# Patient Record
Sex: Female | Born: 1955 | Race: White | Hispanic: No | Marital: Married | Smoking: Never smoker
Health system: Southern US, Community
[De-identification: ages and names within clinical notes are randomized; demographics above are authoritative.]

## PROBLEM LIST (undated history)

## (undated) DIAGNOSIS — K5792 Diverticulitis of intestine, part unspecified, without perforation or abscess without bleeding: Secondary | ICD-10-CM

## (undated) DIAGNOSIS — E871 Hypo-osmolality and hyponatremia: Secondary | ICD-10-CM

## (undated) DIAGNOSIS — E2740 Unspecified adrenocortical insufficiency: Secondary | ICD-10-CM

## (undated) DIAGNOSIS — I10 Essential (primary) hypertension: Secondary | ICD-10-CM

## (undated) DIAGNOSIS — R1310 Dysphagia, unspecified: Secondary | ICD-10-CM

## (undated) DIAGNOSIS — Z8719 Personal history of other diseases of the digestive system: Secondary | ICD-10-CM

## (undated) DIAGNOSIS — K219 Gastro-esophageal reflux disease without esophagitis: Secondary | ICD-10-CM

## (undated) HISTORY — PX: CHOLECYSTECTOMY: SHX55

## (undated) HISTORY — PX: ABDOMINAL HYSTERECTOMY: SHX81

## (undated) HISTORY — DX: Personal history of other diseases of the digestive system: Z87.19

## (undated) HISTORY — PX: TUBAL LIGATION: SHX77

## (undated) HISTORY — DX: Gastro-esophageal reflux disease without esophagitis: K21.9

## (undated) HISTORY — PX: APPENDECTOMY: SHX54

---

## 1994-07-15 HISTORY — PX: SPINAL CORD DECOMPRESSION: SHX97

## 1998-01-09 ENCOUNTER — Other Ambulatory Visit: Admission: RE | Admit: 1998-01-09 | Discharge: 1998-01-09 | Payer: Self-pay | Admitting: Obstetrics and Gynecology

## 1998-08-08 ENCOUNTER — Encounter: Payer: Self-pay | Admitting: Emergency Medicine

## 1998-08-08 ENCOUNTER — Inpatient Hospital Stay (HOSPITAL_COMMUNITY): Admission: EM | Admit: 1998-08-08 | Discharge: 1998-08-14 | Payer: Self-pay | Admitting: Emergency Medicine

## 1998-08-09 ENCOUNTER — Encounter: Payer: Self-pay | Admitting: Family Medicine

## 1998-08-09 ENCOUNTER — Encounter: Payer: Self-pay | Admitting: Emergency Medicine

## 1998-08-12 ENCOUNTER — Encounter: Payer: Self-pay | Admitting: Family Medicine

## 1998-08-16 ENCOUNTER — Encounter: Admission: RE | Admit: 1998-08-16 | Discharge: 1998-08-16 | Payer: Self-pay | Admitting: Family Medicine

## 1999-06-07 ENCOUNTER — Emergency Department (HOSPITAL_COMMUNITY): Admission: EM | Admit: 1999-06-07 | Discharge: 1999-06-07 | Payer: Self-pay | Admitting: Emergency Medicine

## 1999-06-07 ENCOUNTER — Encounter: Payer: Self-pay | Admitting: Emergency Medicine

## 1999-10-23 ENCOUNTER — Encounter: Payer: Self-pay | Admitting: Obstetrics and Gynecology

## 1999-10-23 ENCOUNTER — Ambulatory Visit (HOSPITAL_COMMUNITY): Admission: RE | Admit: 1999-10-23 | Discharge: 1999-10-23 | Payer: Self-pay | Admitting: Obstetrics and Gynecology

## 1999-11-09 ENCOUNTER — Encounter (INDEPENDENT_AMBULATORY_CARE_PROVIDER_SITE_OTHER): Payer: Self-pay | Admitting: Specialist

## 1999-11-09 ENCOUNTER — Other Ambulatory Visit: Admission: RE | Admit: 1999-11-09 | Discharge: 1999-11-09 | Payer: Self-pay | Admitting: Obstetrics and Gynecology

## 2000-02-05 ENCOUNTER — Ambulatory Visit (HOSPITAL_COMMUNITY): Admission: RE | Admit: 2000-02-05 | Discharge: 2000-02-05 | Payer: Self-pay | Admitting: Obstetrics and Gynecology

## 2000-02-05 ENCOUNTER — Encounter (INDEPENDENT_AMBULATORY_CARE_PROVIDER_SITE_OTHER): Payer: Self-pay | Admitting: Specialist

## 2000-04-01 ENCOUNTER — Inpatient Hospital Stay (HOSPITAL_COMMUNITY): Admission: RE | Admit: 2000-04-01 | Discharge: 2000-04-03 | Payer: Self-pay | Admitting: Gynecology

## 2000-04-01 ENCOUNTER — Encounter (INDEPENDENT_AMBULATORY_CARE_PROVIDER_SITE_OTHER): Payer: Self-pay

## 2001-11-15 ENCOUNTER — Encounter: Payer: Self-pay | Admitting: Emergency Medicine

## 2001-11-15 ENCOUNTER — Encounter: Payer: Self-pay | Admitting: General Surgery

## 2001-11-15 ENCOUNTER — Emergency Department (HOSPITAL_COMMUNITY): Admission: EM | Admit: 2001-11-15 | Discharge: 2001-11-15 | Payer: Self-pay | Admitting: Emergency Medicine

## 2001-11-25 ENCOUNTER — Other Ambulatory Visit: Admission: RE | Admit: 2001-11-25 | Discharge: 2001-11-25 | Payer: Self-pay | Admitting: Gynecology

## 2001-11-30 ENCOUNTER — Encounter: Payer: Self-pay | Admitting: Gynecology

## 2001-11-30 ENCOUNTER — Encounter: Admission: RE | Admit: 2001-11-30 | Discharge: 2001-11-30 | Payer: Self-pay | Admitting: Gynecology

## 2001-12-01 ENCOUNTER — Encounter: Payer: Self-pay | Admitting: Gynecology

## 2001-12-01 ENCOUNTER — Encounter: Admission: RE | Admit: 2001-12-01 | Discharge: 2001-12-01 | Payer: Self-pay | Admitting: Gynecology

## 2002-05-17 ENCOUNTER — Encounter: Payer: Self-pay | Admitting: Emergency Medicine

## 2002-05-17 ENCOUNTER — Emergency Department (HOSPITAL_COMMUNITY): Admission: EM | Admit: 2002-05-17 | Discharge: 2002-05-17 | Payer: Self-pay | Admitting: Emergency Medicine

## 2003-05-05 ENCOUNTER — Emergency Department (HOSPITAL_COMMUNITY): Admission: EM | Admit: 2003-05-05 | Discharge: 2003-05-05 | Payer: Self-pay | Admitting: Emergency Medicine

## 2004-10-01 ENCOUNTER — Other Ambulatory Visit: Admission: RE | Admit: 2004-10-01 | Discharge: 2004-10-01 | Payer: Self-pay | Admitting: Obstetrics and Gynecology

## 2004-10-01 ENCOUNTER — Encounter: Admission: RE | Admit: 2004-10-01 | Discharge: 2004-10-01 | Payer: Self-pay | Admitting: Family Medicine

## 2005-05-09 ENCOUNTER — Ambulatory Visit: Payer: Self-pay | Admitting: Family Medicine

## 2005-05-18 ENCOUNTER — Emergency Department (HOSPITAL_COMMUNITY): Admission: EM | Admit: 2005-05-18 | Discharge: 2005-05-18 | Payer: Self-pay | Admitting: Emergency Medicine

## 2005-05-20 ENCOUNTER — Ambulatory Visit: Payer: Self-pay | Admitting: Family Medicine

## 2005-10-14 ENCOUNTER — Ambulatory Visit: Payer: Self-pay | Admitting: Family Medicine

## 2005-12-19 ENCOUNTER — Ambulatory Visit: Payer: Self-pay | Admitting: Family Medicine

## 2005-12-24 ENCOUNTER — Ambulatory Visit: Payer: Self-pay | Admitting: Family Medicine

## 2005-12-25 ENCOUNTER — Ambulatory Visit: Payer: Self-pay | Admitting: Family Medicine

## 2010-09-20 ENCOUNTER — Emergency Department (HOSPITAL_COMMUNITY): Payer: BC Managed Care – PPO

## 2010-09-20 ENCOUNTER — Emergency Department (HOSPITAL_COMMUNITY)
Admission: EM | Admit: 2010-09-20 | Discharge: 2010-09-20 | Disposition: A | Discharge status: Home / Self Care | Payer: BC Managed Care – PPO | Attending: Emergency Medicine | Admitting: Emergency Medicine

## 2010-09-20 DIAGNOSIS — R0989 Other specified symptoms and signs involving the circulatory and respiratory systems: Secondary | ICD-10-CM | POA: Insufficient documentation

## 2010-09-20 DIAGNOSIS — I200 Unstable angina: Secondary | ICD-10-CM | POA: Insufficient documentation

## 2010-09-20 DIAGNOSIS — R0609 Other forms of dyspnea: Secondary | ICD-10-CM | POA: Insufficient documentation

## 2010-09-20 DIAGNOSIS — R079 Chest pain, unspecified: Secondary | ICD-10-CM | POA: Insufficient documentation

## 2010-09-20 LAB — BASIC METABOLIC PANEL
BUN: 6 mg/dL (ref 6–23)
CO2: 30 mEq/L (ref 19–32)
Calcium: 9.2 mg/dL (ref 8.4–10.5)
Chloride: 97 mEq/L (ref 96–112)
Creatinine, Ser: 0.63 mg/dL (ref 0.4–1.2)
GFR calc Af Amer: >60 mL/min (ref >60)
GFR calc non Af Amer: >60 mL/min (ref >60)
Glucose, Bld: 323 mg/dL — ABNORMAL HIGH (ref 70–99)
Potassium: 3.6 mEq/L (ref 3.5–5.1)
Sodium: 134 mEq/L — ABNORMAL LOW (ref 135–145)

## 2010-09-20 LAB — POCT CARDIAC MARKERS
CKMB, poc: 1 ng/mL (ref 1.0–8.0)
CKMB, poc: <1 ng/mL — ABNORMAL LOW (ref 1.0–8.0)
Myoglobin, poc: 55.7 ng/mL (ref 12–200)
Myoglobin, poc: 62.2 ng/mL (ref 12–200)
Troponin i, poc: <0.05 ng/mL (ref 0.00–0.09)
Troponin i, poc: <0.05 ng/mL (ref 0.00–0.09)

## 2010-09-20 LAB — CBC
HCT: 38.1 % (ref 36.0–46.0)
Hemoglobin: 13.6 g/dL (ref 12.0–15.0)
MCH: 29.3 pg (ref 26.0–34.0)
MCHC: 35.7 g/dL (ref 30.0–36.0)
MCV: 82.1 fL (ref 78.0–100.0)
Platelets: 261 10*3/uL (ref 150–400)
RBC: 4.64 MIL/uL (ref 3.87–5.11)
RDW: 13.3 % (ref 11.5–15.5)
WBC: 7.7 10*3/uL (ref 4.0–10.5)

## 2010-09-20 LAB — DIFFERENTIAL
Basophils Absolute: 0 10*3/uL (ref 0.0–0.1)
Basophils Relative: 1 % (ref 0–1)
Eosinophils Absolute: 0.2 10*3/uL (ref 0.0–0.7)
Eosinophils Relative: 2 % (ref 0–5)
Lymphocytes Relative: 23 % (ref 12–46)
Lymphs Abs: 1.8 10*3/uL (ref 0.7–4.0)
Monocytes Absolute: 0.4 10*3/uL (ref 0.1–1.0)
Monocytes Relative: 5 % (ref 3–12)
Neutro Abs: 5.4 10*3/uL (ref 1.7–7.7)
Neutrophils Relative %: 69 % (ref 43–77)

## 2010-09-20 LAB — APTT: aPTT: 25 seconds (ref 24–37)

## 2010-09-20 LAB — PROTIME-INR
INR: 0.88 (ref 0.00–1.49)
Prothrombin Time: 12.1 seconds (ref 11.6–15.2)

## 2010-09-28 NOTE — Consult Note (Signed)
NAME:  Cassie Hunter, Cassie Hunter               ACCOUNT NO.:  [id number]  MEDICAL RECORD NO.:  [id number]           PATIENT TYPE:  LOCATION:                                 FACILITY:  PHYSICIAN:  Macarena Langseth C. Gladstone Rosas, MD, FACCDATE OF BIRTH:  03-May-1956  DATE OF CONSULTATION: DATE OF DISCHARGE:                                CONSULTATION   DATE OF CONSULTATION:  September 20, 2010.  PRIMARY CARDIOLOGIST:  New patient to [facility], currently being evaluated by Dr. Daleen Squibb.  PRIMARY CARE PROVIDER:  Delaney Meigs, M.D.  REASON FOR CONSULTATION:  Chest pain.  HISTORY OF PRESENT ILLNESS:  This is a 55 year old Caucasian female without known history of hypertension, coronary artery disease, or diabetes mellitus who was in her usual state of health until the morning of admission.  She states she was awakened from her sleep by her daughter screaming.  She ran down a short hallway to see if she was okay.  By the time she reached her daughter's room, she had a sharp, left, stabbing sensation in her chest.  The patient walked back to her room and lied down for approximately 45 minutes where she was able to go back to sleep.  Her alarm woke her again to go to work.  The patient remains with left-sided stabbing chest pain.  She had no associated symptoms of shortness of breath, nausea, vomiting, or diaphoresis.  She continued to work, and her pain had been ongoing for approximately 4 hours when she went to talk to the nurse.  At this time, it was noted that her pressure was 158/88.  With the chest pain still constant, patient called her daughter to bring her to the emergency department. In the emergency department, the patient's chest pain remained. She was placed on heparin and nitroglycerin drip which eased her chest pain some, but she still remains with sharp left-sided pain.  The patient states that she has not experienced this pain before.  EKG is without acute changes.  Point-of-care markers are  negative.  Cardiology was asked to evaluate the patient for further recommendations.  PAST MEDICAL HISTORY: 1. Diverticulitis. 2. Status post appendectomy. 3. Status post spinal cord surgery. 4. Status post cholecystectomy. 5. Status post total hysterectomy.  SOCIAL HISTORY:  The patient lives in [place] with her husband and two children.  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  She denies any tobacco or alcohol use.  She used to take daily walks, with this, this subsided some.  FAMILY HISTORY:  Pertinent for coronary artery disease and CVA in her mother at the age of 80 who has deceased.  Her father passed away at the age of 53 from pancreatic cancer.  She has no known early coronary artery disease.  ALLERGIES:  CODEINE causing nausea.  HOME MEDICATIONS: 1. Ibuprofen as needed for back pain. 2. Multivitamin.  REVIEW OF SYSTEMS:  All pertinent positives and negatives as stated in HPI.  All other systems have been reviewed and are negative.  PHYSICAL EXAMINATION:  VITAL SIGNS:  Pulse 79, respirations 18, blood pressure 129/85, and O2 saturation  98% on room air. GENERAL:  This is a well-developed, well-nourished middle-aged female. She is in no acute distress. HEENT:  Normal. NECK:  Supple without bruit or JVD. HEART:  Regular rate and rhythm with S1 and S2.  No murmur, rub, or gallop noted.  PMI is normal.  Pulses are 2+ and equal bilaterally. LUNGS:  Clear to auscultation bilaterally without wheezes, rales, or rhonchi. ABDOMEN:  Soft, nontender, positive bowel sounds x4. EXTREMITIES:  No clubbing, cyanosis, or edema. MUSCULOSKELETAL:  No joint deformities or effusions. NEUROLOGIC:  Alert and oriented x3.  Cranial nerves II-XII grossly intact.  IMAGING:  Chest x-ray showing no acute cardiopulmonary findings.  EKG showing sinus rhythm with a rate of 83 beats per minute.  There are no acute ischemic changes.  There was noted to be low voltage  throughout precordial leads secondary to body habitus.  LABORATORY DATA:  WBC is 7.7, hemoglobin 13.6, hematocrit 38.1, and platelets 261.  Sodium 134, potassium 3.6, chloride 97, bicarb 30, BUN 6, creatinine 0.63, and glucose 323.  Point-of-care markers negative x1.  ASSESSMENT/PLAN:  This is a 55 year old female who presents with constant chest pain that is atypical for cardiac etiology.  At this time, patient remains stable, and it is felt that she does not need to be admitted for her noncardiac chest pain.  Of note, the patient has been found to have newly diagnosed hypertension with initial pressures running systolics in the 150s.  She also has newly diagnosed diabetes mellitus type 2 with fasting blood sugar of 323.  Dr. Daleen Squibb has discussed thoroughly a low-carbohydrate diet with the patient.  He has also discussed no salt as well as a weight loss regimen.  Followup plans have also been discussed with Dr. Lysbeth Galas.  He has asked that the patient be placed on Glucotrol XL 5 mg daily and have a followup appointment with him scheduled on Monday, March 12 at 8:30 a.m. Followup plans and instructions have been discussed with the patient and her daughter, they voiced understanding.  Thank you for this consultation.  Currently, there is no need to follow up with Dr. Daleen Squibb, this can be done on an as-needed basis.     Leonette Monarch, PA-C   ______________________________ Jesse Sans Daleen Squibb, MD, [facility]    NB/MEDQ  D:  09/20/2010  T:  09/21/2010  Job:  [id number]  cc:   Delaney Meigs, M.D.  Electronically Signed by Alen Blew P.A. on 09/24/2010 12:54:39 PM Electronically Signed by Valera Castle MD [facility] on 09/28/2010 09:18:23 AM

## 2010-11-30 NOTE — Op Note (Signed)
[facility]  Patient:    Cassie Hunter, Cassie Hunter               MRN: [id number] Proc. Date: 04/01/00 Adm. Date:  [id number] Attending:  Katrina Stack CC:         Delaney Meigs, M.D.   Operative Report  PREOPERATIVE DIAGNOSES: 1. Abnormal genital cytology, persistence after CKC. 2. 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  POSTOPERATIVE DIAGNOSES: 1. Abnormal genital cytology, persistence of after CKC. 2. 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  PROCEDURE:  Abdominal hysterectomy and bilateral salpingo-oophorectomy.  SURGEON:  Gretta Cool, M.D.  ASSISTANT:  Raynald Kemp, M.D.  ANESTHESIA:  General endotracheal.  DESCRIPTION OF PROCEDURE:  Under excellent general anesthesia as above, with the patients abdomen prepped and draped as a sterile field, a Pfannenstiel incision was made by excision of her previous two cesarean section scars.  The incision was then extended through the fascia, the peritoneum opened, and the abdomen explored.  There were no abnormalities identified in the upper abdomen.  Gallbladder and appendix had been previously removed.  Both kidneys were normal.  Examination in the pelvis revealed a large uterine fundus with marked advancement of the bladder onto the anterior wall of the uterus secondary to the previous cesarean section deliveries.  Moderate adhesions of both fallopian tubes to ovaries at the site of previous tubal ligation.  There was no evidence of endometriosis or other significant pelvic pathology.  Both ovaries had very short ovarian ligaments that were sufficiently short so as to compromise the ovary on both sides.  Because of the close proximity, a decision was made to proceed with bilateral salpingo-oophorectomy, particularly in view of the patients complaint of incapacitating pain.  At this point, the round ligaments were transected  by cautery, the anterior leaf of the broad ligament opened, and the bladder dissected off the lower uterine segment.  The infundibulopelvic vessels were then skeletonized, clamped, cut, sutured, and tied with 0 Vicryl.  They were then doubly ligated with a second tie of 0 Vicryl.  The uterine vessels were then skeletonized, clamped, cut, sutured, and tied with 0 Vicryl.  Cardinal and uterosacral ligaments were then also clamped, cut, sutured, and tied with 0 Vicryl.  The vagina was then entered and the cervix excised.  The cuff was then closed with a running suture of 0 Vicryl.  Interrupted sutures of 0 Ethibond were then used to secure the cardinal and uterosacral ligaments to the vaginal cuff laterally. At this point, the examination of the pedicles revealed no significant bleeding.  The pelvic peritoneum was then approximated with a running suture of 2-0 Monocryl.  The packs were then removed and the omentum pulled down over the bowel.  At this point, the abdominal peritoneum was closed with a running suture of 0 Monocryl.  The rectus muscles were then plicated in the midline, also with 0 Monocryl.  The fascia was then approximated with a running suture of 0 Vicryl from each angle to the midline and the subcutaneous tissues approximated with running sutures of 3-0 Vicryl.  Skin was closed with skin staples and Steri-Strips.  At the end of the procedure, sponge and lap counts were correct.  There were no complications.  The patient returned to the recovery room in excellent condition. DD:  04/01/00 TD:  04/02/00 Job: [id number]

## 2010-11-30 NOTE — Discharge Summary (Signed)
[facility]  Patient:    Cassie Hunter, Cassie Hunter                        MRN: [id number] Adm. Date:  04/01/00 Disc. Date: 04/03/00 Attending:  Gretta Cool, M.D. Dictator:   Jeani Sow, R.N., F.N.P. CC:         Cassie Hunter, M.D.  Cassie Hunter, M.D.   Discharge Summary  HISTORY OF PRESENT ILLNESS:  Ms. Cassie Hunter is a 55 year old white married female gravida 2, para 2 referred by Dr. Christie Beckers for consideration of hysterectomy. 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 She has difficult anatomy making it difficult to reach her cervix and assess this abnormality. She also reports history of severe and intractable dysmenorrhea and dyspareunia. She describes her cycles as severely uncomfortable with onset lasting at least halfway through the cycle and is particularly severe in her right lower quadrant. She also reports some discomfort mid cycle. The symptoms have become increasingly severe and now presents for definitive therapy by hysterectomy and possible salpingo-oophorectomy.  ADMISSION EXAMINATION:  CHEST:  Clear to A&P.  HEART:  Rate and rhythm were regular without murmur, gallop or cardiac enlargement.  ABDOMEN:  Soft and scaphoid without masses or organomegaly.  PELVIC:  External genitalia within normal limits for female. Vagina clean and rugose. Cervix is nulliparous and clean post conization. The uterus is moderately tender to direct examination, normal size. Adnexa bilaterally clear. Rectovaginal exam confirms.  IMPRESSION: 1. Abnormal genital cytology persistent on post cone excision with positive    margins--cervical intraepithelial neoplasia grade 3. 2. Severe dysmenorrhea characteristic of adenomyosis or endometriosis. 3. Dyspareunia.  PLAN:  Definitive therapy by hysterectomy and possible salpingo-oophorectomy. Risks and benefits have been discussed  with the procedures and she accepts them.  LABORATORY DATA:  Admission hemoglobin 12.1, hematocrit 35.7. On the first postoperative day, hemoglobin was 10.1, hematocrit 30.1.  HOSPITAL COURSE:  The patient underwent abdominal hysterectomy, bilateral salpingo-oophorectomy under general anesthesia. The procedure was completed without any complications and the patient was returned to the recovery room in excellent condition. Pathology reported revealed cervical fibrosis and foreign body reaction, no residual dysplasia identified, cervical endometriosis, benign secretory endometrium, leiomyomata uteri submucosa, intramural and subserosa, fallopian tube with adenomatoid tumor, bilateral ovaries with no pathological abnormalities identified. Her postoperative course was without complications and she was discharged on the second postoperative day in excellent condition.  FINAL DISCHARGE INSTRUCTIONS:  No heavy lifting or straining, no vaginal entrance, increased ambulation as tolerated. She is to call for any fever of over 100.5 of failure or daily improvement.  DISCHARGE DIET:  Regular.  DISCHARGE MEDICATIONS: 1. Tylox 1 p.o. q. 4h p.r.n. discomfort. 2. Vioxx 25 mg q.d.  FOLLOW-UP:  She is to return to the office in 1 week for follow-up.  CONDITION ON DISCHARGE:  Excellent.  FINAL DISCHARGE DIAGNOSES: 1. Abnormal genital cytology persistent after CKC. 2. 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 3. Cervical endometriosis. 4. Leiomyomata uteri submucosal intramural and subserosal.  PROCEDURES PERFORMED:  Total abdominal hysterectomy, bilateral salpingo-oophorectomy under general anesthesia. DD:  05/05/00 TD:  05/05/00 Job: [id number]

## 2010-11-30 NOTE — Op Note (Signed)
[facility]  Patient:    Cassie Hunter, Cassie Hunter                      MRN: [id number] Proc. Date: 02/05/00 Adm. Date:  [id number] Disc. Date: [id number] Attending:  Amanda Cockayne CC:         Esmeralda Arthur, M.D.                           Operative Report  SURGEON:                      Esmeralda Arthur, M.D.  ANESTHESIA:                   General.  PACKS:                        One piece of Surgicel in the cervix measuring                               2.0 x 14.0.  OPERATION PERFORMED:          1. Conization of the cervix, dilatation                                  and curettage, polypectomy of the uterus.  2. Colposcopy and laser of the cervix.  MEDIUM:                       Sorbitol.  ESTIMATED BLOOD LOSS:         150 cc.  FINDINGS:                     Negative-appearing cervix that we coned.  She had in the office an area above, and white epithelium which we destroyed with laser, after staining with acetic acid and looking it with the colposcope.  PHYSICAL EXAMINATION:         She had polyps in the uterus which were removed. We did a cold knife conization.  DESCRIPTION OF PROCEDURE:     The patient came to the operating room, and after  satisfactory general anesthesia, with the patient in the lithotomy position, was prepped and draped in a sterile field.  Examination revealed the uterus to feel  midplane.  I could feel no masses.  A weighted speculum was placed in the posterior vagina and the cervix grasped at 9 and 3 oclock with a single toothed tenaculum. We then looked at the top of the cervix and stained it with vinegar and she still turned white, which was above the area of conization.  We injected the cervix with Pitressin and saline.  I then made an incision from 6 oclock to 12 oclock, going counterclockwise from 6 oclock to 12 oclock going clockwise.  I then grasped this with two Allis clamps and finished the  cone with scissors.  We then  dilated the cervix to a #25 and looked in with the hysteroscope.  The opening to the tubal ________ were clear.  She had what appeared to be multiple polyps, or several polyps.  We then examined with the Randall stone grasping forceps, removing one  polyp and pieces of others.  We then did a curettage and  got a fair amount of tissue.  We then did some cautery of the endocervix to decrease the bleeding.  We then hooked up to the colposcope and focused in.  She had a rather extensive area of  white epithelium.  This was then lasered.  Her previous biopsy of the white epithelium had shown no dysplasia.  After this we then went back and cauterized the endocervix.  We spent quite a bit of time and stopped the bleeding.  We had the  bleeding under control.  We put a suture in from 1 to 5 oclock on the left side, and from 11 to 5 oclock on her right side.  I then had adequate hemostasis and ut the Surgicel in.  The procedure was terminated.  She was carried to the recovery room in good condition. DD:  02/05/00 TD:  02/07/00 Job: [id number]

## 2010-11-30 NOTE — H&P (Signed)
[facility]  Patient:    Cassie Hunter, Cassie Hunter                        MRN: [id number] Attending:  Gretta Cool, M.D. CC:         Delaney Meigs, M.D., [place], [place]  Esmeralda Arthur, M.D.   History and Physical  [facility] MEDICAL RECORD NUMBER:  [id number].  CHIEF COMPLAINT 1. CIN-3 with extension to the margin of resection at previous cold-knife    conization, February 05, 2000. 2. Severe intractable dysmenorrhea and dyspareunia.  HISTORY OF PRESENT ILLNESS:  [age]-year-old white married gravida 2, para 2, referred through the courtesy of Dr. Lacretia Nicks. Christie Beckers for consideration for hysterectomy.  She has a history of abnormal cytology, CIN-3, with extension to the margin of resection, February 05, 2000.  She has very difficult anatomy, in that her cervix is very hard to reach and to access.  She also has a history of severe and intractable dysmenorrhea and dyspareunia.  She characterized her cycles as severely uncomfortable with onset of menses, lasting halfway through her cycle.  Her pain is particularly severe in her right lower quadrant; she also describes some discomfort, midcycle.  She denies any significant discomfort in the first half of her menstrual cycle beyond menses.  Her symptoms have become increasing severe and intractable. She has a previous history of laparoscopy by another physician in [employer], with no evidence of significant endometriosis noted.  She is now admitted for definitive therapy by hysterectomy and possible salpingo-oophorectomy.  She understands the risks and benefits of the procedure, explained to her in detail; she also understands the process for the procedure and management of pain and recovery, now admitted for definitive therapy.  PAST MEDICAL HISTORY:  Usual childhood diseases without sequelae.  MEDICAL ILLNESSES:  History of tonsillectomy, 1971; appendectomy, 1972; hospitalized for mononucleosis,  1971; laparoscopy by Dr. Cleotis Nipper, 1983; knee surgery by Dr. Maisie Fus L. Presson; cesarean section by Dr. Katherine Roan x 2; laparoscopy, 1992, by Dr. Elana Alm; spinal cord surgery, Dr. Dorina Hoyer. Fuchs, 1996; cholecystectomy, 1999; D&C and conization, 2001, by Dr. Katrinka Blazing.  PRESENT MEDICATIONS:  None.  ALLERGIES:  CODEINE causes nausea.  FAMILY HISTORY:  Father died of lung cancer, age 1.  Mother has significant cardiovascular disease at age 43.  One sister died of spinal bifida.  No other known familial tendencies.  SOCIAL HISTORY:  Patient is a Runner, broadcasting/film/video at [employer], [place], [place].  Husband is a Corporate investment banker.  Two children living in the home.  REVIEW OF SYSTEMS:  Denies symptoms.   CARDIORESPIRATORY:  Denies asthma, cough, bronchitis, shortness of breath.  GI/GU:  Denies frequency, urgency, dysuria, change in bowel habits, food intolerance.  PHYSICAL EXAMINATION  GENERAL:  Well-developed, well-nourished white female considerably over ideal weight with high abdomen-to-hip ratio.  Height 5 feet 2-1/2 inches.  Weight 175 pounds.  VITAL SIGNS:  Blood pressure 120/80.  HEENT:  Pupils equal, react to light and accommodation.  Fundi not examined. Oropharynx clear.  NECK:  Supple without mass or thyroid enlargement.  CHEST:  Clear P-to-A.  HEART:  Regular rhythm without murmur or cardiac enlargement.  BREASTS:  Soft without mass, nodes or nipple discharge.  ABDOMEN:  Soft, scaphoid, without mass or organomegaly.  PELVIC:  External genitalia:  Normal female.  Vagina clean, rugose.  Cervix is nulliparous in appearance, post conization.  It is exceedingly difficult to access with the speculum and does  not distend well secondary to previous conization and to cesarean section deliveries.  Uterus is moderately tender to direct examination, upper limits of normal size.  Adnexa clear.  Rectovaginal exam confirms.  IMPRESSIONS 1. Abnormal genital cytology  persistent post cone excision with positive    margins, cervical intraepithelial neoplasia, grade 3. 2. Severe dysmenorrhea characteristic of adenomyosis or endometriosis. 3. Dyspareunia.  PLAN:  I have discussed with the patient alternative management possibilities, medical and surgical.  She has elected definitive therapy as above by hysterectomy and possible salpingo-oophorectomy.  She understands the possibility of persistence of pain; she also understands the risks and benefits of the procedure, alternatives in detail, now admitted for definitive therapy. DD:  03/31/00 TD:  04/01/00 Job: [id number]

## 2010-11-30 NOTE — H&P (Signed)
[facility]  Patient:    Cassie Hunter, Cassie Hunter                        MRN: [id number] Attending:  Esmeralda Arthur, M.D.                         History and Physical  OFFICE CHART #: [id number].  HISTORY OF PRESENT ILLNESS:  This is a 55 year old female, PII/II, who is admitted to the hospital for cold knife cone, hysteroscopy and laser of the cervix.  The  patient has had an abnormal Pap smear in our office on 11/02/99.  She had a colposcopy and had some white epithelium; we biopsied the area, which did not show any hypoplasia and did not show any dysplasia.  She had a normal Pap smear the ear before, and we have only a few Pap smears on her.  She has not had an abnormal ap smear to her knowledge.  Because of unable to correlate a Class 3 Pap smear with a colposcopy, she is for cold knife cone and to laser area after biopsy of dysplasia out of the reach of the cone.  The white epithelium that we biopsied went up into the cervix and so she had really an unsatisfactory colposcopy.  We will wait until after she had her period to have this.  Her last period was on 01/26/00.  Her last Pap smear was an abnormal Pap smear in April, 2001.  HABITS:  She does not smoke.  She exercises regular.  She has good nutrition.  ALLERGIES:  CODEINE.  REVIEW OF SYSTEMS:  She has no problem with head, eyes, ears, nose or throat.  CARDIOVASCULAR:  Negative.  RESPIRATORY:  Negative.  GENITOURINARY:  Negative.  SKIN/BREASTS:  Negative.  PERSONAL PAST HISTORY:  She has had no problems with her heart or lungs.  She has never had an abnormal Pap smear.  FAMILY HISTORY:  Mother and an aunt had diabetes.  Maternal aunt had ovarian cancer.  Her grandparents had heart disease.  She has a history of high blood pressure.  SURGERIES:  She had a tonsillectomy, appendectomy, osteotomy and laparoscopy twice. C-section twice.  She had spinal cord surgery and gallbladder,  and she had a tubal ligation.  PHYSICAL EXAMINATION:  GENERAL:  Well-developed, well-nourished female, oriented and alert.  VITAL SIGNS:  Blood pressure 120/80.  Height 5 ft 2-1/2 in.  Weight 175.  HEART:  Reveals a normal sinus rhythm.  LUNGS:  Clear to percussion and auscultation.  BREASTS:  Type 4, without masses.  ABDOMEN:  Liver not enlarged; spleen not enlarged.  PELVIC:  She has good support.  Cervix is epithelialized.  Uterus feels slightly enlarged.  Ovaries not palpable.  No mass felt in the adnexa.  IMPRESSION:  CIN-III on Pap smear; negative [but unsatisfactory] colposcopy. Perimenopausal; on hormone replacement therapy.  DISPOSITION:  Admitted for cold knife cone, hysteroscopy and laser of the cervix. DD:  02/04/00 TD:  02/04/00 Job: [id number]

## 2012-01-21 ENCOUNTER — Other Ambulatory Visit: Payer: Self-pay | Admitting: Family Medicine

## 2012-01-21 DIAGNOSIS — Z1231 Encounter for screening mammogram for malignant neoplasm of breast: Secondary | ICD-10-CM

## 2012-02-04 ENCOUNTER — Emergency Department (HOSPITAL_COMMUNITY): Payer: BC Managed Care – PPO

## 2012-02-04 ENCOUNTER — Emergency Department (HOSPITAL_COMMUNITY)
Admission: EM | Admit: 2012-02-04 | Discharge: 2012-02-05 | Disposition: A | Discharge status: Home / Self Care | Payer: BC Managed Care – PPO | Attending: Emergency Medicine | Admitting: Emergency Medicine

## 2012-02-04 ENCOUNTER — Encounter (HOSPITAL_COMMUNITY): Payer: Self-pay | Admitting: Emergency Medicine

## 2012-02-04 DIAGNOSIS — K2289 Other specified disease of esophagus: Secondary | ICD-10-CM

## 2012-02-04 DIAGNOSIS — R079 Chest pain, unspecified: Secondary | ICD-10-CM | POA: Insufficient documentation

## 2012-02-04 DIAGNOSIS — R1319 Other dysphagia: Secondary | ICD-10-CM | POA: Insufficient documentation

## 2012-02-04 DIAGNOSIS — R1310 Dysphagia, unspecified: Secondary | ICD-10-CM | POA: Insufficient documentation

## 2012-02-04 DIAGNOSIS — Z9089 Acquired absence of other organs: Secondary | ICD-10-CM | POA: Insufficient documentation

## 2012-02-04 DIAGNOSIS — Z8719 Personal history of other diseases of the digestive system: Secondary | ICD-10-CM

## 2012-02-04 DIAGNOSIS — K5792 Diverticulitis of intestine, part unspecified, without perforation or abscess without bleeding: Secondary | ICD-10-CM | POA: Insufficient documentation

## 2012-02-04 DIAGNOSIS — Z9889 Other specified postprocedural states: Secondary | ICD-10-CM | POA: Insufficient documentation

## 2012-02-04 DIAGNOSIS — K228 Other specified diseases of esophagus: Secondary | ICD-10-CM

## 2012-02-04 DIAGNOSIS — E119 Type 2 diabetes mellitus without complications: Secondary | ICD-10-CM | POA: Insufficient documentation

## 2012-02-04 HISTORY — DX: Diverticulitis of intestine, part unspecified, without perforation or abscess without bleeding: K57.92

## 2012-02-04 HISTORY — DX: Dysphagia, unspecified: R13.10

## 2012-02-04 LAB — CBC WITH DIFFERENTIAL/PLATELET
Basophils Absolute: 0 10*3/uL (ref 0.0–0.1)
Basophils Relative: 0 % (ref 0–1)
Eosinophils Absolute: 0.1 10*3/uL (ref 0.0–0.7)
Eosinophils Relative: 1 % (ref 0–5)
HCT: 39.6 % (ref 36.0–46.0)
Hemoglobin: 13.4 g/dL (ref 12.0–15.0)
Lymphocytes Relative: 13 % (ref 12–46)
Lymphs Abs: 1.4 10*3/uL (ref 0.7–4.0)
MCH: 29.1 pg (ref 26.0–34.0)
MCHC: 33.8 g/dL (ref 30.0–36.0)
MCV: 86.1 fL (ref 78.0–100.0)
Monocytes Absolute: 0.7 10*3/uL (ref 0.1–1.0)
Monocytes Relative: 6 % (ref 3–12)
Neutro Abs: 8.5 10*3/uL — ABNORMAL HIGH (ref 1.7–7.7)
Neutrophils Relative %: 79 % — ABNORMAL HIGH (ref 43–77)
Platelets: 292 10*3/uL (ref 150–400)
RBC: 4.6 MIL/uL (ref 3.87–5.11)
RDW: 13.5 % (ref 11.5–15.5)
WBC: 10.7 10*3/uL — ABNORMAL HIGH (ref 4.0–10.5)

## 2012-02-04 LAB — COMPREHENSIVE METABOLIC PANEL
ALT: 12 U/L (ref 0–35)
AST: 18 U/L (ref 0–37)
Albumin: 3.8 g/dL (ref 3.5–5.2)
Alkaline Phosphatase: 103 U/L (ref 39–117)
BUN: 7 mg/dL (ref 6–23)
CO2: 28 mEq/L (ref 19–32)
Calcium: 9.4 mg/dL (ref 8.4–10.5)
Chloride: 101 mEq/L (ref 96–112)
Creatinine, Ser: 0.69 mg/dL (ref 0.50–1.10)
GFR calc Af Amer: >90 mL/min (ref >90)
GFR calc non Af Amer: >90 mL/min (ref >90)
Glucose, Bld: 156 mg/dL — ABNORMAL HIGH (ref 70–99)
Potassium: 3.7 mEq/L (ref 3.5–5.1)
Sodium: 140 mEq/L (ref 135–145)
Total Bilirubin: 0.6 mg/dL (ref 0.3–1.2)
Total Protein: 7.5 g/dL (ref 6.0–8.3)

## 2012-02-04 MED ORDER — HYDROMORPHONE HCL PF 1 MG/ML IJ SOLN
1.0000 mg | Freq: Once | INTRAMUSCULAR | Status: AC
  Administered 2012-02-04: 1 mg via INTRAVENOUS
  Filled 2012-02-04: qty 1

## 2012-02-04 MED ORDER — ONDANSETRON HCL 4 MG/2ML IJ SOLN
4.0000 mg | Freq: Once | INTRAMUSCULAR | Status: AC
  Administered 2012-02-04: 4 mg via INTRAVENOUS
  Filled 2012-02-04: qty 2

## 2012-02-04 NOTE — ED Notes (Signed)
Patient transported to X-ray 

## 2012-02-04 NOTE — ED Provider Notes (Addendum)
History     CSN: [id number]  Arrival date & time 02/04/12  2148   First MD Initiated Contact with Patient 02/04/12 2250      Chief Complaint  Patient presents with  . Chest Pain  . Sore Throat    (Consider location/radiation/quality/duration/timing/severity/associated sxs/prior treatment) Patient is a 56 y.o. female presenting with chest pain and pharyngitis. The history is provided by the patient.  Chest Pain The chest pain began 3 - 5 hours ago. Duration of episode(s) is 5 hours. Chest pain occurs constantly. The chest pain is unchanged. At its most intense, the pain is at 8/10. The pain is currently at 8/10. The severity of the pain is severe. The quality of the pain is described as aching and sharp. The pain radiates to the right jaw and right neck. Primary symptoms include nausea and vomiting.    Sore Throat This is a new problem. The current episode started 3 to 5 hours ago. The problem occurs constantly. The problem has not changed since onset.Associated symptoms include chest pain. The symptoms are aggravated by swallowing. Nothing relieves the symptoms. She has tried nothing for the symptoms.    Past Medical History  Diagnosis Date  . Diabetes mellitus   . Diverticulitis   . Difficulty swallowing     Past Surgical History  Procedure Date  . Appendectomy   . Cholecystectomy   . Abdominal hysterectomy     History reviewed. No pertinent family history.  History  Substance Use Topics  . Smoking status: Never Smoker   . Smokeless tobacco: Not on file  . Alcohol Use: No    OB History    Grav Para Term Preterm Abortions TAB SAB Ect Mult Living                  Review of Systems  Cardiovascular: Positive for chest pain.  Gastrointestinal: Positive for nausea and vomiting.  All other systems reviewed and are negative.    Allergies  Codeine  Home Medications   Current Outpatient Rx  Name Route Sig Dispense Refill  . GLIPIZIDE ER 2.5 MG PO TB24 Oral  Take 2.5 mg by mouth daily.    . IBUPROFEN 200 MG PO TABS Oral Take 800 mg by mouth every 6 (six) hours as needed. For pain    . SIMVASTATIN 20 MG PO TABS Oral Take 20 mg by mouth daily.      BP 160/96  Pulse 96  Temp 99.4 F (37.4 C) (Oral)  Resp 19  SpO2 98%  Physical Exam  Constitutional: She is oriented to person, place, and time. She appears well-developed and well-nourished.  HENT:  Head: Normocephalic and atraumatic.  Eyes: Conjunctivae and EOM are normal. Pupils are equal, round, and reactive to light.  Neck: Normal range of motion.  Cardiovascular: Normal rate, regular rhythm and normal heart sounds.   Pulmonary/Chest: Effort normal and breath sounds normal.  Abdominal: Soft. Bowel sounds are normal.  Musculoskeletal: Normal range of motion.  Neurological: She is alert and oriented to person, place, and time.  Skin: Skin is warm and dry.  Psychiatric: She has a normal mood and affect. Her behavior is normal.    ED Course  Procedures (including critical care time)   Labs Reviewed  CBC WITH DIFFERENTIAL  COMPREHENSIVE METABOLIC PANEL   Dg Chest 2 View  02/04/2012  *RADIOLOGY REPORT*  Clinical Data: Endoscopy today with esophageal dilatation.  Chest pain.  Right jaw pain.  CHEST - 2 VIEW  Comparison: 09/20/2010  Findings: No pneumomediastinum or pneumothorax observed.  The lungs appear clear. Cardiac and mediastinal contours appear unremarkable.  Lower thoracic and lumbar spondylosis noted.  No pleural effusion observed.  IMPRESSION:  1.  No acute radiographic findings. If the patient's chest pain and is felt to be atypical in the postoperative setting, then water soluble contrast swallowing exam would be recommended to rule out tear/leak.  Original Report Authenticated By: Dellia Cloud, M.D.     No diagnosis found.   Date: 02/04/2012  Rate: 91  Rhythm: normal sinus rhythm  QRS Axis: right  Intervals: normal  ST/T Wave abnormalities: nonspecific ST  changes  Conduction Disutrbances:incomplete rbbb  Narrative Interpretation: sinus,  Incomplete rbbb block    MDM  Endoscopy in this am,  No with chest/jaw pain,  Vomiting,  Concern for perforation.  Cxr neg.  Will esophagram per radiologist, reassess.  Will cdu per hayes request.  No perforation noted.        Deyona Soza Lytle Michaels, MD 02/04/12 2318  Arpita Fentress Lytle Michaels, MD 02/05/12 1610  Rosanne Ashing, MD 02/05/12 [phone]

## 2012-02-04 NOTE — ED Notes (Signed)
Pt ambulated to the restroom, tolerated well.

## 2012-02-04 NOTE — ED Notes (Addendum)
Patient states that she had an endoscopy today; had esophagus stretched.  Patient is now reporting mid-sternal chest pain, radiation up esophageal area, to her jaw, and right ear.  Patient describes pain as "sharp"; denies shortness of breath.  Patient feels that she is having a hard time swallowing; has been spitting up phlegm all day.  Reports one episode of emesis; blood-tinged.  Here to rule out esophageal stricture.

## 2012-02-05 ENCOUNTER — Ambulatory Visit: Payer: BC Managed Care – PPO

## 2012-02-05 LAB — GLUCOSE, CAPILLARY: Glucose-Capillary: 141 mg/dL — ABNORMAL HIGH (ref 70–99)

## 2012-02-05 MED ORDER — IOHEXOL 300 MG/ML  SOLN: 300.0000 mL | Freq: Once | INTRAMUSCULAR | Status: AC | PRN

## 2012-02-05 NOTE — ED Notes (Signed)
Pt reports throat pain that radiates to her chest, began approx s/p endoscopy procedure done today. Pt also reports difficulty swallowing, handling secretions well however is continuously spitting and c/o nausea. Pt states she was told she had to have dilatation of her esophagus during endoscopy today. Pt underwent endoscopy for difficulty swallowing. Pt A&Ox4 in no acute distress on assessment.

## 2012-02-05 NOTE — ED Notes (Signed)
cbg 141 

## 2012-02-05 NOTE — ED Provider Notes (Signed)
7:13 AM Assumed care of patient in the CDU.  Patient had endoscopy with esophageal dilation performed yesterday and came in last evening with a chief complaint of CP and sore throat.  Negative CXR.  Normal esophogram with no injury or perforation of the esophagus noted.  Patient is currently waiting for Dr. Madilyn Fireman who did her procedure yesterday to come see her in the CDU.  Reassessed patient.  No acute distress.  She states that she is still having pain of her throat at this time.  However, she states that she does not want any more pain medication.  No CP at this time.  Nausea controlled.  Heart RRR, Lungs CTAB, Oropharynx clear and moist, Abdomen soft and nontender.   8:11 AM Dr Madilyn Fireman has been in to see patient.  He feels that the patient is stable for discharge and can follow up with him in the office.  He has discussed return precautions with patient.  Will discharge patient home.  Patient declined prescription for pain medication.  Pascal Lux Eleele, PA-C 02/05/12 [phone]

## 2012-02-05 NOTE — ED Notes (Addendum)
Patient check

## 2012-02-05 NOTE — ED Provider Notes (Signed)
Medical screening examination/treatment/procedure(s) were conducted as a shared visit with non-physician practitioner(s) and myself.  I personally evaluated the patient during the encounter  Disposition appropriate, patient seen by specialist and esophogram without significant findings.      Shelda Jakes, MD 02/05/12 2008

## 2012-02-05 NOTE — ED Notes (Signed)
Han endoscopy with esophagus stretching. Pain radiating from jaw to ear and chest. Stabbing pain with swallowing. Rates pain as 6/10 with swallowing.

## 2012-02-07 ENCOUNTER — Ambulatory Visit: Payer: BC Managed Care – PPO

## 2012-02-11 ENCOUNTER — Other Ambulatory Visit: Payer: Self-pay | Admitting: Obstetrics and Gynecology

## 2012-02-19 ENCOUNTER — Other Ambulatory Visit: Payer: Self-pay | Admitting: Obstetrics and Gynecology

## 2012-02-19 DIAGNOSIS — R928 Other abnormal and inconclusive findings on diagnostic imaging of breast: Secondary | ICD-10-CM

## 2012-02-25 ENCOUNTER — Ambulatory Visit
Admission: RE | Admit: 2012-02-25 | Discharge: 2012-02-25 | Disposition: A | Discharge status: Home / Self Care | Payer: BC Managed Care – PPO | Source: Ambulatory Visit | Attending: Obstetrics and Gynecology | Admitting: Obstetrics and Gynecology

## 2012-02-25 DIAGNOSIS — R928 Other abnormal and inconclusive findings on diagnostic imaging of breast: Secondary | ICD-10-CM

## 2012-08-05 ENCOUNTER — Other Ambulatory Visit: Payer: Self-pay | Admitting: Obstetrics and Gynecology

## 2012-08-05 DIAGNOSIS — R921 Mammographic calcification found on diagnostic imaging of breast: Secondary | ICD-10-CM

## 2012-08-31 ENCOUNTER — Ambulatory Visit
Admission: RE | Admit: 2012-08-31 | Discharge: 2012-08-31 | Disposition: A | Discharge status: Home / Self Care | Payer: BC Managed Care – PPO | Source: Ambulatory Visit | Attending: Obstetrics and Gynecology | Admitting: Obstetrics and Gynecology

## 2012-08-31 DIAGNOSIS — R921 Mammographic calcification found on diagnostic imaging of breast: Secondary | ICD-10-CM

## 2013-02-05 ENCOUNTER — Ambulatory Visit: Payer: BC Managed Care – PPO

## 2013-02-22 ENCOUNTER — Ambulatory Visit
Admission: RE | Admit: 2013-02-22 | Discharge: 2013-02-22 | Disposition: A | Discharge status: Home / Self Care | Payer: BC Managed Care – PPO | Source: Ambulatory Visit | Attending: Family Medicine | Admitting: Family Medicine

## 2013-02-22 DIAGNOSIS — Z1231 Encounter for screening mammogram for malignant neoplasm of breast: Secondary | ICD-10-CM

## 2014-01-03 ENCOUNTER — Other Ambulatory Visit: Payer: Self-pay

## 2014-01-03 DIAGNOSIS — Z1231 Encounter for screening mammogram for malignant neoplasm of breast: Secondary | ICD-10-CM

## 2014-02-23 ENCOUNTER — Ambulatory Visit
Admission: RE | Admit: 2014-02-23 | Discharge: 2014-02-23 | Disposition: A | Discharge status: Home / Self Care | Payer: BC Managed Care – PPO | Source: Ambulatory Visit

## 2014-02-23 DIAGNOSIS — Z1231 Encounter for screening mammogram for malignant neoplasm of breast: Secondary | ICD-10-CM

## 2015-11-05 ENCOUNTER — Inpatient Hospital Stay (HOSPITAL_COMMUNITY)
Admission: EM | Admit: 2015-11-05 | Discharge: 2015-11-07 | DRG: 392 | Disposition: A | Discharge status: Home / Self Care | Payer: BC Managed Care – PPO | Attending: Family Medicine | Admitting: Family Medicine

## 2015-11-05 ENCOUNTER — Emergency Department (HOSPITAL_COMMUNITY): Payer: BC Managed Care – PPO

## 2015-11-05 ENCOUNTER — Encounter (HOSPITAL_COMMUNITY): Payer: Self-pay | Admitting: Emergency Medicine

## 2015-11-05 DIAGNOSIS — A084 Viral intestinal infection, unspecified: Principal | ICD-10-CM | POA: Diagnosis present

## 2015-11-05 DIAGNOSIS — R112 Nausea with vomiting, unspecified: Secondary | ICD-10-CM | POA: Diagnosis not present

## 2015-11-05 DIAGNOSIS — E860 Dehydration: Secondary | ICD-10-CM | POA: Diagnosis present

## 2015-11-05 DIAGNOSIS — R109 Unspecified abdominal pain: Secondary | ICD-10-CM | POA: Diagnosis present

## 2015-11-05 DIAGNOSIS — Z79899 Other long term (current) drug therapy: Secondary | ICD-10-CM | POA: Diagnosis not present

## 2015-11-05 DIAGNOSIS — Z7984 Long term (current) use of oral hypoglycemic drugs: Secondary | ICD-10-CM | POA: Diagnosis not present

## 2015-11-05 DIAGNOSIS — E785 Hyperlipidemia, unspecified: Secondary | ICD-10-CM | POA: Diagnosis present

## 2015-11-05 DIAGNOSIS — E1165 Type 2 diabetes mellitus with hyperglycemia: Secondary | ICD-10-CM | POA: Insufficient documentation

## 2015-11-05 DIAGNOSIS — I10 Essential (primary) hypertension: Secondary | ICD-10-CM | POA: Insufficient documentation

## 2015-11-05 DIAGNOSIS — R824 Acetonuria: Secondary | ICD-10-CM

## 2015-11-05 DIAGNOSIS — E119 Type 2 diabetes mellitus without complications: Secondary | ICD-10-CM | POA: Diagnosis present

## 2015-11-05 DIAGNOSIS — E1169 Type 2 diabetes mellitus with other specified complication: Secondary | ICD-10-CM | POA: Insufficient documentation

## 2015-11-05 DIAGNOSIS — N390 Urinary tract infection, site not specified: Secondary | ICD-10-CM

## 2015-11-05 DIAGNOSIS — R197 Diarrhea, unspecified: Secondary | ICD-10-CM

## 2015-11-05 DIAGNOSIS — R1110 Vomiting, unspecified: Secondary | ICD-10-CM | POA: Diagnosis present

## 2015-11-05 HISTORY — DX: Essential (primary) hypertension: I10

## 2015-11-05 LAB — COMPREHENSIVE METABOLIC PANEL
ALT: 20 U/L (ref 14–54)
AST: 25 U/L (ref 15–41)
Albumin: 3.6 g/dL (ref 3.5–5.0)
Alkaline Phosphatase: 93 U/L (ref 38–126)
Anion gap: 11 (ref 5–15)
BUN: 14 mg/dL (ref 6–20)
CO2: 23 mmol/L (ref 22–32)
Calcium: 8.8 mg/dL — ABNORMAL LOW (ref 8.9–10.3)
Chloride: 102 mmol/L (ref 101–111)
Creatinine, Ser: 0.83 mg/dL (ref 0.44–1.00)
GFR calc Af Amer: >60 mL/min (ref >60)
GFR calc non Af Amer: >60 mL/min (ref >60)
Glucose, Bld: 198 mg/dL — ABNORMAL HIGH (ref 65–99)
Potassium: 4.3 mmol/L (ref 3.5–5.1)
Sodium: 136 mmol/L (ref 135–145)
Total Bilirubin: 1 mg/dL (ref 0.3–1.2)
Total Protein: 7.2 g/dL (ref 6.5–8.1)

## 2015-11-05 LAB — CBC
HCT: 42 % (ref 36.0–46.0)
Hemoglobin: 13.9 g/dL (ref 12.0–15.0)
MCH: 28.1 pg (ref 26.0–34.0)
MCHC: 33.1 g/dL (ref 30.0–36.0)
MCV: 85 fL (ref 78.0–100.0)
Platelets: 235 10*3/uL (ref 150–400)
RBC: 4.94 MIL/uL (ref 3.87–5.11)
RDW: 13.3 % (ref 11.5–15.5)
WBC: 11.7 10*3/uL — ABNORMAL HIGH (ref 4.0–10.5)

## 2015-11-05 LAB — URINE MICROSCOPIC-ADD ON

## 2015-11-05 LAB — URINALYSIS, ROUTINE W REFLEX MICROSCOPIC
Bilirubin Urine: NEGATIVE
Glucose, UA: NEGATIVE mg/dL
Ketones, ur: 40 mg/dL — AB
Nitrite: NEGATIVE
Protein, ur: NEGATIVE mg/dL
Specific Gravity, Urine: 1.024 (ref 1.005–1.030)
pH: 5.5 (ref 5.0–8.0)

## 2015-11-05 LAB — LIPASE, BLOOD: Lipase: 20 U/L (ref 11–51)

## 2015-11-05 MED ORDER — DIATRIZOATE MEGLUMINE & SODIUM 66-10 % PO SOLN
ORAL | Status: AC
  Filled 2015-11-05: qty 30

## 2015-11-05 MED ORDER — IOPAMIDOL (ISOVUE-300) INJECTION 61%
INTRAVENOUS | Status: AC
  Administered 2015-11-05: 100 mL
  Filled 2015-11-05: qty 100

## 2015-11-05 MED ORDER — SODIUM CHLORIDE 0.9 % IV BOLUS (SEPSIS): 1000.0000 mL | Freq: Once | INTRAVENOUS | Status: DC

## 2015-11-05 MED ORDER — FENTANYL CITRATE (PF) 100 MCG/2ML IJ SOLN
50.0000 ug | Freq: Once | INTRAMUSCULAR | Status: AC
  Administered 2015-11-05: 50 ug via INTRAVENOUS
  Filled 2015-11-05: qty 2

## 2015-11-05 MED ORDER — LOPERAMIDE HCL 2 MG PO CAPS
2.0000 mg | ORAL_CAPSULE | Freq: Once | ORAL | Status: DC
  Filled 2015-11-05: qty 1

## 2015-11-05 MED ORDER — CIPROFLOXACIN IN D5W 400 MG/200ML IV SOLN
400.0000 mg | Freq: Once | INTRAVENOUS | Status: DC
  Filled 2015-11-05: qty 200

## 2015-11-05 MED ORDER — ACETAMINOPHEN 325 MG PO TABS
650.0000 mg | ORAL_TABLET | Freq: Once | ORAL | Status: AC | PRN
  Administered 2015-11-05: 650 mg via ORAL
  Filled 2015-11-05: qty 2

## 2015-11-05 MED ORDER — SODIUM CHLORIDE 0.9 % IV BOLUS (SEPSIS)
1000.0000 mL | Freq: Once | INTRAVENOUS | Status: AC
  Administered 2015-11-05: 1000 mL via INTRAVENOUS

## 2015-11-05 MED ORDER — ONDANSETRON HCL 4 MG/2ML IJ SOLN
4.0000 mg | Freq: Once | INTRAMUSCULAR | Status: AC
  Administered 2015-11-05: 4 mg via INTRAVENOUS
  Filled 2015-11-05: qty 2

## 2015-11-05 MED ORDER — ONDANSETRON HCL 4 MG/2ML IJ SOLN
4.0000 mg | Freq: Once | INTRAMUSCULAR | Status: AC | PRN
  Administered 2015-11-05: 4 mg via INTRAVENOUS
  Filled 2015-11-05: qty 2

## 2015-11-05 NOTE — ED Notes (Signed)
Spoke to CT.  Pt next for pick-up.  Informed patient and family.

## 2015-11-05 NOTE — ED Notes (Signed)
Per EMS:  Pt presents to ED due to sudden onset epigastric pain, vomiting, and diarrhea.  Vitals are stable.  Pt is febrile (101.6).  Pt states she has also had diarrhea.  Pt given 4mg  Zofran at 1752 via EMS.

## 2015-11-05 NOTE — ED Notes (Signed)
Pt having active wretching and another bout of diarrhea

## 2015-11-05 NOTE — H&P (Signed)
Family Medicine Teaching [facility] Admission History and Physical Service Pager: [phone]  Patient name: Cassie AvenaRobin Isenberg Medical record number: [id number] Date of birth: 04/17/1956 Age: 60 y.o. Gender: female  Primary Care Provider: Josue HectorNYLAND,LEONARD ROBERT, MD Consultants: None  Code Status: FULL (discussed upon admission)  Chief Complaint: Nausea/Vomiting/Diarrhea   Assessment and Plan: Cassie Hunter is a 60 y.o. female presenting with N/V/D and abdominal pain . PMH is significant for T2DM, HTN, diverticulosis, and HLD.   N/V/D with Abdominal Pain: Likely viral gastroenteritis. CT abdomen negative except for colonic diverticulosis without evidence for acute diverticulitis. Potentially infectious colitis, however CT abdomen did not show any evidence of colitis. Patient does meet SIRS criteria with fevers, mild leukocytosis to 11.7 and borderline tachycardia; however, clinically low suspicion for sepsis. Lactic acid 1.5. Lipase WNL at 20. Ketonuria on UA suspicious dehydration. Patient s/p 2 L NS bolus. Electrolytes stable. Suspect abdominal pain related to retching/cramping from diarrhea as pain improved with relaxation of abdominal wall muscles.  -admit to MedSurg, vital signs per unit  -Compazine 10 mg q6h prn ordered as Zofran has not been controlling N/V -NS at 125 cc/hr  -lactic acid ordered, if elevated will give another bolus  -NPO except for sips with meds, advance diet as tolerated  -could consider stool studies and abx for colitis if not improving as expected   ?UTI: patient without any symptoms of UTI. UA with moderate leukocytes, negative nitrites, few bacteria, 6-20 WBC, and 6-30 squamous epithelial cells. S/p ciprofloxacin in the ED.  -obtain urine culture -will not continue abiotics at present as patient is asymptomatic   T2DM: Patient takes Glipizide 5 mg at home. Glucose 198 at admission.  -hold home Glipizide  -sensitive SSI ordered   HTN: Normotensive at admission.   -continue home Losartan 100 mg daily  HLD:  -continue home Simvastatin 20 mg daily   FEN/GI: NPO except sips, NS at 125 cc/hr  Prophylaxis: Lovenox   Disposition: Admit to FPTS; Attending [place]   History of Present Illness:  Cassie AvenaRobin Lowdermilk is a 60 y.o. female presenting with nausea, vomiting and diarrhea. Symptoms started this afternoon around 12:00 noon. She has associated upper abdominal pain and fever up to 101.4. She decided to call EMS because of how sick she felt and was worried she couldn't wait until her son got home. She was given Zofran, which helped initially with nausea. No hematochezia or hematemesis. Reports no sick contacts, but states that her son was at the ED with her earlier today, and he had diarrhea and vomiting as well. Has been unable to tolerate anything PO. Ate a banana sandwich this morning before getting sick. Has tried drinking water but has thrown that up. Abdominal pain is constant but worsens significantly with diarrhea and vomiting.   Review Of Systems: Per HPI with the following additions:  Positive: Negative: dysuria, frequency, chest pain.   Otherwise the remainder of the systems were negative.  Patient Active Problem List   Diagnosis Date Noted  . Nausea vomiting and diarrhea 11/05/2015  . Diverticulitis   . Difficulty swallowing     Past Medical History: Past Medical History  Diagnosis Date  . Diabetes mellitus   . Diverticulitis   . Difficulty swallowing   . Essential hypertension     Past Surgical History: Past Surgical History  Procedure Laterality Date  . Appendectomy    . Cholecystectomy    . Abdominal hysterectomy      Social History: Social History  Substance Use Topics  .  Smoking status: Never Smoker   . Smokeless tobacco: None  . Alcohol Use: No   Additional social history: Does not use any illicit drugs.   Please also refer to relevant sections of EMR.  Family History: Family History  Problem Relation Age of Onset   . Pancreatic cancer Father   . CVA Mother      Allergies and Medications: Allergies  Allergen Reactions  . Codeine Nausea Only   No current facility-administered medications on file prior to encounter.   Current Outpatient Prescriptions on File Prior to Encounter  Medication Sig Dispense Refill  . ibuprofen (ADVIL,MOTRIN) 200 MG tablet Take 800 mg by mouth daily as needed. For pain    . simvastatin (ZOCOR) 20 MG tablet Take 20 mg by mouth daily at 6 PM.       Objective: BP 122/85 mmHg  Pulse 98  Temp(Src) 99.6 F (37.6 C) (Oral)  Resp 12  SpO2 96% Exam: General: female lying in bed, non-toxic appearing, appears ill and vomiting into green emesis bag  Eyes: EOMI. PERRL.  ENTM: Oropharynx clear. No exudates. Poor dentition. Mildly dry MM.  Neck: Full ROM. Supple. No appreciable LAD.  Cardiovascular: Borderline Tachycardic regular rhythm. No murmurs appreciated. No LE edema.  Respiratory: CTAB. Normal WOB.  Abdomen: +BS, soft, mildly tender diffusely, tenderness improves with flexion of hips, non-distended, no rebound  MSK: Moves extremities spontaneously.  Skin: Warm and dry.  Neuro: No focal deficits. Alert and oriented.  Psych: Appropriate mood and affect.   Labs and Imaging: CBC BMET   Recent Labs Lab 11/05/15 2010  WBC 11.7*  HGB 13.9  HCT 42.0  PLT 235    Recent Labs Lab 11/05/15 2010  NA 136  K 4.3  CL 102  CO2 23  BUN 14  CREATININE 0.83  GLUCOSE 198*  CALCIUM 8.8*     Ct Abdomen Pelvis W Contrast  11/05/2015  CLINICAL DATA:  Initial evaluation for acute onset epigastric pain. History of prior diverticulitis. History of prior cholecystectomy, appendectomy, and hysterectomy. EXAM: CT ABDOMEN AND PELVIS WITH CONTRAST TECHNIQUE: Multidetector CT imaging of the abdomen and pelvis was performed using the standard protocol following bolus administration of intravenous contrast. CONTRAST:  100 cc of Isovue-300. COMPARISON:  Prior CT from 05/13/2011.  FINDINGS: Partially visualized lung bases are clear. 5 mm nodule present within the right lower lobe (series 3, image 12), slightly more prominent and solid in appearance as compared to previous. Liver demonstrates a normal contrast enhanced appearance. Gallbladder surgically absent. Mild prominence of the common bile duct like related to post cholecystectomy changes. Spleen and right adrenal gland are normal. Fatty atrophy of the pancreas noted. Pancreas otherwise unremarkable without acute inflammation. Demonstrate a normal contrast enhanced appearance. Tiny 7 mm hypodense nodule within the left adrenal gland is grossly similar to previous, and of doubtful clinical significance. 12 mm cyst at the upper pole left kidney. Kidneys are otherwise unremarkable without evidence of nephrolithiasis, hydronephrosis, or focal enhancing renal mass. Small hiatal hernia noted. Stomach otherwise unremarkable. No evidence for bowel obstruction. Appendix not visualize, consistent with history of prior appendectomy. Scattered diverticula present within the transverse, descending, and sigmoid colon. No significant inflammatory changes to suggest acute diverticulitis. No abnormal wall thickening mucosal enhancement, or inflammatory fat stranding seen about the bowels. Bladder partially distended without acute abnormality. Uterus is absent. Ovaries not discretely identified. No free air or fluid. No pathologically enlarged intra-abdominal or pelvic lymph nodes. Mild aorto bi-iliac atherosclerotic calcifications. No aneurysm. No acute  osseous abnormality. No worrisome lytic or blastic osseous lesions. Levoscoliosis with scattered multilevel degenerative changes. IMPRESSION: 1. No acute intra-abdominal or pelvic process. 2. Colonic diverticulosis without CT evidence for acute diverticulitis. 3. Status post cholecystectomy, appendectomy, and hysterectomy. 4. 5 mm right lower lobe pulmonary nodule, slightly increased in prominence  relative to 2012. No follow-up needed if patient is low-risk. Non-contrast chest CT can be considered in 12 months if patient is high-risk. This recommendation follows the consensus statement: Guidelines for Management of Incidental Pulmonary Nodules Detected on CT Images:From the Fleischner Society 2017; published online before print (10.1148/radiol.[id number]). Electronically Signed   By: Rise Mu M.D.   On: 11/05/2015 22:12    Arvilla Market, DO 11/06/2015, 12:02 AM PGY-1, [place] [facility] FPTS Intern pager: [phone], text pages welcome  I have seen and examined the patient. I have read and agree with the above note. My changes are noted in blue.  Jacquelin Hawking, MD PGY-3, [facility] 11/06/2015, 5:31 AM

## 2015-11-05 NOTE — ED Notes (Signed)
Bedpan request

## 2015-11-05 NOTE — ED Notes (Signed)
Admitting at bedside 

## 2015-11-05 NOTE — ED Provider Notes (Signed)
CSN: [id number]     Arrival date & time 11/05/15  1856 History   First MD Initiated Contact with Patient 11/05/15 1916     Chief Complaint  Patient presents with  . Abdominal Pain  . Nausea    HPI  Patient presents with concern of abdominal pain, nausea, vomiting, diarrhea. Symptoms began today, about 4 hours prior to my evaluation. Patient states that she was well prior to about that time. Only possible identified precipitant is ingestion of a banana sandwich. Since onset symptoms and persistent with diffuse abdominal pain, worse in the upper abdomen. Patient also has fever, chills. No relief with anything, and the patient is intolerant of oral medication. She acknowledges history of prior diverticulitis, appendectomy, cholecystectomy, hysterectomy.   Past Medical History  Diagnosis Date  . Diabetes mellitus   . Diverticulitis   . Difficulty swallowing    Past Surgical History  Procedure Laterality Date  . Appendectomy    . Cholecystectomy    . Abdominal hysterectomy     History reviewed. No pertinent family history. Social History  Substance Use Topics  . Smoking status: Never Smoker   . Smokeless tobacco: None  . Alcohol Use: No   OB History    No data available     Review of Systems  Constitutional:       Per HPI, otherwise negative  HENT:       Per HPI, otherwise negative  Respiratory:       Per HPI, otherwise negative  Cardiovascular:       Per HPI, otherwise negative  Gastrointestinal: Positive for nausea, vomiting and diarrhea.  Endocrine:       Negative aside from HPI  Genitourinary:       Neg aside from HPI   Musculoskeletal:       Per HPI, otherwise negative  Skin: Negative.   Neurological: Positive for weakness. Negative for syncope.      Allergies  Codeine  Home Medications   Prior to Admission medications   Medication Sig Start Date End Date Taking? Authorizing Provider  glipiZIDE (GLUCOTROL XL) 5 MG 24 hr tablet Take 5 mg by mouth  daily. 11/02/15  Yes Historical Provider, MD  ibuprofen (ADVIL,MOTRIN) 200 MG tablet Take 800 mg by mouth daily as needed. For pain   Yes Historical Provider, MD  losartan (COZAAR) 100 MG tablet Take 100 mg by mouth daily. 07/29/15  Yes Historical Provider, MD  simvastatin (ZOCOR) 20 MG tablet Take 20 mg by mouth daily at 6 PM.    Yes Historical Provider, MD   BP 125/67 mmHg  Pulse 104  Temp(Src) 101.6 F (38.7 C) (Oral)  Resp 20  SpO2 95% Physical Exam  Constitutional: She is oriented to person, place, and time. She appears well-developed and well-nourished. No distress.  HENT:  Head: Normocephalic and atraumatic.  Eyes: Conjunctivae and EOM are normal.  Cardiovascular: Regular rhythm.  Tachycardia present.   Pulmonary/Chest: Effort normal and breath sounds normal. No stridor. No respiratory distress.  Abdominal: She exhibits no distension. There is tenderness.  Diffuse mild tenderness, minimal guarding  Musculoskeletal: She exhibits no edema.  Neurological: She is alert and oriented to person, place, and time. No cranial nerve deficit.  Skin: Skin is warm and dry.  Psychiatric: She has a normal mood and affect.  Nursing note and vitals reviewed.   ED Course  Procedures (including critical care time) Labs Review Labs Reviewed  COMPREHENSIVE METABOLIC PANEL - Abnormal; Notable for the following:  Glucose, Bld 198 (*)    Calcium 8.8 (*)    All other components within normal limits  CBC - Abnormal; Notable for the following:    WBC 11.7 (*)    All other components within normal limits  URINALYSIS, ROUTINE W REFLEX MICROSCOPIC (NOT AT [facility]) - Abnormal; Notable for the following:    APPearance CLOUDY (*)    Hgb urine dipstick SMALL (*)    Ketones, ur 40 (*)    Leukocytes, UA MODERATE (*)    All other components within normal limits  URINE MICROSCOPIC-ADD ON - Abnormal; Notable for the following:    Squamous Epithelial / LPF 6-30 (*)    Bacteria, UA FEW (*)    All other  components within normal limits  LIPASE, BLOOD    Imaging Review Ct Abdomen Pelvis W Contrast  11/05/2015  CLINICAL DATA:  Initial evaluation for acute onset epigastric pain. History of prior diverticulitis. History of prior cholecystectomy, appendectomy, and hysterectomy. EXAM: CT ABDOMEN AND PELVIS WITH CONTRAST TECHNIQUE: Multidetector CT imaging of the abdomen and pelvis was performed using the standard protocol following bolus administration of intravenous contrast. CONTRAST:  100 cc of Isovue-300. COMPARISON:  Prior CT from 05/13/2011. FINDINGS: Partially visualized lung bases are clear. 5 mm nodule present within the right lower lobe (series 3, image 12), slightly more prominent and solid in appearance as compared to previous. Liver demonstrates a normal contrast enhanced appearance. Gallbladder surgically absent. Mild prominence of the common bile duct like related to post cholecystectomy changes. Spleen and right adrenal gland are normal. Fatty atrophy of the pancreas noted. Pancreas otherwise unremarkable without acute inflammation. Demonstrate a normal contrast enhanced appearance. Tiny 7 mm hypodense nodule within the left adrenal gland is grossly similar to previous, and of doubtful clinical significance. 12 mm cyst at the upper pole left kidney. Kidneys are otherwise unremarkable without evidence of nephrolithiasis, hydronephrosis, or focal enhancing renal mass. Small hiatal hernia noted. Stomach otherwise unremarkable. No evidence for bowel obstruction. Appendix not visualize, consistent with history of prior appendectomy. Scattered diverticula present within the transverse, descending, and sigmoid colon. No significant inflammatory changes to suggest acute diverticulitis. No abnormal wall thickening mucosal enhancement, or inflammatory fat stranding seen about the bowels. Bladder partially distended without acute abnormality. Uterus is absent. Ovaries not discretely identified. No free air or  fluid. No pathologically enlarged intra-abdominal or pelvic lymph nodes. Mild aorto bi-iliac atherosclerotic calcifications. No aneurysm. No acute osseous abnormality. No worrisome lytic or blastic osseous lesions. Levoscoliosis with scattered multilevel degenerative changes. IMPRESSION: 1. No acute intra-abdominal or pelvic process. 2. Colonic diverticulosis without CT evidence for acute diverticulitis. 3. Status post cholecystectomy, appendectomy, and hysterectomy. 4. 5 mm right lower lobe pulmonary nodule, slightly increased in prominence relative to 2012. No follow-up needed if patient is low-risk. Non-contrast chest CT can be considered in 12 months if patient is high-risk. This recommendation follows the consensus statement: Guidelines for Management of Incidental Pulmonary Nodules Detected on CT Images:From the Fleischner Society 2017; published online before print (10.1148/radiol.[id number]). Electronically Signed   By: Rise Mu M.D.   On: 11/05/2015 22:12   I have personally reviewed and evaluated these images and lab results as part of my medical decision-making.   EKG Interpretation   Date/Time:  Sunday November 05 2015 19:12:46 EDT Ventricular Rate:  107 PR Interval:  154 QRS Duration: 92 QT Interval:  338 QTC Calculation: 451 R Axis:   -70 Text Interpretation:  Sinus tachycardia Left axis deviation T wave  abnormality  Right bundle branch block Abnormal ekg Confirmed by Gerhard MunchLOCKWOOD,  Pablo Mathurin  MD [phone]([id number]) on 11/05/2015 7:17:38 PM     10:45 PM Patient continues to have persistent nausea, vomiting, diarrhea, remains tachycardic. Labs notable for ketonuria, leukocytosis. Some concern for colitis, patient will receive antibiotics in addition to additional fluids. Given her persistent symptoms, discomfort, tachycardia, she'll be admitted for further relation. MDM  Early female with history of diverticulitis presents with new nausea, vomiting, diffuse abdominal pain. Here, the  patient is awake and alert, but does have substantial tenderness throughout her abdomen. Patient's evaluation suggests dehydration, with ketonuria, possible urinary tract infection, and physical exam findings concerning for gastric enteritis versus colitis. Patient remains tachycardic in spite of fluid resuscitation, and remains symptomatic, with nausea, vomiting, diarrhea, by mouth intolerance. Patient admitted after initiation of IV fluids, antibiotics, antiemetics.  Gerhard Munchobert Nyra Anspaugh, MD 11/05/15 [phone]

## 2015-11-05 NOTE — ED Notes (Signed)
Pt sts she had another episode of diarrhea.  States it caused increased epigastric pain momentarily.  Son also had an episode of emesis in room.  Pt c/o nausea at this time.

## 2015-11-06 DIAGNOSIS — E1165 Type 2 diabetes mellitus with hyperglycemia: Secondary | ICD-10-CM | POA: Insufficient documentation

## 2015-11-06 DIAGNOSIS — E785 Hyperlipidemia, unspecified: Secondary | ICD-10-CM | POA: Insufficient documentation

## 2015-11-06 DIAGNOSIS — R197 Diarrhea, unspecified: Secondary | ICD-10-CM

## 2015-11-06 DIAGNOSIS — E119 Type 2 diabetes mellitus without complications: Secondary | ICD-10-CM

## 2015-11-06 DIAGNOSIS — I10 Essential (primary) hypertension: Secondary | ICD-10-CM | POA: Insufficient documentation

## 2015-11-06 DIAGNOSIS — E1169 Type 2 diabetes mellitus with other specified complication: Secondary | ICD-10-CM | POA: Insufficient documentation

## 2015-11-06 DIAGNOSIS — R112 Nausea with vomiting, unspecified: Secondary | ICD-10-CM

## 2015-11-06 DIAGNOSIS — R824 Acetonuria: Secondary | ICD-10-CM | POA: Insufficient documentation

## 2015-11-06 DIAGNOSIS — R1110 Vomiting, unspecified: Secondary | ICD-10-CM | POA: Diagnosis present

## 2015-11-06 LAB — BASIC METABOLIC PANEL
Anion gap: 7 (ref 5–15)
BUN: 14 mg/dL (ref 6–20)
CO2: 24 mmol/L (ref 22–32)
Calcium: 7.8 mg/dL — ABNORMAL LOW (ref 8.9–10.3)
Chloride: 108 mmol/L (ref 101–111)
Creatinine, Ser: 0.89 mg/dL (ref 0.44–1.00)
GFR calc Af Amer: >60 mL/min (ref >60)
GFR calc non Af Amer: >60 mL/min (ref >60)
Glucose, Bld: 229 mg/dL — ABNORMAL HIGH (ref 65–99)
Potassium: 4.2 mmol/L (ref 3.5–5.1)
Sodium: 139 mmol/L (ref 135–145)

## 2015-11-06 LAB — CBC
HCT: 37.6 % (ref 36.0–46.0)
Hemoglobin: 12.3 g/dL (ref 12.0–15.0)
MCH: 28 pg (ref 26.0–34.0)
MCHC: 32.7 g/dL (ref 30.0–36.0)
MCV: 85.5 fL (ref 78.0–100.0)
Platelets: 194 10*3/uL (ref 150–400)
RBC: 4.4 MIL/uL (ref 3.87–5.11)
RDW: 13.4 % (ref 11.5–15.5)
WBC: 8.5 10*3/uL (ref 4.0–10.5)

## 2015-11-06 LAB — C DIFFICILE QUICK SCREEN W PCR REFLEX
C Diff antigen: NEGATIVE
C Diff interpretation: NEGATIVE
C Diff toxin: NEGATIVE

## 2015-11-06 LAB — TROPONIN I: Troponin I: <0.03 ng/mL (ref <0.031)

## 2015-11-06 LAB — GLUCOSE, CAPILLARY
Glucose-Capillary: 117 mg/dL — ABNORMAL HIGH (ref 65–99)
Glucose-Capillary: 122 mg/dL — ABNORMAL HIGH (ref 65–99)
Glucose-Capillary: 137 mg/dL — ABNORMAL HIGH (ref 65–99)
Glucose-Capillary: 155 mg/dL — ABNORMAL HIGH (ref 65–99)
Glucose-Capillary: 211 mg/dL — ABNORMAL HIGH (ref 65–99)

## 2015-11-06 LAB — LACTIC ACID, PLASMA: Lactic Acid, Venous: 1.5 mmol/L (ref 0.5–2.0)

## 2015-11-06 MED ORDER — SIMVASTATIN 20 MG PO TABS
20.0000 mg | ORAL_TABLET | Freq: Every day | ORAL | Status: DC
  Administered 2015-11-06: 20 mg via ORAL
  Filled 2015-11-06: qty 1

## 2015-11-06 MED ORDER — SODIUM CHLORIDE 0.9 % IV SOLN
INTRAVENOUS | Status: AC
  Administered 2015-11-06: 1 mL via INTRAVENOUS

## 2015-11-06 MED ORDER — ACETAMINOPHEN 650 MG RE SUPP: 650.0000 mg | Freq: Four times a day (QID) | RECTAL | Status: DC | PRN

## 2015-11-06 MED ORDER — INSULIN ASPART 100 UNIT/ML ~~LOC~~ SOLN
0.0000 [IU] | Freq: Three times a day (TID) | SUBCUTANEOUS | Status: DC
  Administered 2015-11-06: 1 [IU] via SUBCUTANEOUS
  Administered 2015-11-06: 2 [IU] via SUBCUTANEOUS
  Administered 2015-11-07: 1 [IU] via SUBCUTANEOUS

## 2015-11-06 MED ORDER — INSULIN ASPART 100 UNIT/ML ~~LOC~~ SOLN
0.0000 [IU] | Freq: Every day | SUBCUTANEOUS | Status: DC
  Administered 2015-11-06: 1 [IU] via SUBCUTANEOUS
  Administered 2015-11-06: 2 [IU] via SUBCUTANEOUS

## 2015-11-06 MED ORDER — SODIUM CHLORIDE 0.9 % IV SOLN
INTRAVENOUS | Status: DC
  Administered 2015-11-06: 19:00:00 via INTRAVENOUS
  Administered 2015-11-06: 1 mL via INTRAVENOUS
  Administered 2015-11-06 – 2015-11-07 (×2): via INTRAVENOUS

## 2015-11-06 MED ORDER — PROCHLORPERAZINE MALEATE 10 MG PO TABS: 10.0000 mg | ORAL_TABLET | Freq: Four times a day (QID) | ORAL | Status: DC | PRN

## 2015-11-06 MED ORDER — ONDANSETRON HCL 4 MG/2ML IJ SOLN: 4.0000 mg | Freq: Three times a day (TID) | INTRAMUSCULAR | Status: AC | PRN

## 2015-11-06 MED ORDER — PROCHLORPERAZINE EDISYLATE 5 MG/ML IJ SOLN
10.0000 mg | Freq: Four times a day (QID) | INTRAMUSCULAR | Status: DC | PRN
  Administered 2015-11-06: 10 mg via INTRAVENOUS
  Filled 2015-11-06 (×2): qty 2

## 2015-11-06 MED ORDER — ENOXAPARIN SODIUM 40 MG/0.4ML ~~LOC~~ SOLN
40.0000 mg | Freq: Every day | SUBCUTANEOUS | Status: DC
  Administered 2015-11-06 (×2): 40 mg via SUBCUTANEOUS
  Filled 2015-11-06 (×2): qty 0.4

## 2015-11-06 MED ORDER — ACETAMINOPHEN 325 MG PO TABS
650.0000 mg | ORAL_TABLET | Freq: Four times a day (QID) | ORAL | Status: DC | PRN
  Administered 2015-11-07: 650 mg via ORAL
  Filled 2015-11-06: qty 2

## 2015-11-06 MED ORDER — LOSARTAN POTASSIUM 50 MG PO TABS
100.0000 mg | ORAL_TABLET | Freq: Every day | ORAL | Status: DC
  Administered 2015-11-06 – 2015-11-07 (×2): 100 mg via ORAL
  Filled 2015-11-06 (×2): qty 2

## 2015-11-06 NOTE — Progress Notes (Signed)
Pt. Has had 5 episodes of diarrhea since 1400. MD paged.

## 2015-11-06 NOTE — Discharge Summary (Signed)
[facility] Discharge Summary  Patient name: Cassie AvenaRobin Hunter Medical record number: [id number] Date of birth: Sep 14, 1955 Age: 60 y.o. Gender: female Date of Admission: 11/05/2015  Date of Discharge: 11/07/2015 Admitting Physician: Uvaldo RisingKyle J Fletke, MD  Primary Care Provider: Josue HectorNYLAND,LEONARD ROBERT, MD Consultants: None   Indication for Hospitalization: Nausea, Vomiting, Diarrhea, Abdominal Pain, Dehydration all likely 2/2 to Viral Gastroenteritis   Discharge Diagnoses/Problem List:  Patient Active Problem List   Diagnosis Date Noted  . Vomiting 11/06/2015  . Ketonuria   . Essential hypertension, benign   . Hyperlipemia   . Type 2 diabetes mellitus without complication, without long-term current use of insulin (HCC)   . Nausea vomiting and diarrhea 11/05/2015  . Diverticulitis   . Difficulty swallowing     Disposition: Home   Discharge Condition: Improved   Discharge Exam:  General: female lying in bed, non-toxic appearing, NAD  Cardiovascular: RRR. No murmur appreciated.  Respiratory: CTAB. Normal WOB.  Abdomen: +BS, soft, mildly tender diffusely to palpation, non-distended  Extremities: No LE edema.   Brief Hospital Course:  Cassie Hunter is 60 y.o. female with PMH of T2DM, HTN, diverticulosis, and HLD who presented with acute onset of nausea/vomiting/diarrhea and diffuse abdominal pain. Patient is s/p appendectomy, cholecystectomy, and hysterectomy. Patient reported son and husband had similar symptoms. CT abdomen was negative except for diverticulosis without evidence of diverticulitis. Suspected symptoms related to viral gastroenteritis.   Patient was admitted for conservative management with IVF and antiemetics. At time of admission, patient was febrile, tachycardic, and with leukocytosis to 11.7. Vital signs normalized and patient was without leukocytosis on repeat CBC. She began to tolerate PO better. However, was still having multiple watery  stools. C diff was obtained and was negative. Diarrhea improved and patient had no emesis for over 24 hours prior to discharge. IVF were discontinued and patient tolerated PO. She was stable for discharge home with return precautions.   Issues for Follow Up:  1. Patient with elevated glucose (120s-220s) during hospitalization. Would recommend repeat A1C and consideration of additional medical therapy based on results.  2. Make sure N/V/D and abdominal pain have resolved.  3. Patient had dirty UA with moderate leukocytes, negative nitrites, few bacteria, 6-20 WBC, and 6-30 squamous epithelial cells. She was asymptomatic. Elected to not continue antibiotics started by ED given low suspicion for UTI. Urine culture pending at time of discharge.    Significant Procedures: None   Significant Labs and Imaging:   Recent Labs Lab 11/05/15 2010 11/06/15 0107  WBC 11.7* 8.5  HGB 13.9 12.3  HCT 42.0 37.6  PLT 235 194    Recent Labs Lab 11/05/15 2010 11/06/15 0107  NA 136 139  K 4.3 4.2  CL 102 108  CO2 23 24  GLUCOSE 198* 229*  BUN 14 14  CREATININE 0.83 0.89  CALCIUM 8.8* 7.8*  ALKPHOS 93  --   AST 25  --   ALT 20  --   ALBUMIN 3.6  --     Results/Tests Pending at Time of Discharge: Urine Culture   Discharge Medications:    Medication List    TAKE these medications        glipiZIDE 5 MG 24 hr tablet  Commonly known as:  GLUCOTROL XL  Take 5 mg by mouth daily.     ibuprofen 200 MG tablet  Commonly known as:  ADVIL,MOTRIN  Take 800 mg by mouth daily as needed. For pain     losartan 100 MG tablet  Commonly known as:  COZAAR  Take 100 mg by mouth daily.     prochlorperazine 10 MG tablet  Commonly known as:  COMPAZINE  Take 1 tablet (10 mg total) by mouth every 6 (six) hours as needed for nausea or vomiting.     simvastatin 20 MG tablet  Commonly known as:  ZOCOR  Take 20 mg by mouth daily at 6 PM.        Discharge Instructions: Please refer to Patient  Instructions section of EMR for full details.  Patient was counseled important signs and symptoms that should prompt return to medical care, changes in medications, dietary instructions, activity restrictions, and follow up appointments.   Follow-Up Appointments: Follow-up Information    Schedule an appointment as soon as possible for a visit with Josue Hector, MD.   Specialty:  Family Medicine   Why:  For Hospital Followup   Contact information:   [address] [place] [address] [phone]       Arvilla Market, DO 11/07/2015, 9:46 AM PGY-1, [facility]

## 2015-11-06 NOTE — Progress Notes (Signed)
Family Medicine Teaching Service Daily Progress Note Intern Pager: [phone]  Patient name: Cassie Hunter Medical record number: [id number] Date of birth: 1955-07-31 Age: 60 y.o. Gender: female  Primary Care Provider: Josue Hector, MD Consultants: None  Code Status: FULL   Pt Overview and Major Events to Date:  4/23: Admitted for N/V/D/abdominal pain   Assessment and Plan: Cassie Hunter is a 60 y.o. female presenting with N/V/D and abdominal pain . PMH is significant for T2DM, HTN, diverticulosis, and HLD.   N/V/D with Abdominal Pain, Improving: Likely viral gastroenteritis especially with sick contacts. CT abdomen negative except for colonic diverticulosis without evidence for acute diverticulitis. No red flag symptoms such as hematochezia present. Fever, tachycardia and leukocytosis present at admission have since resolved. Lactic acid 1.5. Suspect abdominal pain related to retching/cramping from diarrhea as pain improved with relaxation of abdominal wall muscles.  -Compazine 10 mg q6h prn ordered as Zofran has not been controlling N/V -NS at 125 cc/hr  -NPO except for sips with meds, advance diet as tolerated, encouraging PO intake  -could consider stool studies and abx for colitis if not improving as expected   ?UTI: patient without any symptoms of UTI. UA with moderate leukocytes, negative nitrites, few bacteria, 6-20 WBC, and 6-30 squamous epithelial cells. S/p ciprofloxacin in the ED.  -obtain urine culture -will not continue abiotics at present as patient is asymptomatic   T2DM: Patient takes Glipizide 5 mg at home. Glucose 198 at admission.  -hold home Glipizide  -sensitive SSI ordered   HTN: Normotensive at admission.  -continue home Losartan 100 mg daily  HLD:  -continue home Simvastatin 20 mg daily   FEN/GI: NPO except sips, NS at 125 cc/hr  Prophylaxis: Lovenox    Disposition: Pending Clinical Improvement   Subjective:  No further episodes of  emesis. Still with nausea and has felt scared to take sips of water for fear of throwing up. Wants to try ice chips this AM. Reports her husband and son are now are home with similar symptoms of nausea, vomiting and diarrhea. Abdominal pain has improved slightly.   Objective: Temp:  [98.7 F (37.1 C)-101.6 F (38.7 C)] 99.4 F (37.4 C) (04/24 0425) Pulse Rate:  [91-106] 93 (04/24 0425) Resp:  [12-20] 17 (04/24 0425) BP: (122-141)/(59-85) 123/59 mmHg (04/24 0425) SpO2:  [94 %-99 %] 97 % (04/24 0425) Weight:  [181 lb 3.5 oz (82.2 kg)] 181 lb 3.5 oz (82.2 kg) (04/24 0010) Physical Exam: General: female lying in bed, non-toxic appearing, NAD  Cardiovascular: RRR. No murmur appreciated.  Respiratory: CTAB. Normal WOB.  Abdomen: +BS, soft, mildly tender diffusely to palpation, non-distended  Extremities: No LE edema.   Laboratory:  Recent Labs Lab 11/05/15 2010 11/06/15 0107  WBC 11.7* 8.5  HGB 13.9 12.3  HCT 42.0 37.6  PLT 235 194    Recent Labs Lab 11/05/15 2010 11/06/15 0107  NA 136 139  K 4.3 4.2  CL 102 108  CO2 23 24  BUN 14 14  CREATININE 0.83 0.89  CALCIUM 8.8* 7.8*  PROT 7.2  --   BILITOT 1.0  --   ALKPHOS 93  --   ALT 20  --   AST 25  --   GLUCOSE 198* 229*    Lipase: 20  UA with ketonuria   Imaging/Diagnostic Tests: Ct Abdomen Pelvis W Contrast  11/05/2015  CLINICAL DATA:  Initial evaluation for acute onset epigastric pain. History of prior diverticulitis. History of prior cholecystectomy, appendectomy, and hysterectomy. EXAM: CT ABDOMEN AND  PELVIS WITH CONTRAST TECHNIQUE: Multidetector CT imaging of the abdomen and pelvis was performed using the standard protocol following bolus administration of intravenous contrast. CONTRAST:  100 cc of Isovue-300. COMPARISON:  Prior CT from 05/13/2011. FINDINGS: Partially visualized lung bases are clear. 5 mm nodule present within the right lower lobe (series 3, image 12), slightly more prominent and solid in appearance  as compared to previous. Liver demonstrates a normal contrast enhanced appearance. Gallbladder surgically absent. Mild prominence of the common bile duct like related to post cholecystectomy changes. Spleen and right adrenal gland are normal. Fatty atrophy of the pancreas noted. Pancreas otherwise unremarkable without acute inflammation. Demonstrate a normal contrast enhanced appearance. Tiny 7 mm hypodense nodule within the left adrenal gland is grossly similar to previous, and of doubtful clinical significance. 12 mm cyst at the upper pole left kidney. Kidneys are otherwise unremarkable without evidence of nephrolithiasis, hydronephrosis, or focal enhancing renal mass. Small hiatal hernia noted. Stomach otherwise unremarkable. No evidence for bowel obstruction. Appendix not visualize, consistent with history of prior appendectomy. Scattered diverticula present within the transverse, descending, and sigmoid colon. No significant inflammatory changes to suggest acute diverticulitis. No abnormal wall thickening mucosal enhancement, or inflammatory fat stranding seen about the bowels. Bladder partially distended without acute abnormality. Uterus is absent. Ovaries not discretely identified. No free air or fluid. No pathologically enlarged intra-abdominal or pelvic lymph nodes. Mild aorto bi-iliac atherosclerotic calcifications. No aneurysm. No acute osseous abnormality. No worrisome lytic or blastic osseous lesions. Levoscoliosis with scattered multilevel degenerative changes. IMPRESSION: 1. No acute intra-abdominal or pelvic process. 2. Colonic diverticulosis without CT evidence for acute diverticulitis. 3. Status post cholecystectomy, appendectomy, and hysterectomy. 4. 5 mm right lower lobe pulmonary nodule, slightly increased in prominence relative to 2012. No follow-up needed if patient is low-risk. Non-contrast chest CT can be considered in 12 months if patient is high-risk. This recommendation follows the  consensus statement: Guidelines for Management of Incidental Pulmonary Nodules Detected on CT Images:From the Fleischner Society 2017; published online before print (10.1148/radiol.[id number]). Electronically Signed   By: Rise MuBenjamin  McClintock M.D.   On: 11/05/2015 22:12    Arvilla Marketatherine Lauren Brunella Wileman, DO 11/06/2015, 7:05 AM PGY-1, [place] Intern pager: [phone], text pages welcome

## 2015-11-06 NOTE — Care Management Note (Signed)
Case Management Note  Patient Details  Name: Ernie AvenaRobin Pasquini MRN: [id number] Date of Birth: 28-Nov-1955  Subjective/Objective:                    Action/Plan:   Expected Discharge Date:  11/07/15               Expected Discharge Plan:  Home/Self Care  In-House Referral:     Discharge planning Services     Post Acute Care Choice:    Choice offered to:     DME Arranged:    DME Agency:     HH Arranged:    HH Agency:     Status of Service:  In process, will continue to follow  Medicare Important Message Given:    Date Medicare IM Given:    Medicare IM give by:    Date Additional Medicare IM Given:    Additional Medicare Important Message give by:     If discussed at Long Length of Stay Meetings, dates discussed:    Additional Comments:  Kingsley PlanWile, Reginold Beale Marie, RN 11/06/2015, 10:24 AM

## 2015-11-07 LAB — URINE CULTURE: Culture: >=100000 — AB

## 2015-11-07 LAB — GLUCOSE, CAPILLARY
Glucose-Capillary: 123 mg/dL — ABNORMAL HIGH (ref 65–99)
Glucose-Capillary: 115 mg/dL — ABNORMAL HIGH (ref 65–99)

## 2015-11-07 NOTE — Progress Notes (Signed)
Family Medicine Teaching Service Daily Progress Note Intern Pager: [phone]  Patient name: Cassie Hunter Medical record number: [id number] Date of birth: 1956-06-26 Age: 60 y.o. Gender: female  Primary Care Provider: Josue HectorNYLAND,LEONARD ROBERT, MD Consultants: None  Code Status: FULL   Pt Overview and Major Events to Date:  4/23: Admitted for N/V/D/abdominal pain  4/24: C diff negative   Assessment and Plan: Cassie Hunter is a 60 y.o. female presenting with N/V/D and abdominal pain . PMH is significant for T2DM, HTN, diverticulosis, and HLD.   N/V/D with Abdominal Pain, Improving: Likely viral gastroenteritis especially with sick contacts. CT abdomen negative except for colonic diverticulosis without evidence for acute diverticulitis. With multiple episodes of watery diarrhea on 4/24. C diff negative. Continues to remain afebrile with stable vital signs.  -Compazine 10 mg q6h prn  -d/c IVF as patient has been tolerating PO better   ?UTI: patient without any symptoms of UTI. UA with moderate leukocytes, negative nitrites, few bacteria, 6-20 WBC, and 6-30 squamous epithelial cells. S/p ciprofloxacin in the ED.  -urine culture pending  -will not continue abiotics at present as patient is asymptomatic   T2DM: Patient takes Glipizide 5 mg at home.  -hold home Glipizide  -sensitive SSI ordered   HTN: Normotensive at admission.  -continue home Losartan 100 mg daily  HLD:  -continue home Simvastatin 20 mg daily   FEN/GI: Heart Healthy Diet, SLIV  Prophylaxis: Lovenox    Disposition: Home today   Subjective:  States she is doing much better this morning. No episodes of emesis since admission. Has been tolerating PO. Had episode of diarrhea this morning but it was more formed and not as watery. Abdominal pain has resolved and is now just "sore".   Objective: Temp:  [98.1 F (36.7 C)-99 F (37.2 C)] 98.1 F (36.7 C) (04/25 0611) Pulse Rate:  [73-89] 73 (04/25 0611) Resp:  [18] 18  (04/25 0611) BP: (130-131)/(71-75) 131/75 mmHg (04/25 0611) SpO2:  [95 %-98 %] 95 % (04/25 14780611) Physical Exam: General: female lying in bed, non-toxic appearing, NAD  Cardiovascular: RRR. No murmur appreciated.  Respiratory: CTAB. Normal WOB.  Abdomen: +BS, soft, mildly tender diffusely to palpation, non-distended  Extremities: No LE edema.   Laboratory:  Recent Labs Lab 11/05/15 2010 11/06/15 0107  WBC 11.7* 8.5  HGB 13.9 12.3  HCT 42.0 37.6  PLT 235 194    Recent Labs Lab 11/05/15 2010 11/06/15 0107  NA 136 139  K 4.3 4.2  CL 102 108  CO2 23 24  BUN 14 14  CREATININE 0.83 0.89  CALCIUM 8.8* 7.8*  PROT 7.2  --   BILITOT 1.0  --   ALKPHOS 93  --   ALT 20  --   AST 25  --   GLUCOSE 198* 229*    Lipase: 20  UA with ketonuria   Imaging/Diagnostic Tests: Ct Abdomen Pelvis W Contrast  11/05/2015  CLINICAL DATA:  Initial evaluation for acute onset epigastric pain. History of prior diverticulitis. History of prior cholecystectomy, appendectomy, and hysterectomy. EXAM: CT ABDOMEN AND PELVIS WITH CONTRAST TECHNIQUE: Multidetector CT imaging of the abdomen and pelvis was performed using the standard protocol following bolus administration of intravenous contrast. CONTRAST:  100 cc of Isovue-300. COMPARISON:  Prior CT from 05/13/2011. FINDINGS: Partially visualized lung bases are clear. 5 mm nodule present within the right lower lobe (series 3, image 12), slightly more prominent and solid in appearance as compared to previous. Liver demonstrates a normal contrast enhanced appearance. Gallbladder surgically  absent. Mild prominence of the common bile duct like related to post cholecystectomy changes. Spleen and right adrenal gland are normal. Fatty atrophy of the pancreas noted. Pancreas otherwise unremarkable without acute inflammation. Demonstrate a normal contrast enhanced appearance. Tiny 7 mm hypodense nodule within the left adrenal gland is grossly similar to previous, and of  doubtful clinical significance. 12 mm cyst at the upper pole left kidney. Kidneys are otherwise unremarkable without evidence of nephrolithiasis, hydronephrosis, or focal enhancing renal mass. Small hiatal hernia noted. Stomach otherwise unremarkable. No evidence for bowel obstruction. Appendix not visualize, consistent with history of prior appendectomy. Scattered diverticula present within the transverse, descending, and sigmoid colon. No significant inflammatory changes to suggest acute diverticulitis. No abnormal wall thickening mucosal enhancement, or inflammatory fat stranding seen about the bowels. Bladder partially distended without acute abnormality. Uterus is absent. Ovaries not discretely identified. No free air or fluid. No pathologically enlarged intra-abdominal or pelvic lymph nodes. Mild aorto bi-iliac atherosclerotic calcifications. No aneurysm. No acute osseous abnormality. No worrisome lytic or blastic osseous lesions. Levoscoliosis with scattered multilevel degenerative changes. IMPRESSION: 1. No acute intra-abdominal or pelvic process. 2. Colonic diverticulosis without CT evidence for acute diverticulitis. 3. Status post cholecystectomy, appendectomy, and hysterectomy. 4. 5 mm right lower lobe pulmonary nodule, slightly increased in prominence relative to 2012. No follow-up needed if patient is low-risk. Non-contrast chest CT can be considered in 12 months if patient is high-risk. This recommendation follows the consensus statement: Guidelines for Management of Incidental Pulmonary Nodules Detected on CT Images:From the Fleischner Society 2017; published online before print (10.1148/[id number]). Electronically Signed   By: Rise Mu M.D.   On: 11/05/2015 22:12    Arvilla Market, DO 11/07/2015, 7:22 AM PGY-1, [facility] FPTS Intern pager: [phone], text pages welcome

## 2015-11-07 NOTE — Discharge Instructions (Signed)
You can take Compazine for nausea at home. Advance your diet slowly and try to eat more bland foods for the next couple of days. Make sure you are drinking plenty of fluids. You can take Ibuprofen for abdominal soreness. Please return if you develop high fevers or start having vomiting again without ability to keep liquids down.

## 2015-11-07 NOTE — Progress Notes (Signed)
Pt scheduled for discharge home this pm. Discharge instructions provided with no concerns voiced. Waiting for her ride home

## 2017-01-02 LAB — HM HEPATITIS C SCREENING LAB: HM Hepatitis Screen: NEGATIVE

## 2017-05-05 IMAGING — CT CT ABD-PELV W/ CM
2 of 5 series · 15 of 46 positions shown, 17 images · IV contrast (Omni 300)
Comparison: Prior CT from 05/13/2011.

CLINICAL DATA: Initial evaluation for acute onset epigastric pain.
History of prior diverticulitis. History of prior cholecystectomy,
appendectomy, and hysterectomy.

EXAM:
CT ABDOMEN AND PELVIS WITH CONTRAST
TECHNIQUE: Multidetector CT imaging of the abdomen and pelvis was performed
using the standard protocol following bolus administration of
intravenous contrast.
CONTRAST:  100 cc of Asovue-KFF.

[Series 2: a/p w/ 5mm · axial · 0.80mm/px · z∈[-431,-36]mm · 12 of 91 slices shown, 14 images]
[im 6/91  soft-tissue]
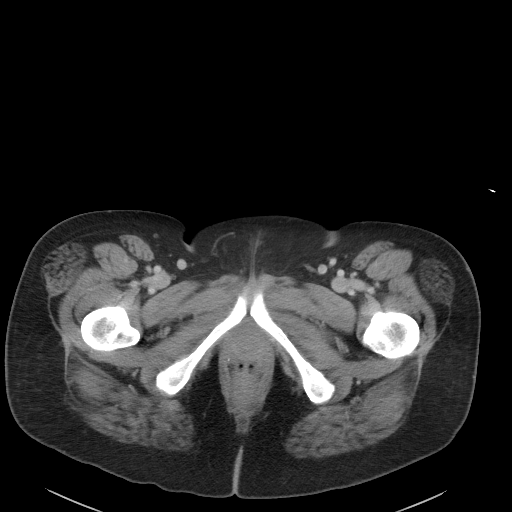
[im 6/91  bone]
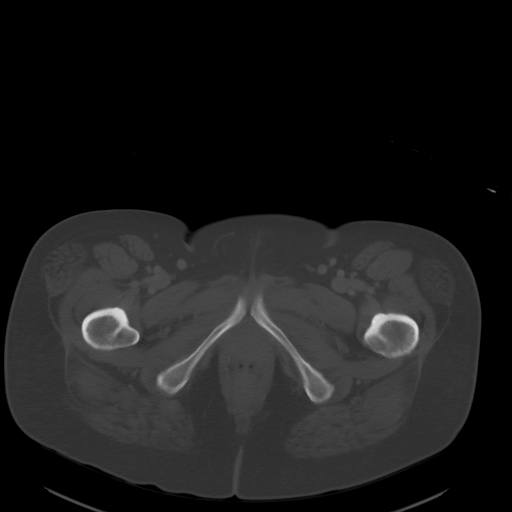
[im 12/91  soft-tissue]
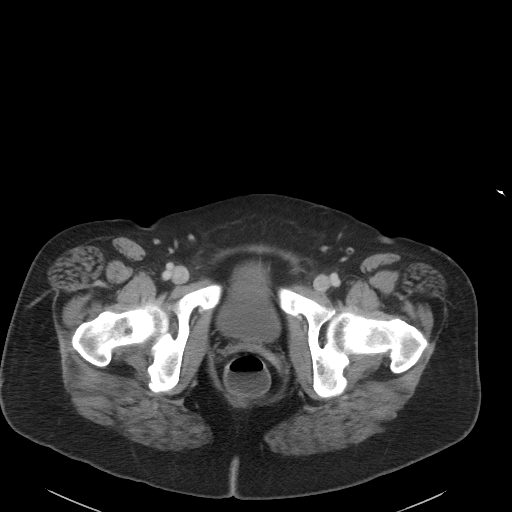
[im 23/91  soft-tissue]
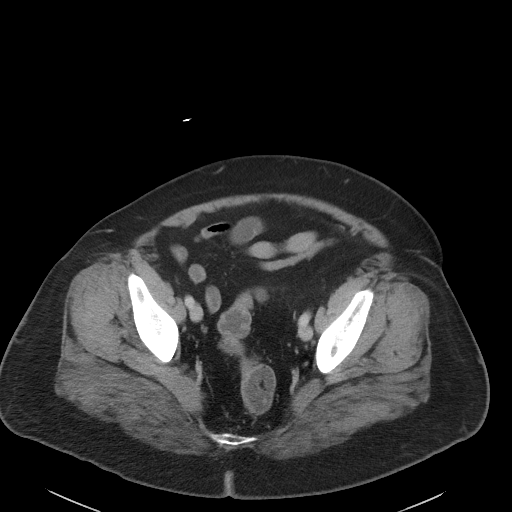
[im 29/91  soft-tissue]
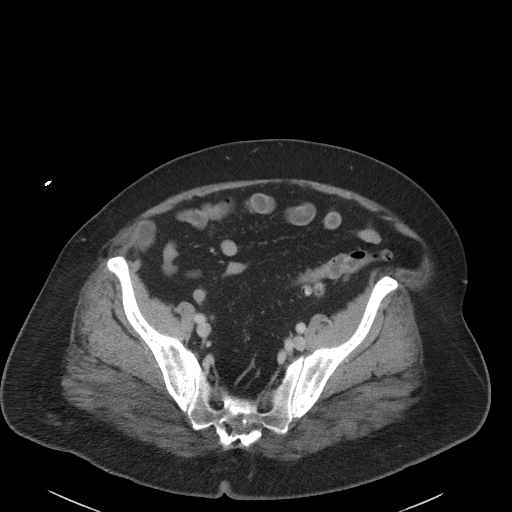
[im 34/91  soft-tissue]
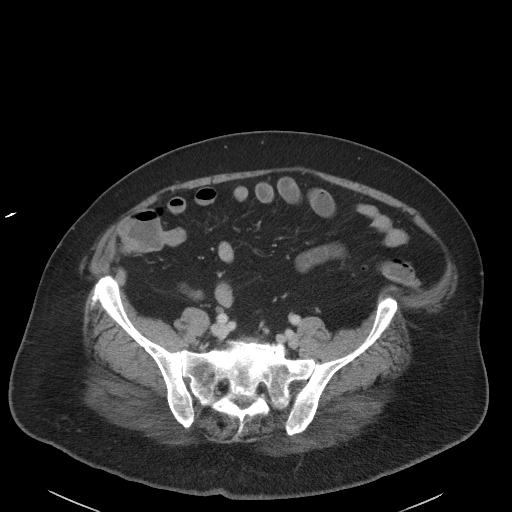
[im 40/91  soft-tissue]
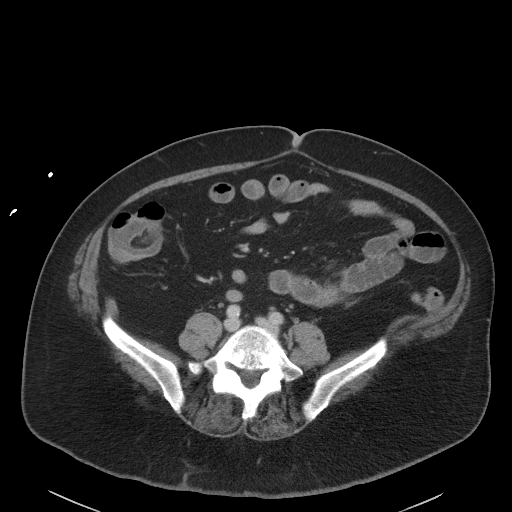
[im 51/91  soft-tissue]
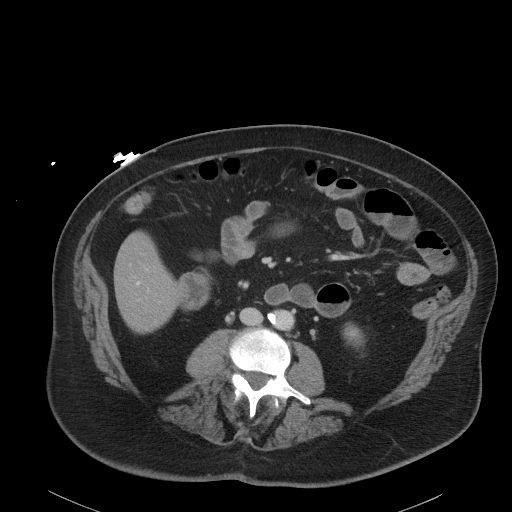
[im 57/91  soft-tissue]
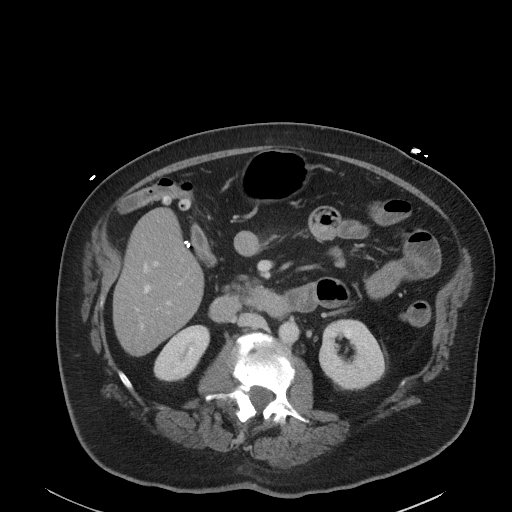
[im 62/91  soft-tissue]
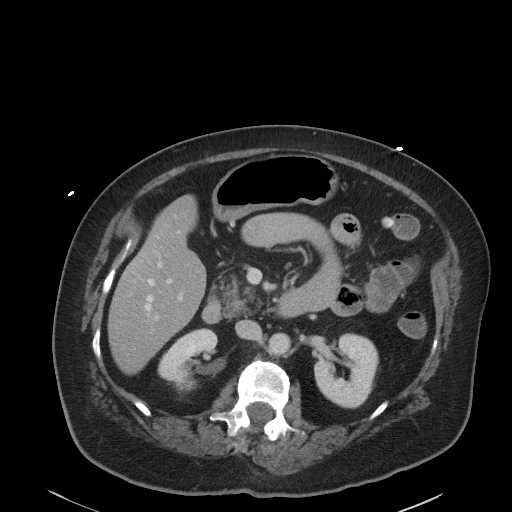
[im 62/91  bone]
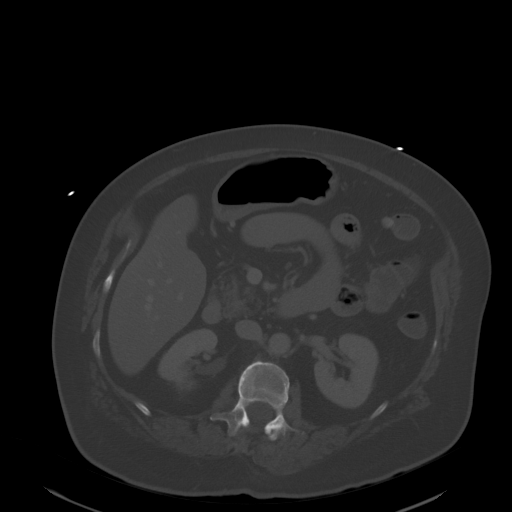
[im 68/91  soft-tissue]
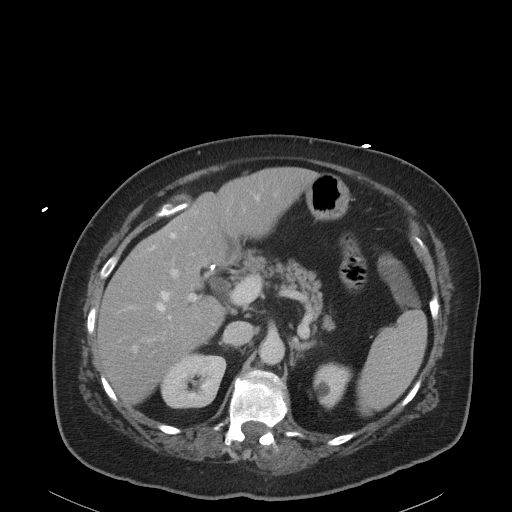
[im 79/91  soft-tissue]
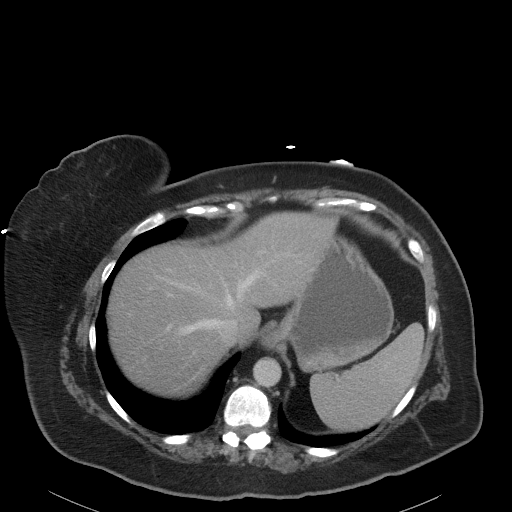
[im 85/91  soft-tissue]
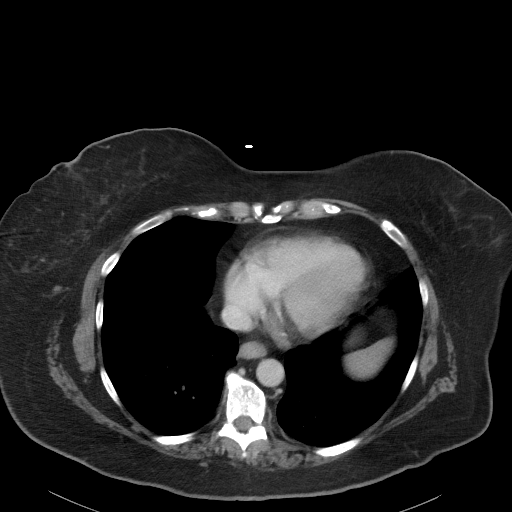

[Series 5: a/p w/ cor · coronal · 0.74mm/px · 3 of 144 slices shown]
[im 48/144  soft-tissue]
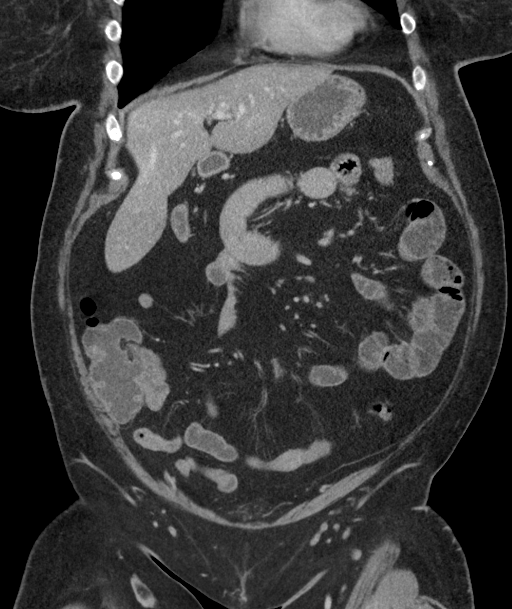
[im 64/144  soft-tissue]
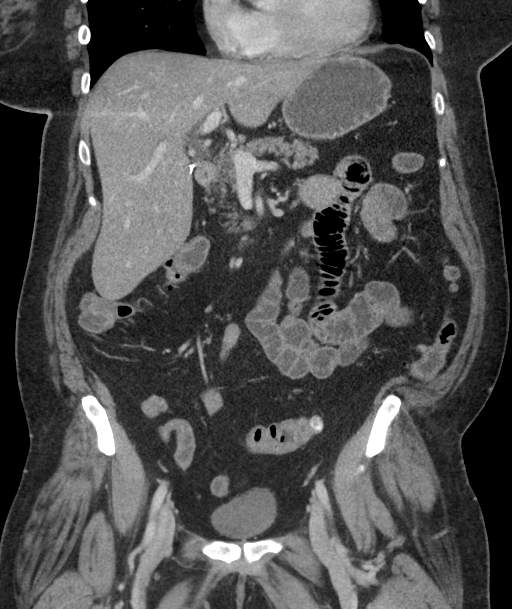
[im 80/144  soft-tissue]
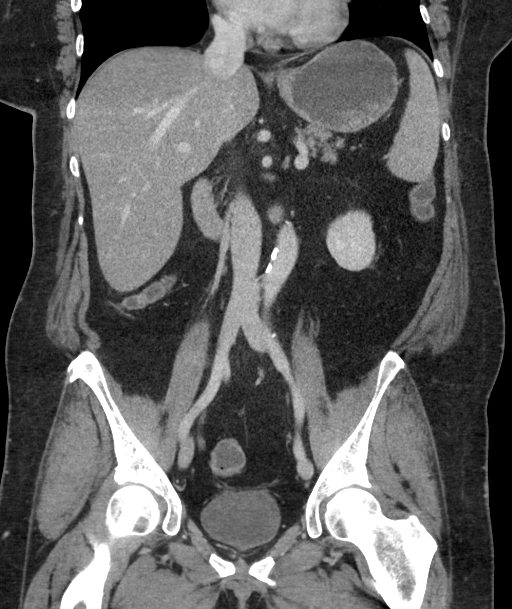

[15 of 46 positions shown; findings below may reference images not displayed]

FINDINGS: Partially visualized lung bases are clear. 5 mm nodule present
within the right lower lobe (series 3, image 12), slightly more
prominent and solid in appearance as compared to previous.

Liver demonstrates a normal contrast enhanced appearance.
Gallbladder surgically absent. Mild prominence of the common bile
duct like related to post cholecystectomy changes. Spleen and right
adrenal gland are normal. Fatty atrophy of the pancreas noted.
Pancreas otherwise unremarkable without acute inflammation.
Demonstrate a normal contrast enhanced appearance. Tiny 7 mm
hypodense nodule within the left adrenal gland is grossly similar to
previous, and of doubtful clinical significance.

12 mm cyst at the upper pole left kidney. Kidneys are otherwise
unremarkable without evidence of nephrolithiasis, hydronephrosis, or
focal enhancing renal mass.

Small hiatal hernia noted. Stomach otherwise unremarkable. No
evidence for bowel obstruction. Appendix not visualize, consistent
with history of prior appendectomy. Scattered diverticula present
within the transverse, descending, and sigmoid colon. No significant
inflammatory changes to suggest acute diverticulitis. No abnormal
wall thickening mucosal enhancement, or inflammatory fat stranding
seen about the bowels.

Bladder partially distended without acute abnormality. Uterus is
absent. Ovaries not discretely identified.

No free air or fluid. No pathologically enlarged intra-abdominal or
pelvic lymph nodes. Mild aorto bi-iliac atherosclerotic
calcifications. No aneurysm.

No acute osseous abnormality. No worrisome lytic or blastic osseous
lesions. Levoscoliosis with scattered multilevel degenerative
changes.
IMPRESSION: 1. No acute intra-abdominal or pelvic process.
2. Colonic diverticulosis without CT evidence for acute
diverticulitis.
3. Status post cholecystectomy, appendectomy, and hysterectomy.
4. 5 mm right lower lobe pulmonary nodule, slightly increased in
prominence relative to 0510. No follow-up needed if patient is
low-risk. Non-contrast chest CT can be considered in 12 months if
patient is high-risk. This recommendation follows the consensus
statement: Guidelines for Management of Incidental Pulmonary Nodules
Detected on CT Images:From the [HOSPITAL] 0343; published
online before print (10.1148/radiol.[id number]).

## 2019-06-01 ENCOUNTER — Other Ambulatory Visit: Payer: Self-pay | Admitting: Family Medicine

## 2019-06-01 DIAGNOSIS — N6320 Unspecified lump in the left breast, unspecified quadrant: Secondary | ICD-10-CM

## 2019-06-20 NOTE — Progress Notes (Signed)
New Patient Office Visit  Assessment & Plan:  1. Type 2 diabetes mellitus without complication, without long-term current use of insulin (HCC) Lab Results  Component Value Date   HGBA1C 8.3 (H) 06/22/2019  - Diabetes is not at goal of A1c < 7. - Medications: patient not currently on medication due to trouble swallowing. I have sent metformin 500 mg to take BID; advised to start with once daily x1 week and then increase to twice daily - Patient is currently taking a statin - refilled today. Patient is taking an ACE-inhibitor/ARB - refilled today.  - Last foot exam: 06/22/2019 - Last diabetic eye exam: unknown - encouraged patient to schedule - Urine Microalbumin/Creat Ratio: 06/22/2019 - Instruction/counseling given: discussed foot care - CMP14+EGFR - HgbA1C - Microalbumin / creatinine urine ratio - simvastatin (ZOCOR) 20 MG tablet; Take 1 tablet (20 mg total) by mouth daily at 6 PM.  Dispense: 90 tablet; Refill: 1  2. Essential hypertension, benign - Uncontrolled without medication. Education provided on the DASH diet. Continue regular exercise and low salt diet.  - losartan (COZAAR) 100 MG tablet; Take 1 tablet (100 mg total) by mouth daily.  Dispense: 90 tablet; Refill: 1  3-4. Dysphagia, unspecified type/History of esophageal dilatation - Referring back to GI for repeat esophageal dilation. Patient is going to crush her medications and take with either apple sauce, yogurt, or pudding. She is tolerating liquids. Patient to cut meats up finely.  - Ambulatory referral to Gastroenterology  5. Colon cancer screening - Ambulatory referral to Gastroenterology  6. Immunization due - SHINGRIX injection; Inject 0.5 mLs into the muscle once for 1 dose.  Dispense: 0.5 mL; Refill: 0   Follow-up: Return in about 4 weeks (around 07/20/2019) for HTN (telephone).   Hendricks Limes, MSN, APRN, FNP-C [facility]  Subjective:  Patient ID: Cassie Hunter, female     DOB: 03-26-1956  Age: 63 y.o. MRN: [id number]  Patient Care Team: Loman Brooklyn, FNP as PCP - General (Family Medicine)  CC:  Chief Complaint  Patient presents with  . New Patient (Initial Visit)    [name] Patient states she has a hard time swolling her meds so she stopped them.  States it has been ongoing  . Establish Care    HPI Cassie Hunter presents to establish care. She is transferring care from Dr. Murrell Redden office as he has retired and the office has closed.    Patient has been having a hard time swallowing. History of esophageal dilation years ago. This was completed with Dr. Amedeo Plenty at [facility]. Has not been taking any medications for the past nine months because of this. She gets choked really bad with meats but does also get choked with other foods such as cereal this morning. She does tolerate liquids. Swallowing has gotten considerably worse in the past month.  Diabetes: Patient presents for follow up of diabetes. Current symptoms include: none. Known diabetic complications: none. Cardiovascular risk factors: diabetes mellitus and hypertension. Current diabetic medications include none. Eye exam current (within one year): no. Weight trend: stable. Prior visit with dietician: no. Current diet: in general, a "healthy" diet  . Current exercise: walking 2-4 miles a day. Current monitoring regimen: none. Is She on ACE inhibitor or angiotensin II receptor blocker? No.   Review of Systems  Constitutional: Negative for chills, fever, malaise/fatigue and weight loss.  HENT: Negative for congestion, ear discharge, ear pain, nosebleeds, sinus pain, sore throat and tinnitus.   Eyes: Negative  for blurred vision, double vision, pain, discharge and redness.  Respiratory: Positive for shortness of breath (with exertion). Negative for cough and wheezing.   Cardiovascular: Negative for chest pain, palpitations and leg swelling.  Gastrointestinal: Negative for abdominal  pain, constipation, diarrhea, heartburn, nausea and vomiting.  Genitourinary: Negative for dysuria, frequency and urgency.  Musculoskeletal: Negative for myalgias.  Skin: Negative for rash.  Neurological: Negative for dizziness, seizures, weakness and headaches.  Psychiatric/Behavioral: Negative for depression, substance abuse and suicidal ideas. The patient is not nervous/anxious.     Current Outpatient Medications:  .  losartan (COZAAR) 100 MG tablet, Take 1 tablet (100 mg total) by mouth daily., Disp: 90 tablet, Rfl: 1 .  SHINGRIX injection, Inject 0.5 mLs into the muscle once for 1 dose., Disp: 0.5 mL, Rfl: 0 .  simvastatin (ZOCOR) 20 MG tablet, Take 1 tablet (20 mg total) by mouth daily at 6 PM., Disp: 90 tablet, Rfl: 1  Allergies  Allergen Reactions  . Codeine Nausea Only    Past Medical History:  Diagnosis Date  . Diabetes mellitus   . Difficulty swallowing   . Diverticulitis   . Essential hypertension     Past Surgical History:  Procedure Laterality Date  . ABDOMINAL HYSTERECTOMY    . APPENDECTOMY    . CHOLECYSTECTOMY    . SPINAL CORD DECOMPRESSION  1996    Family History  Problem Relation Age of Onset  . Pancreatic cancer Father   . CVA Mother   . Spina bifida Sister   . Heart attack Maternal Grandmother   . Heart attack Maternal Grandfather   . Stroke Paternal Grandmother   . Aneurysm Paternal Grandfather     Social History   Socioeconomic History  . Marital status: Married    Spouse name: Not on file  . Number of children: Not on file  . Years of education: Not on file  . Highest education level: Not on file  Occupational History  . Not on file  Social Needs  . Financial resource strain: Not on file  . Food insecurity    Worry: Not on file    Inability: Not on file  . Transportation needs    Medical: Not on file    Non-medical: Not on file  Tobacco Use  . Smoking status: Never Smoker  . Smokeless tobacco: Never Used  Substance and Sexual  Activity  . Alcohol use: No  . Drug use: No  . Sexual activity: Not on file  Lifestyle  . Physical activity    Days per week: Not on file    Minutes per session: Not on file  . Stress: Not on file  Relationships  . Social Herbalist on phone: Not on file    Gets together: Not on file    Attends religious service: Not on file    Active member of club or organization: Not on file    Attends meetings of clubs or organizations: Not on file    Relationship status: Not on file  . Intimate partner violence    Fear of current or ex partner: Not on file    Emotionally abused: Not on file    Physically abused: Not on file    Forced sexual activity: Not on file  Other Topics Concern  . Not on file  Social History Narrative  . Not on file    Objective:   Today's Vitals: BP (!) 169/86   Pulse 92   Temp (!) 97.5 F (  36.4 C) (Temporal)   Ht _0  (1.6 m)   Wt 167 lb 3.2 oz (75.8 kg)   SpO2 93%   BMI 29.62 kg/m   Physical Exam Vitals signs reviewed.  Constitutional:      General: She is not in acute distress.    Appearance: Normal appearance. She is overweight. She is not ill-appearing, toxic-appearing or diaphoretic.  HENT:     Head: Normocephalic and atraumatic.  Eyes:     General: No scleral icterus.       Right eye: No discharge.        Left eye: No discharge.     Conjunctiva/sclera: Conjunctivae normal.  Neck:     Musculoskeletal: Normal range of motion.  Cardiovascular:     Rate and Rhythm: Normal rate and regular rhythm.     Heart sounds: Normal heart sounds. No murmur. No friction rub. No gallop.   Pulmonary:     Effort: Pulmonary effort is normal. No respiratory distress.     Breath sounds: Normal breath sounds. No stridor. No wheezing, rhonchi or rales.  Musculoskeletal: Normal range of motion.  Skin:    General: Skin is warm and dry.     Capillary Refill: Capillary refill takes less than 2 seconds.  Neurological:     General: No focal deficit  present.     Mental Status: She is alert and oriented to person, place, and time. Mental status is at baseline.  Psychiatric:        Mood and Affect: Mood normal.        Behavior: Behavior normal.        Thought Content: Thought content normal.        Judgment: Judgment normal.    Diabetic Foot Exam - Simple   Simple Foot Form Diabetic Foot exam was performed with the following findings: Yes 06/22/2019 10:18 AM  Visual Inspection No deformities, no ulcerations, no other skin breakdown bilaterally: Yes Sensation Testing See comments: Yes Pulse Check Posterior Tibialis and Dorsalis pulse intact bilaterally: Yes Comments Unable to feel any monofilament testing to the entire left foot due to back issues; able to feel all of right foot.

## 2019-06-21 ENCOUNTER — Ambulatory Visit
Admission: RE | Admit: 2019-06-21 | Discharge: 2019-06-21 | Disposition: A | Discharge status: Home / Self Care | Payer: BC Managed Care – PPO | Source: Ambulatory Visit | Attending: Family Medicine | Admitting: Family Medicine

## 2019-06-21 ENCOUNTER — Other Ambulatory Visit: Payer: Self-pay

## 2019-06-21 DIAGNOSIS — N6320 Unspecified lump in the left breast, unspecified quadrant: Secondary | ICD-10-CM

## 2019-06-22 ENCOUNTER — Other Ambulatory Visit: Payer: Self-pay

## 2019-06-22 ENCOUNTER — Ambulatory Visit: Payer: BC Managed Care – PPO | Admitting: Family Medicine

## 2019-06-22 ENCOUNTER — Encounter: Payer: Self-pay | Admitting: Family Medicine

## 2019-06-22 VITALS — BP 169/86 | HR 92 | Temp 97.5°F | Ht 63.0 in | Wt 167.2 lb

## 2019-06-22 DIAGNOSIS — E119 Type 2 diabetes mellitus without complications: Secondary | ICD-10-CM

## 2019-06-22 DIAGNOSIS — R1310 Dysphagia, unspecified: Secondary | ICD-10-CM | POA: Diagnosis not present

## 2019-06-22 DIAGNOSIS — I10 Essential (primary) hypertension: Secondary | ICD-10-CM | POA: Diagnosis not present

## 2019-06-22 DIAGNOSIS — Z23 Encounter for immunization: Secondary | ICD-10-CM | POA: Diagnosis not present

## 2019-06-22 DIAGNOSIS — Z9889 Other specified postprocedural states: Secondary | ICD-10-CM | POA: Diagnosis not present

## 2019-06-22 DIAGNOSIS — Z1211 Encounter for screening for malignant neoplasm of colon: Secondary | ICD-10-CM

## 2019-06-22 LAB — BAYER DCA HB A1C WAIVED: HB A1C (BAYER DCA - WAIVED): 8.3 % — ABNORMAL HIGH (ref <7.0)

## 2019-06-22 MED ORDER — LOSARTAN POTASSIUM 100 MG PO TABS: 100.0000 mg | ORAL_TABLET | Freq: Every day | ORAL | 1 refills | Status: DC

## 2019-06-22 MED ORDER — METFORMIN HCL 500 MG PO TABS: 500.0000 mg | ORAL_TABLET | Freq: Two times a day (BID) | ORAL | 2 refills | Status: DC

## 2019-06-22 MED ORDER — SHINGRIX 50 MCG/0.5ML IM SUSR: 0.5000 mL | Freq: Once | INTRAMUSCULAR | 0 refills | Status: AC

## 2019-06-22 MED ORDER — SIMVASTATIN 20 MG PO TABS: 20.0000 mg | ORAL_TABLET | Freq: Every day | ORAL | 1 refills | Status: DC

## 2019-06-22 NOTE — Patient Instructions (Signed)

## 2019-06-23 LAB — CMP14+EGFR
ALT: 11 IU/L (ref 0–32)
AST: 13 IU/L (ref 0–40)
Albumin/Globulin Ratio: 1.6 (ref 1.2–2.2)
Albumin: 4 g/dL (ref 3.8–4.8)
Alkaline Phosphatase: 130 IU/L — ABNORMAL HIGH (ref 39–117)
BUN/Creatinine Ratio: 12 (ref 12–28)
BUN: 9 mg/dL (ref 8–27)
Bilirubin Total: 0.5 mg/dL (ref 0.0–1.2)
CO2: 24 mmol/L (ref 20–29)
Calcium: 9.7 mg/dL (ref 8.7–10.3)
Chloride: 100 mmol/L (ref 96–106)
Creatinine, Ser: 0.78 mg/dL (ref 0.57–1.00)
GFR calc Af Amer: 93 mL/min/{1.73_m2} (ref >59)
GFR calc non Af Amer: 81 mL/min/{1.73_m2} (ref >59)
Globulin, Total: 2.5 g/dL (ref 1.5–4.5)
Glucose: 238 mg/dL — ABNORMAL HIGH (ref 65–99)
Potassium: 4.9 mmol/L (ref 3.5–5.2)
Sodium: 139 mmol/L (ref 134–144)
Total Protein: 6.5 g/dL (ref 6.0–8.5)

## 2019-06-23 LAB — MICROALBUMIN / CREATININE URINE RATIO
Creatinine, Urine: 164.3 mg/dL
Microalb/Creat Ratio: 12 mg/g creat (ref 0–29)
Microalbumin, Urine: 19.7 ug/mL

## 2019-07-20 NOTE — Progress Notes (Signed)
   Virtual Visit via Telephone Note  I connected with Cassie Hunter on 07/25/19 at 10:05 AM by telephone and verified that I am speaking with the correct person using two identifiers. Cassie Hunter is currently located at home and nobody is currently with her during this visit. The provider, Gwenlyn Fudge, FNP is located in their office at time of visit.  I discussed the limitations, risks, security and privacy concerns of performing an evaluation and management service by telephone and the availability of in person appointments. I also discussed with the patient that there may be a patient responsible charge related to this service. The patient expressed understanding and agreed to proceed.  Subjective: PCP: Gwenlyn Fudge, FNP  Chief Complaint  Patient presents with  . Hypertension   Hypertension: Patient here for follow-up of elevated blood pressure. Patient was restarted on Losartan 100 mg once daily about a month ago. Blood pressure is well controlled at home with an average systolic of 130-140s and average diastolic of 70s. Cardiac symptoms none. Cardiovascular risk factors: diabetes mellitus and hypertension. Use of agents associated with hypertension: none. History of target organ damage: none.  Patient was referred to gastroenterology at our last appointment on 06/22/2019 and she has not yet heard from them.  ROS: Per HPI  Current Outpatient Medications:  .  losartan (COZAAR) 100 MG tablet, Take 1 tablet (100 mg total) by mouth daily., Disp: 90 tablet, Rfl: 1 .  metFORMIN (GLUCOPHAGE) 500 MG tablet, Take 1 tablet (500 mg total) by mouth 2 (two) times daily with a meal., Disp: 60 tablet, Rfl: 2 .  simvastatin (ZOCOR) 20 MG tablet, Take 1 tablet (20 mg total) by mouth daily at 6 PM., Disp: 90 tablet, Rfl: 1  Allergies  Allergen Reactions  . Codeine Nausea Only   Past Medical History:  Diagnosis Date  . Diabetes mellitus   . Difficulty swallowing   .  Diverticulitis   . Essential hypertension     Observations/Objective: A&O  No respiratory distress or wheezing audible over the phone Mood, judgement, and thought processes all WNL  Assessment and Plan: 1. Essential hypertension, benign - Well controlled on current regimen. Patient is almost 64 years of age. We discussed her goal of <150/90 once she turns 64 and < 140/90 right now. She is going to continue to work on diet and exercise to further improve her numbers.   Follow Up Instructions:  I discussed the assessment and treatment plan with the patient. The patient was provided an opportunity to ask questions and all were answered. The patient agreed with the plan and demonstrated an understanding of the instructions.   The patient was advised to call back or seek an in-person evaluation if the symptoms worsen or if the condition fails to improve as anticipated.  The above assessment and management plan was discussed with the patient. The patient verbalized understanding of and has agreed to the management plan. Patient is aware to call the clinic if symptoms persist or worsen. Patient is aware when to return to the clinic for a follow-up visit. Patient educated on when it is appropriate to go to the emergency department.   Time call ended: 10:15 AM  I provided 13 minutes of non-face-to-face time during this encounter.  Deliah Boston, MSN, APRN, FNP-C [facility] 07/25/19

## 2019-07-21 ENCOUNTER — Ambulatory Visit (INDEPENDENT_AMBULATORY_CARE_PROVIDER_SITE_OTHER): Payer: BC Managed Care – PPO | Admitting: Family Medicine

## 2019-07-21 DIAGNOSIS — I10 Essential (primary) hypertension: Secondary | ICD-10-CM | POA: Diagnosis not present

## 2019-07-25 ENCOUNTER — Encounter: Payer: Self-pay | Admitting: Family Medicine

## 2019-08-10 NOTE — Progress Notes (Signed)
I spoke with her this morning and she is aware she will need to call the business office before their office can schedule her an appt. :)

## 2019-09-16 NOTE — Progress Notes (Signed)
Assessment & Plan:  1. Type 2 diabetes mellitus without complication, without long-term current use of insulin (HCC) Lab Results  Component Value Date   HGBA1C 7.1 (H) 09/17/2019   HGBA1C 8.3 (H) 06/22/2019  - Diabetes is not at goal of A1c < 7 but is improving. - Medications: increase metformin from 500 BID to 1,000 mg in the AM and 500 mg in the PM - Home glucose monitoring: continue as needed - Patient is currently taking a statin. Patient is taking an ACE-inhibitor/ARB.  - Last foot exam: 06/22/2019 - Last diabetic eye exam: unknown - Urine Microalbumin/Creat Ratio: 06/22/2019 - Instruction/counseling given: reminded to get eye exam, discussed diet and provided printed educational material - CMP14+EGFR - Lipid panel - Bayer DCA Hb A1c Waived - metFORMIN (GLUCOPHAGE) 500 MG tablet; Take 2 tablets (1,000 mg total) by mouth daily with breakfast AND 1 tablet (500 mg total) every evening.  Dispense: 270 tablet; Refill: 1 - simvastatin (ZOCOR) 20 MG tablet; Take 1 tablet (20 mg total) by mouth daily at 6 PM.  Dispense: 90 tablet; Refill: 1  2. Essential hypertension, benign - Well controlled on current regimen.  - CMP14+EGFR - losartan (COZAAR) 100 MG tablet; Take 1 tablet (100 mg total) by mouth daily.  Dispense: 90 tablet; Refill: 1  3. Lung nodule < 6cm on CT - CT Chest Wo Contrast; Future  4. Family history of lung cancer - CT Chest Wo Contrast; Future  5. Screening for deficiency anemia - CBC with Differential/Platelet   Return in about 3 months (around 12/18/2019) for DM.  Hendricks Limes, MSN, APRN, FNP-C [facility]  Subjective:    Patient ID: Cassie Hunter, female    DOB: 09-May-1956, 64 y.o.   MRN: [id number]  Patient Care Team: Loman Brooklyn, FNP as PCP - General (Family Medicine)   Chief Complaint:  Chief Complaint  Patient presents with  . Diabetes    3 month follow up  . Hypertension    HPI: Cassie Hunter is a 64  y.o. female presenting on 09/17/2019 for Diabetes (3 month follow up) and Hypertension  Diabetes: Patient presents for follow up of diabetes. Current symptoms include: none. Known diabetic complications: none. Medication compliance: yes. Current diet: in general, a "healthy" diet  . Current exercise: walking. Home blood sugar records: BGs are running  consistent with Hgb A1C. Is she  on ACE inhibitor or angiotensin II receptor blocker? Yes. Is she on a statin? Yes.   Lab Results  Component Value Date   HGBA1C 7.1 (H) 09/17/2019   HGBA1C 8.3 (H) 06/22/2019   Lab Results  Component Value Date   CREATININE 0.78 06/22/2019     New complaints: Patient was going through some things at home which triggered her memory that her father had lung cancer before he had pancreatic cancer. She also was looking through her MyChart and noticed a 5 mm right lower lobe pulmonary nodule and became concerned.    Social history:  Relevant past medical, surgical, family and social history reviewed and updated as indicated. Interim medical history since our last visit reviewed.  Allergies and medications reviewed and updated.  DATA REVIEWED: CHART IN EPIC  ROS: Negative unless specifically indicated above in HPI.    Current Outpatient Medications:  .  losartan (COZAAR) 100 MG tablet, Take 1 tablet (100 mg total) by mouth daily., Disp: 90 tablet, Rfl: 1 .  metFORMIN (GLUCOPHAGE) 500 MG tablet, Take 2 tablets (1,000 mg total) by mouth  daily with breakfast AND 1 tablet (500 mg total) every evening., Disp: 270 tablet, Rfl: 1 .  simvastatin (ZOCOR) 20 MG tablet, Take 1 tablet (20 mg total) by mouth daily at 6 PM., Disp: 90 tablet, Rfl: 1   Allergies  Allergen Reactions  . Codeine Nausea Only   Past Medical History:  Diagnosis Date  . Diabetes mellitus   . Difficulty swallowing   . Diverticulitis   . Essential hypertension     Past Surgical History:  Procedure Laterality Date  . ABDOMINAL HYSTERECTOMY     . APPENDECTOMY    . CHOLECYSTECTOMY    . SPINAL CORD DECOMPRESSION  1996    Social History   Socioeconomic History  . Marital status: Married    Spouse name: Not on file  . Number of children: Not on file  . Years of education: Not on file  . Highest education level: Not on file  Occupational History  . Not on file  Tobacco Use  . Smoking status: Never Smoker  . Smokeless tobacco: Never Used  Substance and Sexual Activity  . Alcohol use: No  . Drug use: No  . Sexual activity: Not on file  Other Topics Concern  . Not on file  Social History Narrative  . Not on file   Social Determinants of Health   Financial Resource Strain:   . Difficulty of Paying Living Expenses: Not on file  Food Insecurity:   . Worried About Charity fundraiser in the Last Year: Not on file  . Ran Out of Food in the Last Year: Not on file  Transportation Needs:   . Lack of Transportation (Medical): Not on file  . Lack of Transportation (Non-Medical): Not on file  Physical Activity:   . Days of Exercise per Week: Not on file  . Minutes of Exercise per Session: Not on file  Stress:   . Feeling of Stress : Not on file  Social Connections:   . Frequency of Communication with Friends and Family: Not on file  . Frequency of Social Gatherings with Friends and Family: Not on file  . Attends Religious Services: Not on file  . Active Member of Clubs or Organizations: Not on file  . Attends Archivist Meetings: Not on file  . Marital Status: Not on file  Intimate Partner Violence:   . Fear of Current or Ex-Partner: Not on file  . Emotionally Abused: Not on file  . Physically Abused: Not on file  . Sexually Abused: Not on file        Objective:    BP 117/72   Pulse 81   Temp 98.9 F (37.2 C) (Temporal)   Ht 5' 3"  (1.6 m)   Wt 166 lb 6.4 oz (75.5 kg)   SpO2 96%   BMI 29.48 kg/m   Physical Exam Vitals reviewed.  Constitutional:      General: She is not in acute distress.     Appearance: Normal appearance. She is overweight. She is not ill-appearing, toxic-appearing or diaphoretic.  HENT:     Head: Normocephalic and atraumatic.  Eyes:     General: No scleral icterus.       Right eye: No discharge.        Left eye: No discharge.     Conjunctiva/sclera: Conjunctivae normal.  Cardiovascular:     Rate and Rhythm: Normal rate and regular rhythm.     Heart sounds: Normal heart sounds. No murmur. No friction rub. No gallop.  Pulmonary:     Effort: Pulmonary effort is normal. No respiratory distress.     Breath sounds: Normal breath sounds. No stridor. No wheezing, rhonchi or rales.  Musculoskeletal:        General: Normal range of motion.     Cervical back: Normal range of motion.  Skin:    General: Skin is warm and dry.     Capillary Refill: Capillary refill takes less than 2 seconds.  Neurological:     General: No focal deficit present.     Mental Status: She is alert and oriented to person, place, and time. Mental status is at baseline.  Psychiatric:        Mood and Affect: Mood normal.        Behavior: Behavior normal.        Thought Content: Thought content normal.        Judgment: Judgment normal.    No results found for: TSH Lab Results  Component Value Date   WBC 8.5 11/06/2015   HGB 12.3 11/06/2015   HCT 37.6 11/06/2015   MCV 85.5 11/06/2015   PLT 194 11/06/2015   Lab Results  Component Value Date   NA 139 06/22/2019   K 4.9 06/22/2019   CO2 24 06/22/2019   GLUCOSE 238 (H) 06/22/2019   BUN 9 06/22/2019   CREATININE 0.78 06/22/2019   BILITOT 0.5 06/22/2019   ALKPHOS 130 (H) 06/22/2019   AST 13 06/22/2019   ALT 11 06/22/2019   PROT 6.5 06/22/2019   ALBUMIN 4.0 06/22/2019   CALCIUM 9.7 06/22/2019   ANIONGAP 7 11/06/2015   No results found for: CHOL No results found for: HDL No results found for: LDLCALC No results found for: TRIG No results found for: [facility] Lab Results  Component Value Date   HGBA1C 8.3 (H) 06/22/2019

## 2019-09-17 ENCOUNTER — Encounter: Payer: Self-pay | Admitting: Family Medicine

## 2019-09-17 ENCOUNTER — Other Ambulatory Visit: Payer: Self-pay

## 2019-09-17 ENCOUNTER — Ambulatory Visit (INDEPENDENT_AMBULATORY_CARE_PROVIDER_SITE_OTHER): Payer: BC Managed Care – PPO | Admitting: Family Medicine

## 2019-09-17 VITALS — BP 117/72 | HR 81 | Temp 98.9°F | Ht 63.0 in | Wt 166.4 lb

## 2019-09-17 DIAGNOSIS — Z801 Family history of malignant neoplasm of trachea, bronchus and lung: Secondary | ICD-10-CM | POA: Diagnosis not present

## 2019-09-17 DIAGNOSIS — R911 Solitary pulmonary nodule: Secondary | ICD-10-CM | POA: Diagnosis not present

## 2019-09-17 DIAGNOSIS — I10 Essential (primary) hypertension: Secondary | ICD-10-CM | POA: Diagnosis not present

## 2019-09-17 DIAGNOSIS — E119 Type 2 diabetes mellitus without complications: Secondary | ICD-10-CM

## 2019-09-17 DIAGNOSIS — IMO0001 Reserved for inherently not codable concepts without codable children: Secondary | ICD-10-CM

## 2019-09-17 DIAGNOSIS — Z130 Encounter for screening for diseases of the blood and blood-forming organs and certain disorders involving the immune mechanism: Secondary | ICD-10-CM

## 2019-09-17 LAB — BAYER DCA HB A1C WAIVED: HB A1C (BAYER DCA - WAIVED): 7.1 % — ABNORMAL HIGH (ref <7.0)

## 2019-09-17 MED ORDER — METFORMIN HCL 500 MG PO TABS: ORAL_TABLET | ORAL | 1 refills | Status: DC

## 2019-09-17 MED ORDER — LOSARTAN POTASSIUM 100 MG PO TABS: 100.0000 mg | ORAL_TABLET | Freq: Every day | ORAL | 1 refills | Status: DC

## 2019-09-17 MED ORDER — SIMVASTATIN 20 MG PO TABS: 20.0000 mg | ORAL_TABLET | Freq: Every day | ORAL | 1 refills | Status: DC

## 2019-09-17 NOTE — Patient Instructions (Signed)

## 2019-09-18 LAB — CBC WITH DIFFERENTIAL/PLATELET
Basophils Absolute: 0.1 10*3/uL (ref 0.0–0.2)
Basos: 1 %
EOS (ABSOLUTE): 0.2 10*3/uL (ref 0.0–0.4)
Eos: 4 %
Hematocrit: 38.7 % (ref 34.0–46.6)
Hemoglobin: 13.2 g/dL (ref 11.1–15.9)
Immature Grans (Abs): 0 10*3/uL (ref 0.0–0.1)
Immature Granulocytes: 0 %
Lymphocytes Absolute: 1.4 10*3/uL (ref 0.7–3.1)
Lymphs: 22 %
MCH: 29.8 pg (ref 26.6–33.0)
MCHC: 34.1 g/dL (ref 31.5–35.7)
MCV: 87 fL (ref 79–97)
Monocytes Absolute: 0.4 10*3/uL (ref 0.1–0.9)
Monocytes: 7 %
Neutrophils Absolute: 4.1 10*3/uL (ref 1.4–7.0)
Neutrophils: 66 %
Platelets: 302 10*3/uL (ref 150–450)
RBC: 4.43 x10E6/uL (ref 3.77–5.28)
RDW: 12.6 % (ref 11.7–15.4)
WBC: 6.2 10*3/uL (ref 3.4–10.8)

## 2019-09-18 LAB — CMP14+EGFR
ALT: 9 IU/L (ref 0–32)
AST: 15 IU/L (ref 0–40)
Albumin/Globulin Ratio: 1.6 (ref 1.2–2.2)
Albumin: 3.9 g/dL (ref 3.8–4.8)
Alkaline Phosphatase: 116 IU/L (ref 39–117)
BUN/Creatinine Ratio: 13 (ref 12–28)
BUN: 11 mg/dL (ref 8–27)
Bilirubin Total: 0.5 mg/dL (ref 0.0–1.2)
CO2: 25 mmol/L (ref 20–29)
Calcium: 9.4 mg/dL (ref 8.7–10.3)
Chloride: 100 mmol/L (ref 96–106)
Creatinine, Ser: 0.83 mg/dL (ref 0.57–1.00)
GFR calc Af Amer: 86 mL/min/{1.73_m2} (ref >59)
GFR calc non Af Amer: 75 mL/min/{1.73_m2} (ref >59)
Globulin, Total: 2.4 g/dL (ref 1.5–4.5)
Glucose: 216 mg/dL — ABNORMAL HIGH (ref 65–99)
Potassium: 4.1 mmol/L (ref 3.5–5.2)
Sodium: 140 mmol/L (ref 134–144)
Total Protein: 6.3 g/dL (ref 6.0–8.5)

## 2019-09-18 LAB — LIPID PANEL
Chol/HDL Ratio: 2.4 ratio (ref 0.0–4.4)
Cholesterol, Total: 175 mg/dL (ref 100–199)
HDL: 73 mg/dL (ref >39)
LDL Chol Calc (NIH): 83 mg/dL (ref 0–99)
Triglycerides: 105 mg/dL (ref 0–149)
VLDL Cholesterol Cal: 19 mg/dL (ref 5–40)

## 2019-10-07 ENCOUNTER — Other Ambulatory Visit: Payer: Self-pay

## 2019-10-07 ENCOUNTER — Ambulatory Visit (HOSPITAL_COMMUNITY)
Admission: RE | Admit: 2019-10-07 | Discharge: 2019-10-07 | Disposition: A | Discharge status: Home / Self Care | Payer: BC Managed Care – PPO | Source: Ambulatory Visit | Attending: Family Medicine | Admitting: Family Medicine

## 2019-10-07 DIAGNOSIS — IMO0001 Reserved for inherently not codable concepts without codable children: Secondary | ICD-10-CM

## 2019-10-07 DIAGNOSIS — R911 Solitary pulmonary nodule: Secondary | ICD-10-CM | POA: Insufficient documentation

## 2019-10-07 DIAGNOSIS — Z801 Family history of malignant neoplasm of trachea, bronchus and lung: Secondary | ICD-10-CM | POA: Diagnosis present

## 2019-10-20 ENCOUNTER — Encounter: Payer: Self-pay | Admitting: *Deleted

## 2019-12-23 ENCOUNTER — Ambulatory Visit: Payer: BC Managed Care – PPO | Admitting: Family Medicine

## 2020-01-04 ENCOUNTER — Ambulatory Visit (INDEPENDENT_AMBULATORY_CARE_PROVIDER_SITE_OTHER): Payer: Medicare HMO | Admitting: Family Medicine

## 2020-01-04 ENCOUNTER — Other Ambulatory Visit: Payer: Self-pay

## 2020-01-04 ENCOUNTER — Encounter: Payer: Self-pay | Admitting: Family Medicine

## 2020-01-04 VITALS — BP 135/71 | HR 73 | Temp 97.9°F | Ht 63.0 in | Wt 166.0 lb

## 2020-01-04 DIAGNOSIS — E119 Type 2 diabetes mellitus without complications: Secondary | ICD-10-CM | POA: Diagnosis not present

## 2020-01-04 DIAGNOSIS — I10 Essential (primary) hypertension: Secondary | ICD-10-CM

## 2020-01-04 DIAGNOSIS — I700 Atherosclerosis of aorta: Secondary | ICD-10-CM | POA: Insufficient documentation

## 2020-01-04 DIAGNOSIS — R911 Solitary pulmonary nodule: Secondary | ICD-10-CM

## 2020-01-04 LAB — CMP14+EGFR
ALT: 11 IU/L (ref 0–32)
AST: 17 IU/L (ref 0–40)
Albumin/Globulin Ratio: 1.9 (ref 1.2–2.2)
Albumin: 3.9 g/dL (ref 3.8–4.8)
Alkaline Phosphatase: 110 IU/L (ref 48–121)
BUN/Creatinine Ratio: 9 — ABNORMAL LOW (ref 12–28)
BUN: 7 mg/dL — ABNORMAL LOW (ref 8–27)
Bilirubin Total: 0.4 mg/dL (ref 0.0–1.2)
CO2: 26 mmol/L (ref 20–29)
Calcium: 9.3 mg/dL (ref 8.7–10.3)
Chloride: 104 mmol/L (ref 96–106)
Creatinine, Ser: 0.82 mg/dL (ref 0.57–1.00)
GFR calc Af Amer: 87 mL/min/{1.73_m2} (ref >59)
GFR calc non Af Amer: 76 mL/min/{1.73_m2} (ref >59)
Globulin, Total: 2.1 g/dL (ref 1.5–4.5)
Glucose: 142 mg/dL — ABNORMAL HIGH (ref 65–99)
Potassium: 4.9 mmol/L (ref 3.5–5.2)
Sodium: 141 mmol/L (ref 134–144)
Total Protein: 6 g/dL (ref 6.0–8.5)

## 2020-01-04 LAB — BAYER DCA HB A1C WAIVED: HB A1C (BAYER DCA - WAIVED): 6.9 % (ref <7.0)

## 2020-01-04 NOTE — Patient Instructions (Signed)

## 2020-01-04 NOTE — Addendum Note (Signed)
Addended by: Lorelee Cover C on: 01/04/2020 10:40 AM   Modules accepted: Orders

## 2020-01-04 NOTE — Progress Notes (Signed)
Assessment & Plan:  1. Type 2 diabetes mellitus without complication, without long-term current use of insulin (HCC) A1c 6.9 - Diabetes is at goal of A1c < 7. - Medications: continue current medications - Home glucose monitoring: resume monitoring - Patient is currently taking a statin. Patient is taking an ACE-inhibitor/ARB.  - Last foot exam: 06/22/2019 - Last diabetic eye exam: unknown - encouraged to schedule - Urine Microalbumin/Creat Ratio: 06/22/2019 - Instruction/counseling given: discussed diet and provided printed educational material - CMP14+EGFR  2. Essential hypertension, benign - Well controlled on current regimen.  - CMP14+EGFR   Return in about 3 months (around 04/05/2020) for annual physical.  Hendricks Limes, MSN, APRN, FNP-C [facility]  Subjective:    Patient ID: Cassie Hunter, female    DOB: [birth year], 64 y.o.   MRN: [id number]  Patient Care Team: Loman Brooklyn, FNP as PCP - General (Family Medicine)   Chief Complaint:  Chief Complaint  Patient presents with  . Diabetes    3 month follow up of chronic medical conditions    HPI: Cassie Hunter is a 64 y.o. female presenting on 01/04/2020 for Diabetes (3 month follow up of chronic medical conditions)  Diabetes: Patient presents for follow up of diabetes. Current symptoms include: none. Known diabetic complications: none. Medication compliance: yes. Current diet: tries to eat healthy but slips often. Current exercise: walking (3-3.5 miles per day). Home blood sugar records: has not been checking since vacation. Is she  on ACE inhibitor or angiotensin II receptor blocker? Yes. Is she on a statin? Yes.   A1c 6.9 today, improved from 7.1 on 09/17/2019.   Lab Results  Component Value Date   LDLCALC 83 09/17/2019   CREATININE 0.83 09/17/2019      New complaints: None  Social history:  Relevant past medical, surgical, family and social history reviewed and updated  as indicated. Interim medical history since our last visit reviewed.  Allergies and medications reviewed and updated.  DATA REVIEWED: CHART IN EPIC  ROS: Negative unless specifically indicated above in HPI.    Current Outpatient Medications:  .  losartan (COZAAR) 100 MG tablet, Take 1 tablet (100 mg total) by mouth daily., Disp: 90 tablet, Rfl: 1 .  metFORMIN (GLUCOPHAGE) 500 MG tablet, Take 2 tablets (1,000 mg total) by mouth daily with breakfast AND 1 tablet (500 mg total) every evening., Disp: 270 tablet, Rfl: 1 .  simvastatin (ZOCOR) 20 MG tablet, Take 1 tablet (20 mg total) by mouth daily at 6 PM., Disp: 90 tablet, Rfl: 1   Allergies  Allergen Reactions  . Codeine Nausea Only   Past Medical History:  Diagnosis Date  . Diabetes mellitus   . Difficulty swallowing   . Diverticulitis   . Essential hypertension     Past Surgical History:  Procedure Laterality Date  . ABDOMINAL HYSTERECTOMY    . APPENDECTOMY    . CHOLECYSTECTOMY    . SPINAL CORD DECOMPRESSION  1996    Social History   Socioeconomic History  . Marital status: Married    Spouse name: Not on file  . Number of children: Not on file  . Years of education: Not on file  . Highest education level: Not on file  Occupational History  . Not on file  Tobacco Use  . Smoking status: Never Smoker  . Smokeless tobacco: Never Used  Vaping Use  . Vaping Use: Never used  Substance and Sexual Activity  . Alcohol use: No  .  Drug use: No  . Sexual activity: Not on file  Other Topics Concern  . Not on file  Social History Narrative  . Not on file   Social Determinants of Health   Financial Resource Strain:   . Difficulty of Paying Living Expenses:   Food Insecurity:   . Worried About Charity fundraiser in the Last Year:   . Arboriculturist in the Last Year:   Transportation Needs:   . Film/video editor (Medical):   Marland Kitchen Lack of Transportation (Non-Medical):   Physical Activity:   . Days of Exercise per  Week:   . Minutes of Exercise per Session:   Stress:   . Feeling of Stress :   Social Connections:   . Frequency of Communication with Friends and Family:   . Frequency of Social Gatherings with Friends and Family:   . Attends Religious Services:   . Active Member of Clubs or Organizations:   . Attends Archivist Meetings:   Marland Kitchen Marital Status:   Intimate Partner Violence:   . Fear of Current or Ex-Partner:   . Emotionally Abused:   Marland Kitchen Physically Abused:   . Sexually Abused:         Objective:    BP 135/71   Pulse 73   Temp 97.9 F (36.6 C) (Temporal)   Ht _0  (1.6 m)   Wt 166 lb (75.3 kg)   SpO2 98%   BMI 29.41 kg/m   Wt Readings from Last 3 Encounters:  01/04/20 166 lb (75.3 kg)  09/17/19 166 lb 6.4 oz (75.5 kg)  06/22/19 167 lb 3.2 oz (75.8 kg)    Physical Exam Vitals reviewed.  Constitutional:      General: She is not in acute distress.    Appearance: Normal appearance. She is overweight. She is not ill-appearing, toxic-appearing or diaphoretic.  HENT:     Head: Normocephalic and atraumatic.  Eyes:     General: No scleral icterus.       Right eye: No discharge.        Left eye: No discharge.     Conjunctiva/sclera: Conjunctivae normal.  Cardiovascular:     Rate and Rhythm: Normal rate and regular rhythm.     Heart sounds: Normal heart sounds. No murmur heard.  No friction rub. No gallop.   Pulmonary:     Effort: Pulmonary effort is normal. No respiratory distress.     Breath sounds: Normal breath sounds. No stridor. No wheezing, rhonchi or rales.  Musculoskeletal:        General: Normal range of motion.     Cervical back: Normal range of motion.  Skin:    General: Skin is warm and dry.     Capillary Refill: Capillary refill takes less than 2 seconds.  Neurological:     General: No focal deficit present.     Mental Status: She is alert and oriented to person, place, and time. Mental status is at baseline.  Psychiatric:        Mood and  Affect: Mood normal.        Behavior: Behavior normal.        Thought Content: Thought content normal.        Judgment: Judgment normal.     No results found for: TSH Lab Results  Component Value Date   WBC 6.2 09/17/2019   HGB 13.2 09/17/2019   HCT 38.7 09/17/2019   MCV 87 09/17/2019   PLT 302 09/17/2019  Lab Results  Component Value Date   NA 140 09/17/2019   K 4.1 09/17/2019   CO2 25 09/17/2019   GLUCOSE 216 (H) 09/17/2019   BUN 11 09/17/2019   CREATININE 0.83 09/17/2019   BILITOT 0.5 09/17/2019   ALKPHOS 116 09/17/2019   AST 15 09/17/2019   ALT 9 09/17/2019   PROT 6.3 09/17/2019   ALBUMIN 3.9 09/17/2019   CALCIUM 9.4 09/17/2019   ANIONGAP 7 11/06/2015   Lab Results  Component Value Date   CHOL 175 09/17/2019   Lab Results  Component Value Date   HDL 73 09/17/2019   Lab Results  Component Value Date   LDLCALC 83 09/17/2019   Lab Results  Component Value Date   TRIG 105 09/17/2019   Lab Results  Component Value Date   CHOLHDL 2.4 09/17/2019   Lab Results  Component Value Date   HGBA1C 7.1 (H) 09/17/2019

## 2020-02-03 DIAGNOSIS — M19072 Primary osteoarthritis, left ankle and foot: Secondary | ICD-10-CM | POA: Diagnosis not present

## 2020-02-03 DIAGNOSIS — M25572 Pain in left ankle and joints of left foot: Secondary | ICD-10-CM | POA: Diagnosis not present

## 2020-03-01 DIAGNOSIS — H5200 Hypermetropia, unspecified eye: Secondary | ICD-10-CM | POA: Diagnosis not present

## 2020-03-01 DIAGNOSIS — I10 Essential (primary) hypertension: Secondary | ICD-10-CM | POA: Diagnosis not present

## 2020-03-01 DIAGNOSIS — E119 Type 2 diabetes mellitus without complications: Secondary | ICD-10-CM | POA: Diagnosis not present

## 2020-03-01 DIAGNOSIS — E7800 Pure hypercholesterolemia, unspecified: Secondary | ICD-10-CM | POA: Diagnosis not present

## 2020-03-01 DIAGNOSIS — H25813 Combined forms of age-related cataract, bilateral: Secondary | ICD-10-CM | POA: Diagnosis not present

## 2020-03-01 LAB — HM DIABETES EYE EXAM

## 2020-03-23 ENCOUNTER — Other Ambulatory Visit: Payer: Self-pay | Admitting: Family Medicine

## 2020-03-23 DIAGNOSIS — I10 Essential (primary) hypertension: Secondary | ICD-10-CM

## 2020-03-23 DIAGNOSIS — E119 Type 2 diabetes mellitus without complications: Secondary | ICD-10-CM

## 2020-04-11 ENCOUNTER — Encounter: Payer: Medicare HMO | Admitting: Family Medicine

## 2020-04-13 ENCOUNTER — Other Ambulatory Visit: Payer: Self-pay

## 2020-04-13 ENCOUNTER — Encounter: Payer: Self-pay | Admitting: Family Medicine

## 2020-04-13 ENCOUNTER — Ambulatory Visit (INDEPENDENT_AMBULATORY_CARE_PROVIDER_SITE_OTHER): Payer: Medicare HMO | Admitting: Family Medicine

## 2020-04-13 VITALS — BP 148/79 | HR 91 | Temp 96.8°F | Ht 63.0 in | Wt 167.2 lb

## 2020-04-13 DIAGNOSIS — E119 Type 2 diabetes mellitus without complications: Secondary | ICD-10-CM | POA: Diagnosis not present

## 2020-04-13 DIAGNOSIS — I700 Atherosclerosis of aorta: Secondary | ICD-10-CM

## 2020-04-13 DIAGNOSIS — Z0001 Encounter for general adult medical examination with abnormal findings: Secondary | ICD-10-CM

## 2020-04-13 DIAGNOSIS — I10 Essential (primary) hypertension: Secondary | ICD-10-CM | POA: Diagnosis not present

## 2020-04-13 DIAGNOSIS — Z780 Asymptomatic menopausal state: Secondary | ICD-10-CM | POA: Diagnosis not present

## 2020-04-13 DIAGNOSIS — Z0000 Encounter for general adult medical examination without abnormal findings: Secondary | ICD-10-CM

## 2020-04-13 DIAGNOSIS — Z1211 Encounter for screening for malignant neoplasm of colon: Secondary | ICD-10-CM

## 2020-04-13 LAB — BAYER DCA HB A1C WAIVED: HB A1C (BAYER DCA - WAIVED): 6.2 % (ref <7.0)

## 2020-04-13 NOTE — Patient Instructions (Signed)
Preventive Care 38 Years and Older, Female Preventive care refers to lifestyle choices and visits with your health care provider that can promote health and wellness. This includes:  A yearly physical exam. This is also called an annual well check.  Regular dental and eye exams.  Immunizations.  Screening for certain conditions.  Healthy lifestyle choices, such as diet and exercise. What can I expect for my preventive care visit? Physical exam Your health care provider will check:  Height and weight. These may be used to calculate body mass index (BMI), which is a measurement that tells if you are at a healthy weight.  Heart rate and blood pressure.  Your skin for abnormal spots. Counseling Your health care provider may ask you questions about:  Alcohol, tobacco, and drug use.  Emotional well-being.  Home and relationship well-being.  Sexual activity.  Eating habits.  History of falls.  Memory and ability to understand (cognition).  Work and work Statistician.  Pregnancy and menstrual history. What immunizations do I need?  Influenza (flu) vaccine  This is recommended every year. Tetanus, diphtheria, and pertussis (Tdap) vaccine  You may need a Td booster every 10 years. Varicella (chickenpox) vaccine  You may need this vaccine if you have not already been vaccinated. Zoster (shingles) vaccine  You may need this after age 33. Pneumococcal conjugate (PCV13) vaccine  One dose is recommended after age 33. Pneumococcal polysaccharide (PPSV23) vaccine  One dose is recommended after age 72. Measles, mumps, and rubella (MMR) vaccine  You may need at least one dose of MMR if you were born in 1957 or later. You may also need a second dose. Meningococcal conjugate (MenACWY) vaccine  You may need this if you have certain conditions. Hepatitis A vaccine  You may need this if you have certain conditions or if you travel or work in places where you may be exposed  to hepatitis A. Hepatitis B vaccine  You may need this if you have certain conditions or if you travel or work in places where you may be exposed to hepatitis B. Haemophilus influenzae type b (Hib) vaccine  You may need this if you have certain conditions. You may receive vaccines as individual doses or as more than one vaccine together in one shot (combination vaccines). Talk with your health care provider about the risks and benefits of combination vaccines. What tests do I need? Blood tests  Lipid and cholesterol levels. These may be checked every 5 years, or more frequently depending on your overall health.  Hepatitis C test.  Hepatitis B test. Screening  Lung cancer screening. You may have this screening every year starting at age 39 if you have a 30-pack-year history of smoking and currently smoke or have quit within the past 15 years.  Colorectal cancer screening. All adults should have this screening starting at age 36 and continuing until age 15. Your health care provider may recommend screening at age 23 if you are at increased risk. You will have tests every 1-10 years, depending on your results and the type of screening test.  Diabetes screening. This is done by checking your blood sugar (glucose) after you have not eaten for a while (fasting). You may have this done every 1-3 years.  Mammogram. This may be done every 1-2 years. Talk with your health care provider about how often you should have regular mammograms.  BRCA-related cancer screening. This may be done if you have a family history of breast, ovarian, tubal, or peritoneal cancers.  Other tests  Sexually transmitted disease (STD) testing.  Bone density scan. This is done to screen for osteoporosis. You may have this done starting at age 65. Follow these instructions at home: Eating and drinking  Eat a diet that includes fresh fruits and vegetables, whole grains, lean protein, and low-fat dairy products. Limit  your intake of foods with high amounts of sugar, saturated fats, and salt.  Take vitamin and mineral supplements as recommended by your health care provider.  Do not drink alcohol if your health care provider tells you not to drink.  If you drink alcohol: ? Limit how much you have to 0-1 drink a day. ? Be aware of how much alcohol is in your drink. In the [place] one drink equals one 12 oz bottle of beer (355 mL), one 5 oz glass of wine (148 mL), or one 1 oz glass of hard liquor (44 mL). Lifestyle  Take daily care of your teeth and gums.  Stay active. Exercise for at least 30 minutes on 5 or more days each week.  Do not use any products that contain nicotine or tobacco, such as cigarettes, e-cigarettes, and chewing tobacco. If you need help quitting, ask your health care provider.  If you are sexually active, practice safe sex. Use a condom or other form of protection in order to prevent STIs (sexually transmitted infections).  Talk with your health care provider about taking a low-dose aspirin or statin. What's next?  Go to your health care provider once a year for a well check visit.  Ask your health care provider how often you should have your eyes and teeth checked.  Stay up to date on all vaccines. This information is not intended to replace advice given to you by your health care provider. Make sure you discuss any questions you have with your health care provider. Document Revised: 06/25/2018 Document Reviewed: 06/25/2018 Elsevier Patient Education  2020 [employer]   

## 2020-04-13 NOTE — Progress Notes (Signed)
Assessment & Plan:  1. Well adult exam - Preventive health education provided. Patient will return for nurse visit for influenza and pneumonia vaccines; she is not getting them today as it has not been 2 weeks since her last COVID-19 vaccine.  Shingrix is at the pharmacy for administration.  We will request the record for her eye exam.  Cologuard has been ordered.  DEXA scan has been ordered.  Patient is up-to-date with her mammogram, TDAP, hepatitis C screening and COVID-19 vaccines.  Patient declines HIV screening.  2. Type 2 diabetes mellitus without complication, without long-term current use of insulin (HCC) Lab Results  Component Value Date   HGBA1C 6.2 04/13/2020   HGBA1C 6.9 01/04/2020   HGBA1C 7.1 (H) 09/17/2019  - Diabetes is at goal of A1c < 7. - Medications: continue current medications - Patient is currently taking a statin. Patient is taking an ACE-inhibitor/ARB.  - Last foot exam: 06/22/2019 - Last diabetic eye exam: August 2021 - we are requesting this record - Urine Microalbumin/Creat Ratio: 06/22/2019 - Instruction/counseling given: discussed diet - Bayer DCA Hb A1c Waived - CMP14+EGFR - simvastatin (ZOCOR) 20 MG tablet; Take 1 tablet (20 mg total) by mouth daily at 6 PM.  Dispense: 90 tablet; Refill: 1 - losartan (COZAAR) 100 MG tablet; Take 1 tablet (100 mg total) by mouth daily.  Dispense: 90 tablet; Refill: 1 - metFORMIN (GLUCOPHAGE) 500 MG tablet; Take 2 tablets (1,000 mg total) by mouth daily with breakfast AND 1 tablet (500 mg total) daily with supper.  Dispense: 270 tablet; Refill: 1  3. Essential hypertension, benign - Well controlled on current regimen.  - losartan (COZAAR) 100 MG tablet; Take 1 tablet (100 mg total) by mouth daily.  Dispense: 90 tablet; Refill: 1  4. Aortic atherosclerosis ([place]) - Patient is on statin therapy.  5. Colon cancer screening - Cologuard  6. Postmenopausal estrogen deficiency - DG [facility] DEXA   Follow-up: Return in about 6  months (around 10/11/2020) for follow-up of chronic medication conditions.   Hendricks Limes, MSN, APRN, FNP-C [facility]  Subjective:  Patient ID: Cassie Hunter, female    DOB: 11-29-1955  Age: 64 y.o. MRN: [id number]  Patient Care Team: Loman Brooklyn, FNP as PCP - General (Family Medicine)   CC:  Chief Complaint  Patient presents with  . Annual Exam    HPI Cassie Hunter presents for her annual physical.  Marital status: Married, Substance use: None Diet: Tries to eat healthy, Exercise: Limited currently due to left ankle pain Last eye exam: Last month at [place] dental exam: A long time ago Last colonoscopy: due - patient agreeable to Cologuard Last mammogram: 06/21/2019 - mammogram this December should be a bilateral diagnostic with a left breast ultrasound DEXA: Never - to be ordered today Hepatitis C Screening: 01/02/2017 Immunizations: Flu Vaccine: Will complete in a few weeks as it has not been 2 weeks since her second COVID-19 vaccine Tdap Vaccine: up to date  Shingrix Vaccine: at the pharmacy for administration, so it can be run through insurance first  COVID-19 Vaccine: up to date Pneumonia Vaccine: We will get in a few weeks as it has not been 2 weeks since her last COVID-19 vaccine  DEPRESSION SCREENING PHQ 2/9 Scores 04/13/2020 01/04/2020 09/17/2019 06/22/2019  PHQ - 2 Score 0 0 0 0     Diabetes: Patient presents for follow up of diabetes. Current symptoms include: none. Known diabetic complications: none. Medication compliance: yes. Current diet:  in general, a "healthy" diet  . Current exercise: none. Is she  on ACE inhibitor or angiotensin II receptor blocker? Yes. Is she on a statin? Yes.   Lab Results  Component Value Date   HGBA1C 6.2 04/13/2020   HGBA1C 6.9 01/04/2020   HGBA1C 7.1 (H) 09/17/2019   Lab Results  Component Value Date   LDLCALC 83 09/17/2019   CREATININE 0.82 01/04/2020     Review of Systems   Constitutional: Negative for chills, fever, malaise/fatigue and weight loss.  HENT: Negative for congestion, ear discharge, ear pain, nosebleeds, sinus pain, sore throat and tinnitus.   Eyes: Negative for blurred vision, double vision, pain, discharge and redness.  Respiratory: Negative for cough, shortness of breath and wheezing.   Cardiovascular: Negative for chest pain, palpitations and leg swelling.  Gastrointestinal: Negative for abdominal pain, constipation, diarrhea, heartburn, nausea and vomiting.  Genitourinary: Negative for dysuria, frequency and urgency.  Musculoskeletal: Positive for joint pain. Negative for myalgias.  Skin: Negative for rash.  Neurological: Negative for dizziness, seizures, weakness and headaches.  Psychiatric/Behavioral: Negative for depression, substance abuse and suicidal ideas. The patient is not nervous/anxious.      Current Outpatient Medications:  .  losartan (COZAAR) 100 MG tablet, Take 1 tablet by mouth once daily, Disp: 90 tablet, Rfl: 0 .  metFORMIN (GLUCOPHAGE) 500 MG tablet, TAKE 2 TABLETS BY MOUTH ONCE DAILY WITH BREAKFAST AND 1 IN THE EVENING, Disp: 270 tablet, Rfl: 0 .  simvastatin (ZOCOR) 20 MG tablet, Take 1 tablet (20 mg total) by mouth daily at 6 PM., Disp: 90 tablet, Rfl: 1  Allergies  Allergen Reactions  . Codeine Nausea Only    Past Medical History:  Diagnosis Date  . Diabetes mellitus   . Difficulty swallowing   . Diverticulitis   . Essential hypertension     Past Surgical History:  Procedure Laterality Date  . ABDOMINAL HYSTERECTOMY    . APPENDECTOMY    . CHOLECYSTECTOMY    . SPINAL CORD DECOMPRESSION  1996    Family History  Problem Relation Age of Onset  . Pancreatic cancer Father   . Lung cancer Father   . CVA Mother   . Spina bifida Sister   . Heart attack Maternal Grandmother   . Heart attack Maternal Grandfather   . Stroke Paternal Grandmother   . Aneurysm Paternal Grandfather     Social History    Socioeconomic History  . Marital status: Married    Spouse name: Not on file  . Number of children: Not on file  . Years of education: Not on file  . Highest education level: Not on file  Occupational History  . Not on file  Tobacco Use  . Smoking status: Never Smoker  . Smokeless tobacco: Never Used  Vaping Use  . Vaping Use: Never used  Substance and Sexual Activity  . Alcohol use: No  . Drug use: No  . Sexual activity: Not on file  Other Topics Concern  . Not on file  Social History Narrative  . Not on file   Social Determinants of Health   Financial Resource Strain:   . Difficulty of Paying Living Expenses: Not on file  Food Insecurity:   . Worried About Charity fundraiser in the Last Year: Not on file  . Ran Out of Food in the Last Year: Not on file  Transportation Needs:   . Lack of Transportation (Medical): Not on file  . Lack of Transportation (Non-Medical): Not on file  Physical Activity:   . Days of Exercise per Week: Not on file  . Minutes of Exercise per Session: Not on file  Stress:   . Feeling of Stress : Not on file  Social Connections:   . Frequency of Communication with Friends and Family: Not on file  . Frequency of Social Gatherings with Friends and Family: Not on file  . Attends Religious Services: Not on file  . Active Member of Clubs or Organizations: Not on file  . Attends Archivist Meetings: Not on file  . Marital Status: Not on file  Intimate Partner Violence:   . Fear of Current or Ex-Partner: Not on file  . Emotionally Abused: Not on file  . Physically Abused: Not on file  . Sexually Abused: Not on file      Objective:    BP (!) 148/79   Pulse 91   Temp (!) 96.8 F (36 C) (Temporal)   Ht _0  (1.6 m)   Wt 167 lb 3.2 oz (75.8 kg)   SpO2 98%   BMI 29.62 kg/m   Wt Readings from Last 3 Encounters:  04/13/20 167 lb 3.2 oz (75.8 kg)  01/04/20 166 lb (75.3 kg)  09/17/19 166 lb 6.4 oz (75.5 kg)    Physical  Exam Vitals reviewed.  Constitutional:      General: She is not in acute distress.    Appearance: Normal appearance. She is overweight. She is not ill-appearing, toxic-appearing or diaphoretic.  HENT:     Head: Normocephalic and atraumatic.     Right Ear: Tympanic membrane, ear canal and external ear normal. There is no impacted cerumen.     Left Ear: Tympanic membrane, ear canal and external ear normal. There is no impacted cerumen.     Nose: Nose normal. No congestion or rhinorrhea.     Mouth/Throat:     Mouth: Mucous membranes are moist.     Pharynx: Oropharynx is clear. No oropharyngeal exudate or posterior oropharyngeal erythema.  Eyes:     General: No scleral icterus.       Right eye: No discharge.        Left eye: No discharge.     Conjunctiva/sclera: Conjunctivae normal.     Pupils: Pupils are equal, round, and reactive to light.  Cardiovascular:     Rate and Rhythm: Normal rate and regular rhythm.     Heart sounds: Normal heart sounds. No murmur heard.  No friction rub. No gallop.   Pulmonary:     Effort: Pulmonary effort is normal. No respiratory distress.     Breath sounds: Normal breath sounds. No stridor. No wheezing, rhonchi or rales.  Abdominal:     General: Abdomen is flat. Bowel sounds are normal. There is no distension.     Palpations: Abdomen is soft. There is no mass.     Tenderness: There is no abdominal tenderness. There is no guarding or rebound.     Hernia: No hernia is present.  Musculoskeletal:        General: Normal range of motion.     Cervical back: Normal range of motion and neck supple. No rigidity. No muscular tenderness.  Lymphadenopathy:     Cervical: No cervical adenopathy.  Skin:    General: Skin is warm and dry.     Capillary Refill: Capillary refill takes less than 2 seconds.  Neurological:     General: No focal deficit present.     Mental Status: She is alert and oriented to  person, place, and time. Mental status is at baseline.   Psychiatric:        Mood and Affect: Mood normal.        Behavior: Behavior normal.        Thought Content: Thought content normal.        Judgment: Judgment normal.     No results found for: TSH Lab Results  Component Value Date   WBC 6.2 09/17/2019   HGB 13.2 09/17/2019   HCT 38.7 09/17/2019   MCV 87 09/17/2019   PLT 302 09/17/2019   Lab Results  Component Value Date   NA 141 01/04/2020   K 4.9 01/04/2020   CO2 26 01/04/2020   GLUCOSE 142 (H) 01/04/2020   BUN 7 (L) 01/04/2020   CREATININE 0.82 01/04/2020   BILITOT 0.4 01/04/2020   ALKPHOS 110 01/04/2020   AST 17 01/04/2020   ALT 11 01/04/2020   PROT 6.0 01/04/2020   ALBUMIN 3.9 01/04/2020   CALCIUM 9.3 01/04/2020   ANIONGAP 7 11/06/2015   Lab Results  Component Value Date   CHOL 175 09/17/2019   Lab Results  Component Value Date   HDL 73 09/17/2019   Lab Results  Component Value Date   LDLCALC 83 09/17/2019   Lab Results  Component Value Date   TRIG 105 09/17/2019   Lab Results  Component Value Date   CHOLHDL 2.4 09/17/2019   Lab Results  Component Value Date   HGBA1C 6.9 01/04/2020

## 2020-04-14 LAB — CMP14+EGFR
ALT: 11 IU/L (ref 0–32)
AST: 18 IU/L (ref 0–40)
Albumin/Globulin Ratio: 1.9 (ref 1.2–2.2)
Albumin: 4.1 g/dL (ref 3.8–4.8)
Alkaline Phosphatase: 113 IU/L (ref 44–121)
BUN/Creatinine Ratio: 12 (ref 12–28)
BUN: 9 mg/dL (ref 8–27)
Bilirubin Total: 0.4 mg/dL (ref 0.0–1.2)
CO2: 23 mmol/L (ref 20–29)
Calcium: 9.4 mg/dL (ref 8.7–10.3)
Chloride: 102 mmol/L (ref 96–106)
Creatinine, Ser: 0.77 mg/dL (ref 0.57–1.00)
GFR calc Af Amer: 94 mL/min/{1.73_m2} (ref >59)
GFR calc non Af Amer: 81 mL/min/{1.73_m2} (ref >59)
Globulin, Total: 2.2 g/dL (ref 1.5–4.5)
Glucose: 152 mg/dL — ABNORMAL HIGH (ref 65–99)
Potassium: 4.4 mmol/L (ref 3.5–5.2)
Sodium: 139 mmol/L (ref 134–144)
Total Protein: 6.3 g/dL (ref 6.0–8.5)

## 2020-04-16 MED ORDER — SIMVASTATIN 20 MG PO TABS: 20.0000 mg | ORAL_TABLET | Freq: Every day | ORAL | 1 refills | Status: DC

## 2020-04-16 MED ORDER — METFORMIN HCL 500 MG PO TABS: ORAL_TABLET | ORAL | 1 refills | Status: DC

## 2020-04-16 MED ORDER — LOSARTAN POTASSIUM 100 MG PO TABS: 100.0000 mg | ORAL_TABLET | Freq: Every day | ORAL | 1 refills | Status: DC

## 2020-04-30 DIAGNOSIS — Z1211 Encounter for screening for malignant neoplasm of colon: Secondary | ICD-10-CM | POA: Diagnosis not present

## 2020-05-08 ENCOUNTER — Ambulatory Visit (INDEPENDENT_AMBULATORY_CARE_PROVIDER_SITE_OTHER): Payer: Medicare HMO

## 2020-05-08 ENCOUNTER — Other Ambulatory Visit: Payer: Self-pay

## 2020-05-08 DIAGNOSIS — Z780 Asymptomatic menopausal state: Secondary | ICD-10-CM | POA: Diagnosis not present

## 2020-05-09 LAB — COLOGUARD: COLOGUARD: NEGATIVE

## 2020-05-10 ENCOUNTER — Other Ambulatory Visit: Payer: Self-pay | Admitting: *Deleted

## 2020-05-10 DIAGNOSIS — E119 Type 2 diabetes mellitus without complications: Secondary | ICD-10-CM

## 2020-05-10 DIAGNOSIS — I10 Essential (primary) hypertension: Secondary | ICD-10-CM

## 2020-05-10 MED ORDER — METFORMIN HCL 500 MG PO TABS: ORAL_TABLET | ORAL | 1 refills | Status: DC

## 2020-05-10 MED ORDER — LOSARTAN POTASSIUM 100 MG PO TABS: 100.0000 mg | ORAL_TABLET | Freq: Every day | ORAL | 1 refills | Status: DC

## 2020-05-10 MED ORDER — SIMVASTATIN 20 MG PO TABS: 20.0000 mg | ORAL_TABLET | Freq: Every day | ORAL | 1 refills | Status: DC

## 2020-05-11 ENCOUNTER — Other Ambulatory Visit: Payer: Self-pay | Admitting: *Deleted

## 2020-05-11 MED ORDER — TRUE METRIX BLOOD GLUCOSE TEST VI STRP: ORAL_STRIP | 12 refills | Status: DC

## 2020-05-11 MED ORDER — TRUE METRIX LEVEL 1 LOW VI SOLN: 3 refills | Status: AC

## 2020-05-11 MED ORDER — BD SWAB SINGLE USE REGULAR PADS: MEDICATED_PAD | 0 refills | Status: DC

## 2020-05-11 MED ORDER — TRUEPLUS LANCETS 33G MISC: 3 refills | Status: DC

## 2020-05-11 MED ORDER — TRUE METRIX AIR GLUCOSE METER W/DEVICE KIT: PACK | 0 refills | Status: DC

## 2020-05-11 NOTE — Telephone Encounter (Signed)
New scripts for patient. Please add directions for each script. Dx is already there.

## 2020-05-11 NOTE — Addendum Note (Signed)
Addended by: Julious Payer D on: 05/11/2020 03:26 PM   Modules accepted: Orders

## 2020-05-15 MED ORDER — TRUE METRIX AIR GLUCOSE METER W/DEVICE KIT: PACK | 0 refills | Status: DC

## 2020-05-15 MED ORDER — TRUEPLUS LANCETS 33G MISC
3 refills | Status: DC
Start: 2020-05-15 — End: 2023-01-13

## 2020-05-15 MED ORDER — TRUE METRIX BLOOD GLUCOSE TEST VI STRP
ORAL_STRIP | 3 refills | Status: DC
Start: 2020-05-15 — End: 2023-01-13

## 2020-05-15 MED ORDER — BD SWAB SINGLE USE REGULAR PADS: MEDICATED_PAD | 3 refills | Status: DC

## 2020-05-15 NOTE — Addendum Note (Signed)
Addended by: Julious Payer D on: 05/15/2020 02:29 PM   Modules accepted: Orders

## 2020-05-22 LAB — COLOGUARD: Cologuard: NEGATIVE

## 2020-05-23 ENCOUNTER — Other Ambulatory Visit: Payer: Self-pay | Admitting: *Deleted

## 2020-06-13 ENCOUNTER — Other Ambulatory Visit: Payer: Self-pay | Admitting: Family Medicine

## 2020-06-13 DIAGNOSIS — N63 Unspecified lump in unspecified breast: Secondary | ICD-10-CM

## 2020-06-13 DIAGNOSIS — Z09 Encounter for follow-up examination after completed treatment for conditions other than malignant neoplasm: Secondary | ICD-10-CM

## 2020-06-15 ENCOUNTER — Ambulatory Visit (INDEPENDENT_AMBULATORY_CARE_PROVIDER_SITE_OTHER): Payer: Medicare HMO

## 2020-06-15 ENCOUNTER — Other Ambulatory Visit: Payer: Self-pay

## 2020-06-15 DIAGNOSIS — Z23 Encounter for immunization: Secondary | ICD-10-CM

## 2020-07-19 ENCOUNTER — Other Ambulatory Visit: Payer: Self-pay | Admitting: Family Medicine

## 2020-07-20 ENCOUNTER — Other Ambulatory Visit: Payer: BC Managed Care – PPO

## 2020-08-09 ENCOUNTER — Encounter: Payer: Self-pay | Admitting: Family Medicine

## 2020-08-24 ENCOUNTER — Other Ambulatory Visit: Payer: Self-pay

## 2020-08-24 ENCOUNTER — Ambulatory Visit
Admission: RE | Admit: 2020-08-24 | Discharge: 2020-08-24 | Disposition: A | Discharge status: Home / Self Care | Payer: Medicare HMO | Source: Ambulatory Visit | Attending: Family Medicine | Admitting: Family Medicine

## 2020-08-24 ENCOUNTER — Other Ambulatory Visit: Payer: Self-pay | Admitting: Family Medicine

## 2020-08-24 DIAGNOSIS — N63 Unspecified lump in unspecified breast: Secondary | ICD-10-CM

## 2020-08-24 DIAGNOSIS — Z09 Encounter for follow-up examination after completed treatment for conditions other than malignant neoplasm: Secondary | ICD-10-CM

## 2020-08-24 DIAGNOSIS — N644 Mastodynia: Secondary | ICD-10-CM | POA: Diagnosis not present

## 2020-10-11 ENCOUNTER — Ambulatory Visit: Payer: Medicare HMO | Admitting: Family Medicine

## 2020-10-12 ENCOUNTER — Encounter: Payer: Self-pay | Admitting: Family Medicine

## 2020-10-12 ENCOUNTER — Ambulatory Visit (INDEPENDENT_AMBULATORY_CARE_PROVIDER_SITE_OTHER): Payer: Medicare HMO | Admitting: Family Medicine

## 2020-10-12 ENCOUNTER — Other Ambulatory Visit: Payer: Self-pay

## 2020-10-12 VITALS — BP 164/83 | HR 88 | Temp 97.8°F | Ht 63.0 in | Wt 164.6 lb

## 2020-10-12 DIAGNOSIS — Z23 Encounter for immunization: Secondary | ICD-10-CM | POA: Diagnosis not present

## 2020-10-12 DIAGNOSIS — G8929 Other chronic pain: Secondary | ICD-10-CM | POA: Diagnosis not present

## 2020-10-12 DIAGNOSIS — N6320 Unspecified lump in the left breast, unspecified quadrant: Secondary | ICD-10-CM | POA: Diagnosis not present

## 2020-10-12 DIAGNOSIS — E119 Type 2 diabetes mellitus without complications: Secondary | ICD-10-CM | POA: Diagnosis not present

## 2020-10-12 DIAGNOSIS — I10 Essential (primary) hypertension: Secondary | ICD-10-CM | POA: Diagnosis not present

## 2020-10-12 DIAGNOSIS — M545 Low back pain, unspecified: Secondary | ICD-10-CM | POA: Diagnosis not present

## 2020-10-12 DIAGNOSIS — I700 Atherosclerosis of aorta: Secondary | ICD-10-CM | POA: Diagnosis not present

## 2020-10-12 DIAGNOSIS — R0602 Shortness of breath: Secondary | ICD-10-CM | POA: Diagnosis not present

## 2020-10-12 LAB — BAYER DCA HB A1C WAIVED: HB A1C (BAYER DCA - WAIVED): 6.5 % (ref <7.0)

## 2020-10-12 MED ORDER — LOSARTAN POTASSIUM 100 MG PO TABS: 100.0000 mg | ORAL_TABLET | Freq: Every day | ORAL | 1 refills | Status: DC

## 2020-10-12 MED ORDER — HYDROCHLOROTHIAZIDE 12.5 MG PO TABS: 12.5000 mg | ORAL_TABLET | Freq: Every day | ORAL | 2 refills | Status: DC

## 2020-10-12 MED ORDER — SIMVASTATIN 20 MG PO TABS: 20.0000 mg | ORAL_TABLET | Freq: Every day | ORAL | 1 refills | Status: DC

## 2020-10-12 MED ORDER — METFORMIN HCL 500 MG PO TABS: ORAL_TABLET | ORAL | 1 refills | Status: DC

## 2020-10-12 MED ORDER — ALBUTEROL SULFATE HFA 108 (90 BASE) MCG/ACT IN AERS
2.0000 | INHALATION_SPRAY | Freq: Four times a day (QID) | RESPIRATORY_TRACT | 2 refills | Status: AC | PRN
Start: 2020-10-12 — End: ?

## 2020-10-12 NOTE — Patient Instructions (Signed)
Heat & muscle rub (Biofreeze, [place] Gel, or Horse Liniment) for your back.  Monitor your BP at home and keep a log for our follow-up.   DASH Eating Plan DASH stands for Dietary Approaches to Stop Hypertension. The DASH eating plan is a healthy eating plan that has been shown to:  Reduce high blood pressure (hypertension).  Reduce your risk for type 2 diabetes, heart disease, and stroke.  Help with weight loss. What are tips for following this plan? Reading food labels  Check food labels for the amount of salt (sodium) per serving. Choose foods with less than 5 percent of the Daily Value of sodium. Generally, foods with less than 300 milligrams (mg) of sodium per serving fit into this eating plan.  To find whole grains, look for the word "whole" as the first word in the ingredient list. Shopping  Buy products labeled as "low-sodium" or "no salt added."  Buy fresh foods. Avoid canned foods and pre-made or frozen meals. Cooking  Avoid adding salt when cooking. Use salt-free seasonings or herbs instead of table salt or sea salt. Check with your health care provider or pharmacist before using salt substitutes.  Do not fry foods. Cook foods using healthy methods such as baking, boiling, grilling, roasting, and broiling instead.  Cook with heart-healthy oils, such as olive, canola, avocado, soybean, or sunflower oil. Meal planning  Eat a balanced diet that includes: ? 4 or more servings of fruits and 4 or more servings of vegetables each day. Try to fill one-half of your plate with fruits and vegetables. ? 6-8 servings of whole grains each day. ? Less than 6 oz (170 g) of lean meat, poultry, or fish each day. A 3-oz (85-g) serving of meat is about the same size as a deck of cards. One egg equals 1 oz (28 g). ? 2-3 servings of low-fat dairy each day. One serving is 1 cup (237 mL). ? 1 serving of nuts, seeds, or beans 5 times each week. ? 2-3 servings of heart-healthy fats. Healthy fats  called omega-3 fatty acids are found in foods such as walnuts, flaxseeds, fortified milks, and eggs. These fats are also found in cold-water fish, such as sardines, salmon, and mackerel.  Limit how much you eat of: ? Canned or prepackaged foods. ? Food that is high in trans fat, such as some fried foods. ? Food that is high in saturated fat, such as fatty meat. ? Desserts and other sweets, sugary drinks, and other foods with added sugar. ? Full-fat dairy products.  Do not salt foods before eating.  Do not eat more than 4 egg yolks a week.  Try to eat at least 2 vegetarian meals a week.  Eat more home-cooked food and less restaurant, buffet, and fast food.   Lifestyle  When eating at a restaurant, ask that your food be prepared with less salt or no salt, if possible.  If you drink alcohol: ? Limit how much you use to:  0-1 drink a day for women who are not pregnant.  0-2 drinks a day for men. ? Be aware of how much alcohol is in your drink. In the [place] one drink equals one 12 oz bottle of beer (355 mL), one 5 oz glass of wine (148 mL), or one 1 oz glass of hard liquor (44 mL). General information  Avoid eating more than 2,300 mg of salt a day. If you have hypertension, you may need to reduce your sodium intake to 1,500 mg  a day.  Work with your health care provider to maintain a healthy body weight or to lose weight. Ask what an ideal weight is for you.  Get at least 30 minutes of exercise that causes your heart to beat faster (aerobic exercise) most days of the week. Activities may include walking, swimming, or biking.  Work with your health care provider or dietitian to adjust your eating plan to your individual calorie needs. What foods should I eat? Fruits All fresh, dried, or frozen fruit. Canned fruit in natural juice (without added sugar). Vegetables Fresh or frozen vegetables (raw, steamed, roasted, or grilled). Low-sodium or reduced-sodium tomato and vegetable juice.  Low-sodium or reduced-sodium tomato sauce and tomato paste. Low-sodium or reduced-sodium canned vegetables. Grains Whole-grain or whole-wheat bread. Whole-grain or whole-wheat pasta. Brown rice. Orpah Cobb. Bulgur. Whole-grain and low-sodium cereals. Pita bread. Low-fat, low-sodium crackers. Whole-wheat flour tortillas. Meats and other proteins Skinless chicken or [place]. Ground chicken or [place]. Pork with fat trimmed off. Fish and seafood. Egg whites. Dried beans, peas, or lentils. Unsalted nuts, nut butters, and seeds. Unsalted canned beans. Lean cuts of beef with fat trimmed off. Low-sodium, lean precooked or cured meat, such as sausages or meat loaves. Dairy Low-fat (1%) or fat-free (skim) milk. Reduced-fat, low-fat, or fat-free cheeses. Nonfat, low-sodium ricotta or cottage cheese. Low-fat or nonfat yogurt. Low-fat, low-sodium cheese. Fats and oils Soft margarine without trans fats. Vegetable oil. Reduced-fat, low-fat, or light mayonnaise and salad dressings (reduced-sodium). Canola, safflower, olive, avocado, soybean, and sunflower oils. Avocado. Seasonings and condiments Herbs. Spices. Seasoning mixes without salt. Other foods Unsalted popcorn and pretzels. Fat-free sweets. The items listed above may not be a complete list of foods and beverages you can eat. Contact a dietitian for more information. What foods should I avoid? Fruits Canned fruit in a light or heavy syrup. Fried fruit. Fruit in cream or butter sauce. Vegetables Creamed or fried vegetables. Vegetables in a cheese sauce. Regular canned vegetables (not low-sodium or reduced-sodium). Regular canned tomato sauce and paste (not low-sodium or reduced-sodium). Regular tomato and vegetable juice (not low-sodium or reduced-sodium). Rosita Fire. Olives. Grains Baked goods made with fat, such as croissants, muffins, or some breads. Dry pasta or rice meal packs. Meats and other proteins Fatty cuts of meat. Ribs. Fried meat. Tomasa Blase.  Bologna, salami, and other precooked or cured meats, such as sausages or meat loaves. Fat from the back of a pig (fatback). Bratwurst. Salted nuts and seeds. Canned beans with added salt. Canned or smoked fish. Whole eggs or egg yolks. Chicken or [place] with skin. Dairy Whole or 2% milk, cream, and half-and-half. Whole or full-fat cream cheese. Whole-fat or sweetened yogurt. Full-fat cheese. Nondairy creamers. Whipped toppings. Processed cheese and cheese spreads. Fats and oils Butter. Stick margarine. Lard. Shortening. Ghee. Bacon fat. Tropical oils, such as coconut, palm kernel, or palm oil. Seasonings and condiments Onion salt, garlic salt, seasoned salt, table salt, and sea salt. Worcestershire sauce. Tartar sauce. Barbecue sauce. Teriyaki sauce. Soy sauce, including reduced-sodium. Steak sauce. Canned and packaged gravies. Fish sauce. Oyster sauce. Cocktail sauce. Store-bought horseradish. Ketchup. Mustard. Meat flavorings and tenderizers. Bouillon cubes. Hot sauces. Pre-made or packaged marinades. Pre-made or packaged taco seasonings. Relishes. Regular salad dressings. Other foods Salted popcorn and pretzels. The items listed above may not be a complete list of foods and beverages you should avoid. Contact a dietitian for more information. Where to find more information  National Heart, Lung, and Blood Institute: PopSteam.is  American Heart Association: www.heart.org  Academy  of Nutrition and Dietetics: www.eatright.org  National Kidney Foundation: www.kidney.org Summary  The DASH eating plan is a healthy eating plan that has been shown to reduce high blood pressure (hypertension). It may also reduce your risk for type 2 diabetes, heart disease, and stroke.  When on the DASH eating plan, aim to eat more fresh fruits and vegetables, whole grains, lean proteins, low-fat dairy, and heart-healthy fats.  With the DASH eating plan, you should limit salt (sodium) intake to 2,300 mg a  day. If you have hypertension, you may need to reduce your sodium intake to 1,500 mg a day.  Work with your health care provider or dietitian to adjust your eating plan to your individual calorie needs. This information is not intended to replace advice given to you by your health care provider. Make sure you discuss any questions you have with your health care provider. Document Revised: 06/04/2019 Document Reviewed: 06/04/2019 Elsevier Patient Education  2021 [employer].

## 2020-10-12 NOTE — Progress Notes (Signed)
Assessment & Plan:  1. Type 2 diabetes mellitus without complication, without long-term current use of insulin (HCC) Lab Results  Component Value Date   HGBA1C 6.5 10/12/2020   HGBA1C 6.2 04/13/2020   HGBA1C 6.9 01/04/2020    - Diabetes is at goal of A1c < 7. - Medications: continue current medications - Home glucose monitoring: continue monitoring - Patient is currently taking a statin. Patient is taking an ACE-inhibitor/ARB.  - Instruction/counseling given: discussed foot care and discussed diet  Diabetes Health Maintenance Due  Topic Date Due  . OPHTHALMOLOGY EXAM  03/01/2021  . HEMOGLOBIN A1C  04/13/2021  . FOOT EXAM  10/12/2021    Lab Results  Component Value Date   LABMICR 19.7 06/22/2019   - Lipid panel - CBC with Differential/Platelet - CMP14+EGFR - Bayer DCA Hb A1c Waived - losartan (COZAAR) 100 MG tablet; Take 1 tablet (100 mg total) by mouth daily.  Dispense: 90 tablet; Refill: 1 - metFORMIN (GLUCOPHAGE) 500 MG tablet; Take 2 tablets (1,000 mg total) by mouth daily with breakfast AND 1 tablet (500 mg total) daily with supper.  Dispense: 270 tablet; Refill: 1 - simvastatin (ZOCOR) 20 MG tablet; Take 1 tablet (20 mg total) by mouth daily at 6 PM.  Dispense: 90 tablet; Refill: 1  2. Essential hypertension, benign Uncontrolled. Continue losartan 100 mg QD. Start HCTZ 12.5 mg QD. Low salt diet. Exercise. - Lipid panel - CBC with Differential/Platelet - CMP14+EGFR - losartan (COZAAR) 100 MG tablet; Take 1 tablet (100 mg total) by mouth daily.  Dispense: 90 tablet; Refill: 1 - hydrochlorothiazide (HYDRODIURIL) 12.5 MG tablet; Take 1 tablet (12.5 mg total) by mouth daily.  Dispense: 30 tablet; Refill: 2  3. Aortic atherosclerosis (HCC) On statin. - Lipid panel - CMP14+EGFR - simvastatin (ZOCOR) 20 MG tablet; Take 1 tablet (20 mg total) by mouth daily at 6 PM.  Dispense: 90 tablet; Refill: 1  4. Left breast mass Patient getting established with OBGYN for  management. Due for repeat ultrasound in August.  5. Exertional shortness of breath Rx'd Albuterol - albuterol (VENTOLIN HFA) 108 (90 Base) MCG/ACT inhaler; Inhale 2 puffs into the lungs every 6 (six) hours as needed.  Dispense: 18 g; Refill: 2  6. Chronic left-sided low back pain without sciatica Encouraged muscle rub and heat.  7. Immunization due - Pneumococcal conjugate vaccine 13-valent - given in office.    Return in about 6 weeks (around 11/23/2020) for HTN.  Hendricks Limes, MSN, APRN, FNP-C [facility]  Subjective:    Patient ID: Cassie Hunter, female    DOB: 1956-03-05, 65 y.o.   MRN: [id number]  Patient Care Team: Loman Brooklyn, FNP as PCP - General (Family Medicine)   Chief Complaint:  Chief Complaint  Patient presents with  . Diabetes  . Hyperlipidemia    Check up of chronic med condition.   . Shortness of Breath    Pt states she is having some SOB over a few months.  . Back Pain    Pt states she is having Left Side back pain    HPI: Cassie Hunter is a 65 y.o. female presenting on 10/12/2020 for Diabetes, Hyperlipidemia (Check up of chronic med condition. ), Shortness of Breath (Pt states she is having some SOB over a few months.), and Back Pain (Pt states she is having Left Side back pain)  Diabetes: Patient presents for follow up of diabetes. Current symptoms include: hyperglycemia. Known diabetic complications: none. Medication compliance: yes. Current  diet: sometimes healthy, sometimes not. Current exercise: not as much. Home blood sugar records: BGs range between 120 and 164. Is she  on ACE inhibitor or angiotensin II receptor blocker? Yes. Is she on a statin? Yes.   Hypertension: patient reports her BP at home last week was 153/74; she does not check it very often. She does not add salt to her food.  New complaints: She reports she is getting established with an OBGYN to manage her left breast mass. She endorses pain  and swelling of the breast. She had a diagnostic mammogram with ultrasound on 08/24/2020 which revealed a likely benign left breast mass at 3 o'clock, favored to represent a mildly complicated cyst.   Patient reports shortness of breath that had occurred 2-3 times over the past 2-3 months. It first happened several years ago. The shortness of breath lasts 30-45 minutes when she overexerts herself. Albuterol inhalers are helpful to relieve shortness of breath.   Patient reports left sided back pain that is constant every day that started 3+ months ago. She feels like something is there when she lays on her left side in the bed. Sometimes it has a shooting pain that goes through it. No home treatments.   Social history:  Relevant past medical, surgical, family and social history reviewed and updated as indicated. Interim medical history since our last visit reviewed.  Allergies and medications reviewed and updated.  DATA REVIEWED: CHART IN EPIC  ROS: Negative unless specifically indicated above in HPI.    Current Outpatient Medications:  .  Alcohol Swabs (B-D SINGLE USE SWABS REGULAR) PADS, Test BS daily and as needed Dx E11.9, Disp: 100 each, Rfl: 3 .  Blood Glucose Calibration (TRUE METRIX LEVEL 1) Low SOLN, Use with glucose monitor Dx E11.9, Disp: 3 each, Rfl: 3 .  Blood Glucose Monitoring Suppl (TRUE METRIX AIR GLUCOSE METER) w/Device KIT, Test BS daily and as needed Dx E11.9, Disp: 1 kit, Rfl: 0 .  glucose blood (TRUE METRIX BLOOD GLUCOSE TEST) test strip, Test BS daily and as needed Dx E11.9, Disp: 100 each, Rfl: 3 .  losartan (COZAAR) 100 MG tablet, Take 1 tablet (100 mg total) by mouth daily., Disp: 90 tablet, Rfl: 1 .  metFORMIN (GLUCOPHAGE) 500 MG tablet, Take 2 tablets (1,000 mg total) by mouth daily with breakfast AND 1 tablet (500 mg total) daily with supper., Disp: 270 tablet, Rfl: 1 .  simvastatin (ZOCOR) 20 MG tablet, Take 1 tablet (20 mg total) by mouth daily at 6 PM., Disp: 90  tablet, Rfl: 1 .  TRUEplus Lancets 33G MISC, Test BS daily and as needed Dx E11.9, Disp: 100 each, Rfl: 3   Allergies  Allergen Reactions  . Codeine Nausea Only   Past Medical History:  Diagnosis Date  . Diabetes mellitus   . Difficulty swallowing   . Diverticulitis   . Essential hypertension     Past Surgical History:  Procedure Laterality Date  . ABDOMINAL HYSTERECTOMY    . APPENDECTOMY    . CHOLECYSTECTOMY    . SPINAL CORD DECOMPRESSION  1996    Social History   Socioeconomic History  . Marital status: Married    Spouse name: Not on file  . Number of children: Not on file  . Years of education: Not on file  . Highest education level: Not on file  Occupational History  . Not on file  Tobacco Use  . Smoking status: Never Smoker  . Smokeless tobacco: Never Used  Vaping Use  .  Vaping Use: Never used  Substance and Sexual Activity  . Alcohol use: No  . Drug use: No  . Sexual activity: Not on file  Other Topics Concern  . Not on file  Social History Narrative  . Not on file   Social Determinants of Health   Financial Resource Strain: Not on file  Food Insecurity: Not on file  Transportation Needs: Not on file  Physical Activity: Not on file  Stress: Not on file  Social Connections: Not on file  Intimate Partner Violence: Not on file        Objective:    BP (!) 164/83 (BP Location: Left Arm)   Pulse 88   Temp 97.8 F (36.6 C)   Ht 5' 3"  (1.6 m)   Wt 164 lb 9.6 oz (74.7 kg)   SpO2 97%   BMI 29.16 kg/m   Wt Readings from Last 3 Encounters:  10/12/20 164 lb 9.6 oz (74.7 kg)  04/13/20 167 lb 3.2 oz (75.8 kg)  01/04/20 166 lb (75.3 kg)    Physical Exam Vitals reviewed.  Constitutional:      General: She is not in acute distress.    Appearance: Normal appearance. She is overweight. She is not ill-appearing, toxic-appearing or diaphoretic.  HENT:     Head: Normocephalic and atraumatic.  Eyes:     General: No scleral icterus.       Right eye: No  discharge.        Left eye: No discharge.     Conjunctiva/sclera: Conjunctivae normal.  Cardiovascular:     Rate and Rhythm: Normal rate and regular rhythm.     Heart sounds: Normal heart sounds. No murmur heard. No friction rub. No gallop.   Pulmonary:     Effort: Pulmonary effort is normal. No respiratory distress.     Breath sounds: Normal breath sounds. No stridor. No wheezing, rhonchi or rales.  Musculoskeletal:        General: Normal range of motion.     Cervical back: Normal range of motion.     Lumbar back: Tenderness present. No swelling, edema, deformity, signs of trauma, lacerations, spasms or bony tenderness. Normal range of motion. No scoliosis.       Back:     Comments: Area of tenderness. Muscle tight in that area.   Skin:    General: Skin is warm and dry.     Capillary Refill: Capillary refill takes less than 2 seconds.  Neurological:     General: No focal deficit present.     Mental Status: She is alert and oriented to person, place, and time. Mental status is at baseline.  Psychiatric:        Mood and Affect: Mood normal.        Behavior: Behavior normal.        Thought Content: Thought content normal.        Judgment: Judgment normal.    Diabetic Foot Exam - Simple   Simple Foot Form Diabetic Foot exam was performed with the following findings: Yes 10/12/2020  2:00 PM  Visual Inspection See comments: Yes Sensation Testing See comments: Yes Pulse Check Posterior Tibialis and Dorsalis pulse intact bilaterally: Yes See comments: Yes Comments No deformities or ulcerations. She does have mild maceration between her toes; no other skin breakdown. Right foot is intact to touch and monofilament testing; left is not. Pulse is weaker on the left foot.      No results found for: TSH Lab Results  Component  Value Date   WBC 6.2 09/17/2019   HGB 13.2 09/17/2019   HCT 38.7 09/17/2019   MCV 87 09/17/2019   PLT 302 09/17/2019   Lab Results  Component Value  Date   NA 139 04/13/2020   K 4.4 04/13/2020   CO2 23 04/13/2020   GLUCOSE 152 (H) 04/13/2020   BUN 9 04/13/2020   CREATININE 0.77 04/13/2020   BILITOT 0.4 04/13/2020   ALKPHOS 113 04/13/2020   AST 18 04/13/2020   ALT 11 04/13/2020   PROT 6.3 04/13/2020   ALBUMIN 4.1 04/13/2020   CALCIUM 9.4 04/13/2020   ANIONGAP 7 11/06/2015   Lab Results  Component Value Date   CHOL 175 09/17/2019   Lab Results  Component Value Date   HDL 73 09/17/2019   Lab Results  Component Value Date   LDLCALC 83 09/17/2019   Lab Results  Component Value Date   TRIG 105 09/17/2019   Lab Results  Component Value Date   CHOLHDL 2.4 09/17/2019   Lab Results  Component Value Date   HGBA1C 6.2 04/13/2020

## 2020-10-13 LAB — CBC WITH DIFFERENTIAL/PLATELET
Basophils Absolute: 0.1 10*3/uL (ref 0.0–0.2)
Basos: 1 %
EOS (ABSOLUTE): 0.5 10*3/uL — ABNORMAL HIGH (ref 0.0–0.4)
Eos: 7 %
Hematocrit: 38.2 % (ref 34.0–46.6)
Hemoglobin: 12.9 g/dL (ref 11.1–15.9)
Immature Grans (Abs): 0 10*3/uL (ref 0.0–0.1)
Immature Granulocytes: 0 %
Lymphocytes Absolute: 1.5 10*3/uL (ref 0.7–3.1)
Lymphs: 21 %
MCH: 29.1 pg (ref 26.6–33.0)
MCHC: 33.8 g/dL (ref 31.5–35.7)
MCV: 86 fL (ref 79–97)
Monocytes Absolute: 0.5 10*3/uL (ref 0.1–0.9)
Monocytes: 7 %
Neutrophils Absolute: 4.4 10*3/uL (ref 1.4–7.0)
Neutrophils: 64 %
Platelets: 294 10*3/uL (ref 150–450)
RBC: 4.43 x10E6/uL (ref 3.77–5.28)
RDW: 12.4 % (ref 11.7–15.4)
WBC: 6.9 10*3/uL (ref 3.4–10.8)

## 2020-10-13 LAB — LIPID PANEL
Chol/HDL Ratio: 2.5 ratio (ref 0.0–4.4)
Cholesterol, Total: 184 mg/dL (ref 100–199)
HDL: 73 mg/dL (ref >39)
LDL Chol Calc (NIH): 82 mg/dL (ref 0–99)
Triglycerides: 174 mg/dL — ABNORMAL HIGH (ref 0–149)
VLDL Cholesterol Cal: 29 mg/dL (ref 5–40)

## 2020-10-13 LAB — CMP14+EGFR
ALT: 12 IU/L (ref 0–32)
AST: 13 IU/L (ref 0–40)
Albumin/Globulin Ratio: 1.5 (ref 1.2–2.2)
Albumin: 4 g/dL (ref 3.8–4.8)
Alkaline Phosphatase: 110 IU/L (ref 44–121)
BUN/Creatinine Ratio: 14 (ref 12–28)
BUN: 10 mg/dL (ref 8–27)
Bilirubin Total: 0.4 mg/dL (ref 0.0–1.2)
CO2: 23 mmol/L (ref 20–29)
Calcium: 9.4 mg/dL (ref 8.7–10.3)
Chloride: 102 mmol/L (ref 96–106)
Creatinine, Ser: 0.72 mg/dL (ref 0.57–1.00)
Globulin, Total: 2.6 g/dL (ref 1.5–4.5)
Glucose: 176 mg/dL — ABNORMAL HIGH (ref 65–99)
Potassium: 4.4 mmol/L (ref 3.5–5.2)
Sodium: 141 mmol/L (ref 134–144)
Total Protein: 6.6 g/dL (ref 6.0–8.5)
eGFR: 93 mL/min/{1.73_m2} (ref >59)

## 2020-10-26 ENCOUNTER — Encounter: Payer: Self-pay | Admitting: Family Medicine

## 2020-10-26 DIAGNOSIS — Z1211 Encounter for screening for malignant neoplasm of colon: Secondary | ICD-10-CM

## 2020-11-15 ENCOUNTER — Encounter: Payer: Self-pay | Admitting: Family Medicine

## 2020-11-20 DIAGNOSIS — Z124 Encounter for screening for malignant neoplasm of cervix: Secondary | ICD-10-CM | POA: Diagnosis not present

## 2020-11-20 DIAGNOSIS — Z01411 Encounter for gynecological examination (general) (routine) with abnormal findings: Secondary | ICD-10-CM | POA: Diagnosis not present

## 2020-11-20 DIAGNOSIS — N9489 Other specified conditions associated with female genital organs and menstrual cycle: Secondary | ICD-10-CM | POA: Diagnosis not present

## 2020-11-20 DIAGNOSIS — N6320 Unspecified lump in the left breast, unspecified quadrant: Secondary | ICD-10-CM | POA: Diagnosis not present

## 2020-11-20 DIAGNOSIS — N61 Mastitis without abscess: Secondary | ICD-10-CM | POA: Diagnosis not present

## 2020-11-21 DIAGNOSIS — I4891 Unspecified atrial fibrillation: Secondary | ICD-10-CM | POA: Diagnosis not present

## 2020-11-21 DIAGNOSIS — I452 Bifascicular block: Secondary | ICD-10-CM | POA: Diagnosis not present

## 2020-11-21 DIAGNOSIS — I471 Supraventricular tachycardia: Secondary | ICD-10-CM | POA: Diagnosis not present

## 2020-11-21 DIAGNOSIS — E119 Type 2 diabetes mellitus without complications: Secondary | ICD-10-CM | POA: Diagnosis not present

## 2020-11-21 DIAGNOSIS — R0689 Other abnormalities of breathing: Secondary | ICD-10-CM | POA: Diagnosis not present

## 2020-11-21 DIAGNOSIS — J9811 Atelectasis: Secondary | ICD-10-CM | POA: Diagnosis not present

## 2020-11-21 DIAGNOSIS — R059 Cough, unspecified: Secondary | ICD-10-CM | POA: Diagnosis not present

## 2020-11-21 DIAGNOSIS — R6884 Jaw pain: Secondary | ICD-10-CM | POA: Diagnosis not present

## 2020-11-21 DIAGNOSIS — I10 Essential (primary) hypertension: Secondary | ICD-10-CM | POA: Diagnosis not present

## 2020-11-21 DIAGNOSIS — R0902 Hypoxemia: Secondary | ICD-10-CM | POA: Diagnosis not present

## 2020-11-21 DIAGNOSIS — R0989 Other specified symptoms and signs involving the circulatory and respiratory systems: Secondary | ICD-10-CM | POA: Diagnosis not present

## 2020-11-21 DIAGNOSIS — R000 Tachycardia, unspecified: Secondary | ICD-10-CM | POA: Diagnosis not present

## 2020-11-21 DIAGNOSIS — T18108A Unspecified foreign body in esophagus causing other injury, initial encounter: Secondary | ICD-10-CM | POA: Diagnosis not present

## 2020-11-21 DIAGNOSIS — R079 Chest pain, unspecified: Secondary | ICD-10-CM | POA: Diagnosis not present

## 2020-11-22 ENCOUNTER — Other Ambulatory Visit: Payer: Medicare HMO

## 2020-11-22 ENCOUNTER — Other Ambulatory Visit: Payer: Self-pay | Admitting: Obstetrics & Gynecology

## 2020-11-22 DIAGNOSIS — N6320 Unspecified lump in the left breast, unspecified quadrant: Secondary | ICD-10-CM

## 2020-11-24 ENCOUNTER — Encounter: Payer: Self-pay | Admitting: Family Medicine

## 2020-11-24 ENCOUNTER — Other Ambulatory Visit: Payer: Self-pay

## 2020-11-24 ENCOUNTER — Ambulatory Visit (INDEPENDENT_AMBULATORY_CARE_PROVIDER_SITE_OTHER): Payer: Medicare HMO | Admitting: Family Medicine

## 2020-11-24 VITALS — BP 125/69 | HR 94 | Temp 98.4°F | Ht 63.0 in | Wt 164.8 lb

## 2020-11-24 DIAGNOSIS — M542 Cervicalgia: Secondary | ICD-10-CM

## 2020-11-24 DIAGNOSIS — I459 Conduction disorder, unspecified: Secondary | ICD-10-CM

## 2020-11-24 DIAGNOSIS — R42 Dizziness and giddiness: Secondary | ICD-10-CM | POA: Diagnosis not present

## 2020-11-24 MED ORDER — PREDNISONE 5 MG/5ML PO SOLN
20.0000 mg | Freq: Every day | ORAL | 0 refills | Status: DC
Start: 2020-11-24 — End: 2020-12-08

## 2020-11-24 NOTE — Progress Notes (Signed)
Assessment & Plan:  1. Neck pain Neck exercises provided.  Encouraged use of NSAID.  Continue heating pad. - predniSONE 5 MG/5ML solution; Take 20 mLs (20 mg total) by mouth daily with breakfast.  Dispense: 100 mL; Refill: 0  2. Skipped heart beats - Ambulatory referral to Cardiology  3. Dizziness Orthostatic vital signs were normal.  Lab work was normal 3 days ago in the ER.  Advised to monitor her blood pressure during and around these episodes.   Follow up plan: Return as scheduled.  Hendricks Limes, MSN, APRN, FNP-C [facility]  Subjective:   Patient ID: Cassie Hunter, female    DOB: 12-19-1955, 66 y.o.   MRN: [id number]  HPI: Cassie Hunter is a 65 y.o. female presenting on 11/24/2020 for Neck Pain (Patient states that she stretched and something popped in the back on her neck x  49month ago)  Patient reports about 2 months ago she felt something pop on the left side of her neck when she was stretching.  States it did not hurt but "felt funny".  She has been applying a heating pad which is somewhat helpful.  Patient also reports she has had 3 episodes where she gets dizzy and things go black.  Episodes do not last long.  She has not actually passed out.  She also reports she felt like her heart was skipping beats last night.   ROS: Negative unless specifically indicated above in HPI.   Relevant past medical history reviewed and updated as indicated.   Allergies and medications reviewed and updated.   Current Outpatient Medications:  .  albuterol (VENTOLIN HFA) 108 (90 Base) MCG/ACT inhaler, Inhale 2 puffs into the lungs every 6 (six) hours as needed., Disp: 18 g, Rfl: 2 .  Alcohol Swabs (B-D SINGLE USE SWABS REGULAR) PADS, Test BS daily and as needed Dx E11.9, Disp: 100 each, Rfl: 3 .  Blood Glucose Calibration (TRUE METRIX LEVEL 1) Low SOLN, Use with glucose monitor Dx E11.9, Disp: 3 each, Rfl: 3 .  Blood Glucose Monitoring Suppl  (TRUE METRIX AIR GLUCOSE METER) w/Device KIT, Test BS daily and as needed Dx E11.9, Disp: 1 kit, Rfl: 0 .  glucose blood (TRUE METRIX BLOOD GLUCOSE TEST) test strip, Test BS daily and as needed Dx E11.9, Disp: 100 each, Rfl: 3 .  hydrochlorothiazide (HYDRODIURIL) 12.5 MG tablet, Take 1 tablet (12.5 mg total) by mouth daily., Disp: 30 tablet, Rfl: 2 .  losartan (COZAAR) 100 MG tablet, Take 1 tablet (100 mg total) by mouth daily., Disp: 90 tablet, Rfl: 1 .  metFORMIN (GLUCOPHAGE) 500 MG tablet, Take 2 tablets (1,000 mg total) by mouth daily with breakfast AND 1 tablet (500 mg total) daily with supper., Disp: 270 tablet, Rfl: 1 .  simvastatin (ZOCOR) 20 MG tablet, Take 1 tablet (20 mg total) by mouth daily at 6 PM., Disp: 90 tablet, Rfl: 1 .  TRUEplus Lancets 33G MISC, Test BS daily and as needed Dx E11.9, Disp: 100 each, Rfl: 3  Allergies  Allergen Reactions  . Codeine Nausea Only    Objective:   BP 125/69   Pulse 94   Temp 98.4 F (36.9 C) (Temporal)   Ht 5' 3"  (1.6 m)   Wt 164 lb 12.8 oz (74.8 kg)   SpO2 100%   BMI 29.19 kg/m    Physical Exam Vitals reviewed.  Constitutional:      General: She is not in acute distress.    Appearance: Normal appearance.  She is not ill-appearing, toxic-appearing or diaphoretic.  HENT:     Head: Normocephalic and atraumatic.     Right Ear: Tympanic membrane, ear canal and external ear normal. There is no impacted cerumen.     Left Ear: Tympanic membrane, ear canal and external ear normal. There is no impacted cerumen.  Eyes:     General: No scleral icterus.       Right eye: No discharge.        Left eye: No discharge.     Conjunctiva/sclera: Conjunctivae normal.  Cardiovascular:     Rate and Rhythm: Normal rate and regular rhythm.     Heart sounds: Normal heart sounds. No murmur heard. No friction rub. No gallop.   Pulmonary:     Effort: Pulmonary effort is normal. No respiratory distress.     Breath sounds: Normal breath sounds. No stridor.  No wheezing, rhonchi or rales.  Musculoskeletal:        General: Normal range of motion.     Cervical back: Normal range of motion. Muscular tenderness (left side below hairline) present.  Skin:    General: Skin is warm and dry.     Capillary Refill: Capillary refill takes less than 2 seconds.  Neurological:     General: No focal deficit present.     Mental Status: She is alert and oriented to person, place, and time. Mental status is at baseline.  Psychiatric:        Mood and Affect: Mood normal.        Behavior: Behavior normal.        Thought Content: Thought content normal.        Judgment: Judgment normal.

## 2020-11-24 NOTE — Patient Instructions (Signed)

## 2020-12-06 ENCOUNTER — Other Ambulatory Visit: Payer: Self-pay | Admitting: Physician Assistant

## 2020-12-06 DIAGNOSIS — Z1211 Encounter for screening for malignant neoplasm of colon: Secondary | ICD-10-CM | POA: Diagnosis not present

## 2020-12-06 DIAGNOSIS — K2100 Gastro-esophageal reflux disease with esophagitis, without bleeding: Secondary | ICD-10-CM

## 2020-12-06 DIAGNOSIS — R1310 Dysphagia, unspecified: Secondary | ICD-10-CM

## 2020-12-06 DIAGNOSIS — K219 Gastro-esophageal reflux disease without esophagitis: Secondary | ICD-10-CM | POA: Diagnosis not present

## 2020-12-06 DIAGNOSIS — Z8719 Personal history of other diseases of the digestive system: Secondary | ICD-10-CM | POA: Diagnosis not present

## 2020-12-07 ENCOUNTER — Encounter: Payer: Medicare HMO | Admitting: Family Medicine

## 2020-12-07 ENCOUNTER — Other Ambulatory Visit: Payer: Self-pay

## 2020-12-08 ENCOUNTER — Encounter: Payer: Self-pay | Admitting: Family Medicine

## 2020-12-08 ENCOUNTER — Ambulatory Visit (INDEPENDENT_AMBULATORY_CARE_PROVIDER_SITE_OTHER): Payer: Medicare HMO | Admitting: Family Medicine

## 2020-12-08 VITALS — BP 132/71 | HR 86 | Temp 98.1°F | Ht 63.0 in | Wt 165.2 lb

## 2020-12-08 DIAGNOSIS — Z0000 Encounter for general adult medical examination without abnormal findings: Secondary | ICD-10-CM

## 2020-12-08 DIAGNOSIS — I10 Essential (primary) hypertension: Secondary | ICD-10-CM

## 2020-12-08 NOTE — Patient Instructions (Signed)
  Ms. Cassie Hunter , Thank you for taking time to come for your Medicare Wellness Visit. I appreciate your ongoing commitment to your health goals. Please review the following plan we discussed and let me know if I can assist you in the future.   This is a list of the screening recommended for you and due dates:  Health Maintenance  Topic Date Due  . COVID-19 Vaccine (3 - Booster for Pfizer series) 12/24/2020*  . HIV Screening  10/12/2021*  . Zoster (Shingles) Vaccine (1 of 2) 12/08/2021*  . Flu Shot  02/12/2021  . Eye exam for diabetics  03/01/2021  . Hemoglobin A1C  04/13/2021  . Mammogram  08/24/2021  . Complete foot exam   10/12/2021  . Pneumonia vaccines (2 of 2 - PPSV23) 05/27/2022  . Cologuard (Stool DNA test)  05/01/2023  . Tetanus Vaccine  07/15/2025  . DEXA scan (bone density measurement)  Completed  . Hepatitis C Screening: USPSTF Recommendation to screen - Ages 71-79 yo.  Completed  . HPV Vaccine  Aged Out  *Topic was postponed. The date shown is not the original due date.

## 2020-12-08 NOTE — Progress Notes (Signed)
Subjective:    Tanisha Lutes is a 65 y.o. female who presents for a Welcome to Medicare exam.   Review of Systems Cardiac Risk Factors include: diabetes mellitus;hypertension      Objective:    Today's Vitals   12/08/20 0828  BP: 132/71  Pulse: 86  Temp: 98.1 F (36.7 C)  TempSrc: Temporal  SpO2: 100%  Weight: 165 lb 3.2 oz (74.9 kg)  Height: 5' 3"  (1.6 m)  Body mass index is 29.26 kg/m.  Medications Outpatient Encounter Medications as of 12/08/2020  Medication Sig  . albuterol (VENTOLIN HFA) 108 (90 Base) MCG/ACT inhaler Inhale 2 puffs into the lungs every 6 (six) hours as needed.  . Alcohol Swabs (B-D SINGLE USE SWABS REGULAR) PADS Test BS daily and as needed Dx E11.9  . Blood Glucose Calibration (TRUE METRIX LEVEL 1) Low SOLN Use with glucose monitor Dx E11.9  . Blood Glucose Monitoring Suppl (TRUE METRIX AIR GLUCOSE METER) w/Device KIT Test BS daily and as needed Dx E11.9  . glucose blood (TRUE METRIX BLOOD GLUCOSE TEST) test strip Test BS daily and as needed Dx E11.9  . hydrochlorothiazide (HYDRODIURIL) 12.5 MG tablet Take 1 tablet (12.5 mg total) by mouth daily.  Marland Kitchen losartan (COZAAR) 100 MG tablet Take 1 tablet (100 mg total) by mouth daily.  . metFORMIN (GLUCOPHAGE) 500 MG tablet Take 2 tablets (1,000 mg total) by mouth daily with breakfast AND 1 tablet (500 mg total) daily with supper.  . simvastatin (ZOCOR) 20 MG tablet Take 1 tablet (20 mg total) by mouth daily at 6 PM.  . TRUEplus Lancets 33G MISC Test BS daily and as needed Dx E11.9  . [DISCONTINUED] predniSONE 5 MG/5ML solution Take 20 mLs (20 mg total) by mouth daily with breakfast.   No facility-administered encounter medications on file as of 12/08/2020.     History: Past Medical History:  Diagnosis Date  . Diabetes mellitus   . Difficulty swallowing   . Diverticulitis   . Essential hypertension    Past Surgical History:  Procedure Laterality Date  . ABDOMINAL HYSTERECTOMY    .  APPENDECTOMY    . CHOLECYSTECTOMY    . SPINAL CORD DECOMPRESSION  1996    Family History  Problem Relation Age of Onset  . Pancreatic cancer Father   . Lung cancer Father   . CVA Mother   . Spina bifida Sister   . Heart attack Maternal Grandmother   . Heart attack Maternal Grandfather   . Stroke Paternal Grandmother   . Aneurysm Paternal Grandfather    Social History   Occupational History  . Not on file  Tobacco Use  . Smoking status: Never Smoker  . Smokeless tobacco: Never Used  Vaping Use  . Vaping Use: Never used  Substance and Sexual Activity  . Alcohol use: No  . Drug use: No  . Sexual activity: Not on file    Tobacco Counseling Counseling given: Not Answered  Immunizations and Health Maintenance Immunization History  Administered Date(s) Administered  . Fluad Quad(high Dose 65+) 06/15/2020  . Influenza,inj,Quad PF,6+ Mos 06/01/2014, 05/02/2017, 04/30/2018, 06/22/2019  . PFIZER(Purple Top)SARS-COV-2 Vaccination 03/22/2020, 04/12/2020  . Pneumococcal Conjugate-13 10/12/2020  . Pneumococcal Polysaccharide-23 05/27/2017   There are no preventive care reminders to display for this patient.  Activities of Daily Living In your present state of health, do you have any difficulty performing the following activities: 12/08/2020  Hearing? N  Vision? N  Difficulty concentrating or making decisions? N  Walking  or climbing stairs? N  Dressing or bathing? N  Doing errands, shopping? N  Preparing Food and eating ? N  Using the Toilet? N  In the past six months, have you accidently leaked urine? N  Do you have problems with loss of bowel control? N  Managing your Medications? N  Managing your Finances? N  Housekeeping or managing your Housekeeping? N  Some recent data might be hidden    Physical Exam: not completed  Advanced Directives: Does Patient Have a Medical Advance Directive?: No Would patient like information on creating a medical advance directive?: No  - Patient declined    Assessment:    This is a routine wellness examination for this patient.  Vision/Hearing screen No exam data present  Dietary issues and exercise activities discussed:  Current Exercise Habits: Home exercise routine, Type of exercise: walking, Time (Minutes): 60, Frequency (Times/Week): 7, Weekly Exercise (Minutes/Week): 420, Intensity: Moderate, Exercise limited by: None identified  Goals    .  Control Diabetes (pt-stated)      Control diabetes.       Depression Screen PHQ 2/9 Scores 12/08/2020 10/12/2020 04/13/2020 01/04/2020  PHQ - 2 Score 0 0 0 0  PHQ- 9 Score 4 - - -     Fall Risk Fall Risk  12/08/2020  Falls in the past year? 0    Cognitive Function: MMSE - Mini Mental State Exam 12/08/2020  Orientation to time 5  Orientation to Place 5  Registration 3  Attention/ Calculation 5  Recall 2  Language- name 2 objects 2  Language- repeat 1  Language- follow 3 step command 3  Language- read & follow direction 1  Write a sentence 1  Copy design 1  Total score 29        Patient Care Team: Loman Brooklyn, FNP as PCP - General (Family Medicine)     Plan:    I have personally reviewed and noted the following in the patient's chart:   . Medical and social history . Use of alcohol, tobacco or illicit drugs  . Current medications and supplements . Functional ability and status . Nutritional status . Physical activity . Advanced directives . List of other physicians . Hospitalizations, surgeries, and ER visits in previous 12 months . Vitals . Screenings to include cognitive, depression, and falls . Referrals and appointments  In addition, I have reviewed and discussed with patient certain preventive protocols, quality metrics, and best practice recommendations. A written personalized care plan for preventive services as well as general preventive health recommendations were provided to patient.  EKG: Sinus rhythm      Loman Brooklyn,  FNP 12/08/2020

## 2020-12-14 DIAGNOSIS — L03032 Cellulitis of left toe: Secondary | ICD-10-CM | POA: Diagnosis not present

## 2020-12-14 DIAGNOSIS — M7989 Other specified soft tissue disorders: Secondary | ICD-10-CM | POA: Diagnosis not present

## 2020-12-15 DIAGNOSIS — L03116 Cellulitis of left lower limb: Secondary | ICD-10-CM | POA: Diagnosis not present

## 2020-12-21 ENCOUNTER — Other Ambulatory Visit: Payer: Self-pay | Admitting: Obstetrics & Gynecology

## 2020-12-21 DIAGNOSIS — N644 Mastodynia: Secondary | ICD-10-CM | POA: Diagnosis not present

## 2020-12-21 DIAGNOSIS — N9489 Other specified conditions associated with female genital organs and menstrual cycle: Secondary | ICD-10-CM | POA: Diagnosis not present

## 2020-12-21 DIAGNOSIS — N898 Other specified noninflammatory disorders of vagina: Secondary | ICD-10-CM | POA: Diagnosis not present

## 2020-12-22 ENCOUNTER — Other Ambulatory Visit: Payer: Medicare HMO

## 2020-12-25 ENCOUNTER — Ambulatory Visit
Admission: RE | Admit: 2020-12-25 | Discharge: 2020-12-25 | Disposition: A | Discharge status: Home / Self Care | Payer: Medicare HMO | Source: Ambulatory Visit | Attending: Physician Assistant | Admitting: Physician Assistant

## 2020-12-25 ENCOUNTER — Other Ambulatory Visit: Payer: Self-pay | Admitting: Physician Assistant

## 2020-12-25 DIAGNOSIS — Z8719 Personal history of other diseases of the digestive system: Secondary | ICD-10-CM

## 2020-12-25 DIAGNOSIS — R1310 Dysphagia, unspecified: Secondary | ICD-10-CM

## 2020-12-25 DIAGNOSIS — K219 Gastro-esophageal reflux disease without esophagitis: Secondary | ICD-10-CM | POA: Diagnosis not present

## 2020-12-25 DIAGNOSIS — K2100 Gastro-esophageal reflux disease with esophagitis, without bleeding: Secondary | ICD-10-CM

## 2021-01-04 DIAGNOSIS — L918 Other hypertrophic disorders of the skin: Secondary | ICD-10-CM | POA: Diagnosis not present

## 2021-01-06 ENCOUNTER — Other Ambulatory Visit: Payer: Self-pay | Admitting: Family Medicine

## 2021-01-06 DIAGNOSIS — I10 Essential (primary) hypertension: Secondary | ICD-10-CM

## 2021-01-11 ENCOUNTER — Other Ambulatory Visit: Payer: Self-pay

## 2021-01-11 ENCOUNTER — Ambulatory Visit (INDEPENDENT_AMBULATORY_CARE_PROVIDER_SITE_OTHER): Payer: Medicare HMO | Admitting: Family Medicine

## 2021-01-11 ENCOUNTER — Encounter: Payer: Self-pay | Admitting: Family Medicine

## 2021-01-11 VITALS — BP 123/78 | HR 83 | Temp 97.8°F | Ht 63.0 in | Wt 161.0 lb

## 2021-01-11 DIAGNOSIS — I10 Essential (primary) hypertension: Secondary | ICD-10-CM

## 2021-01-11 DIAGNOSIS — I700 Atherosclerosis of aorta: Secondary | ICD-10-CM | POA: Diagnosis not present

## 2021-01-11 DIAGNOSIS — R1319 Other dysphagia: Secondary | ICD-10-CM

## 2021-01-11 DIAGNOSIS — E1165 Type 2 diabetes mellitus with hyperglycemia: Secondary | ICD-10-CM | POA: Diagnosis not present

## 2021-01-11 DIAGNOSIS — E119 Type 2 diabetes mellitus without complications: Secondary | ICD-10-CM | POA: Diagnosis not present

## 2021-01-11 LAB — BAYER DCA HB A1C WAIVED: HB A1C (BAYER DCA - WAIVED): 6.7 % (ref <7.0)

## 2021-01-11 NOTE — Progress Notes (Signed)
Assessment & Plan:  1. Type 2 diabetes mellitus without complication, without long-term current use of insulin (HCC) Lab Results  Component Value Date   HGBA1C 6.7 01/11/2021   HGBA1C 6.5 10/12/2020   HGBA1C 6.2 04/13/2020    - Diabetes is at goal of A1c < 7. - Medications: continue current medications - Home glucose monitoring: continue monitoring - Patient is currently taking a statin. Patient is taking an ACE-inhibitor/ARB.  - Instruction/counseling given: discussed diet  Diabetes Health Maintenance Due  Topic Date Due   OPHTHALMOLOGY EXAM  03/01/2021   HEMOGLOBIN A1C  04/13/2021   FOOT EXAM  10/12/2021    - Lipid panel - CBC with Differential/Platelet - CMP14+EGFR - Bayer DCA Hb A1c Waived  2. Essential hypertension, benign Well controlled on current regimen.  - Lipid panel - CBC with Differential/Platelet - CMP14+EGFR  3. Aortic atherosclerosis ([place]) On a statin. - Lipid panel - CBC with Differential/Platelet  4. Esophageal dysphagia Scheduled for endoscopy in September.  - omeprazole (PRILOSEC) 40 MG capsule; Take 40 mg by mouth daily.   Return in about 4 months (around 05/13/2021) for annual physical.  Hendricks Limes, MSN, APRN, FNP-C [facility]  Subjective:    Patient ID: Cassie Hunter, female    DOB: 24-Jul-1955, 65 y.o.   MRN: [id number]  Patient Care Team: Loman Brooklyn, FNP as PCP - General (Family Medicine)   Chief Complaint:  Chief Complaint  Patient presents with   Diabetes    Check up of chronic medical conditions     HPI: Cassie Hunter is a 65 y.o. female presenting on 01/11/2021 for Diabetes (Check up of chronic medical conditions )  Diabetes: Patient presents for follow up of diabetes. Current symptoms include: hyperglycemia. Known diabetic complications: none. Medication compliance: yes. Current diet:  sometimes healthy, sometimes not . Current exercise: none. Home blood sugar records:   fasting in the 120s . Is she  on ACE inhibitor or angiotensin II receptor blocker? Yes. Is she on a statin? Yes.   Hypertension: patient does not check her blood pressure at home. She does not add salt to her food.  New complaints: None   Social history:  Relevant past medical, surgical, family and social history reviewed and updated as indicated. Interim medical history since our last visit reviewed.  Allergies and medications reviewed and updated.  DATA REVIEWED: CHART IN EPIC  ROS: Negative unless specifically indicated above in HPI.    Current Outpatient Medications:    albuterol (VENTOLIN HFA) 108 (90 Base) MCG/ACT inhaler, Inhale 2 puffs into the lungs every 6 (six) hours as needed., Disp: 18 g, Rfl: 2   Alcohol Swabs (B-D SINGLE USE SWABS REGULAR) PADS, Test BS daily and as needed Dx E11.9, Disp: 100 each, Rfl: 3   Blood Glucose Calibration (TRUE METRIX LEVEL 1) Low SOLN, Use with glucose monitor Dx E11.9, Disp: 3 each, Rfl: 3   Blood Glucose Monitoring Suppl (TRUE METRIX AIR GLUCOSE METER) w/Device KIT, Test BS daily and as needed Dx E11.9, Disp: 1 kit, Rfl: 0   glucose blood (TRUE METRIX BLOOD GLUCOSE TEST) test strip, Test BS daily and as needed Dx E11.9, Disp: 100 each, Rfl: 3   hydrochlorothiazide (HYDRODIURIL) 12.5 MG tablet, Take 1 tablet by mouth once daily, Disp: 90 tablet, Rfl: 0   losartan (COZAAR) 100 MG tablet, Take 1 tablet (100 mg total) by mouth daily., Disp: 90 tablet, Rfl: 1   metFORMIN (GLUCOPHAGE) 500 MG tablet, Take 2 tablets (  1,000 mg total) by mouth daily with breakfast AND 1 tablet (500 mg total) daily with supper., Disp: 270 tablet, Rfl: 1   omeprazole (PRILOSEC) 40 MG capsule, Take 40 mg by mouth daily., Disp: , Rfl:    simvastatin (ZOCOR) 20 MG tablet, Take 1 tablet (20 mg total) by mouth daily at 6 PM., Disp: 90 tablet, Rfl: 1   TRUEplus Lancets 33G MISC, Test BS daily and as needed Dx E11.9, Disp: 100 each, Rfl: 3   Allergies  Allergen Reactions    Codeine Nausea Only   Neosporin [Bacitracin-Polymyxin B] Rash   Past Medical History:  Diagnosis Date   Diabetes mellitus    Difficulty swallowing    Diverticulitis    Essential hypertension     Past Surgical History:  Procedure Laterality Date   ABDOMINAL HYSTERECTOMY     APPENDECTOMY     CHOLECYSTECTOMY     SPINAL CORD DECOMPRESSION  1996    Social History   Socioeconomic History   Marital status: Married    Spouse name: Not on file   Number of children: Not on file   Years of education: Not on file   Highest education level: Not on file  Occupational History   Not on file  Tobacco Use   Smoking status: Never   Smokeless tobacco: Never  Vaping Use   Vaping Use: Never used  Substance and Sexual Activity   Alcohol use: No   Drug use: No   Sexual activity: Not on file  Other Topics Concern   Not on file  Social History Narrative   Not on file   Social Determinants of Health   Financial Resource Strain: Not on file  Food Insecurity: Not on file  Transportation Needs: Not on file  Physical Activity: Not on file  Stress: Not on file  Social Connections: Not on file  Intimate Partner Violence: Not on file        Objective:    BP 123/78   Pulse 83   Temp 97.8 F (36.6 C) (Temporal)   Ht 5' 3"  (1.6 m)   Wt 161 lb (73 kg)   SpO2 96%   BMI 28.52 kg/m   Wt Readings from Last 3 Encounters:  01/11/21 161 lb (73 kg)  12/08/20 165 lb 3.2 oz (74.9 kg)  11/24/20 164 lb 12.8 oz (74.8 kg)    Physical Exam Vitals reviewed.  Constitutional:      General: She is not in acute distress.    Appearance: Normal appearance. She is overweight. She is not ill-appearing, toxic-appearing or diaphoretic.  HENT:     Head: Normocephalic and atraumatic.  Eyes:     General: No scleral icterus.       Right eye: No discharge.        Left eye: No discharge.     Conjunctiva/sclera: Conjunctivae normal.  Cardiovascular:     Rate and Rhythm: Normal rate and regular rhythm.      Heart sounds: Normal heart sounds. No murmur heard.   No friction rub. No gallop.  Pulmonary:     Effort: Pulmonary effort is normal. No respiratory distress.     Breath sounds: Normal breath sounds. No stridor. No wheezing, rhonchi or rales.  Musculoskeletal:        General: Normal range of motion.     Cervical back: Normal range of motion.  Skin:    General: Skin is warm and dry.     Capillary Refill: Capillary refill takes less than 2  seconds.  Neurological:     General: No focal deficit present.     Mental Status: She is alert and oriented to person, place, and time. Mental status is at baseline.  Psychiatric:        Mood and Affect: Mood normal.        Behavior: Behavior normal.        Thought Content: Thought content normal.        Judgment: Judgment normal.   Diabetic Foot Exam - Simple   No data filed     No results found for: TSH Lab Results  Component Value Date   WBC 6.9 10/12/2020   HGB 12.9 10/12/2020   HCT 38.2 10/12/2020   MCV 86 10/12/2020   PLT 294 10/12/2020   Lab Results  Component Value Date   NA 141 10/12/2020   K 4.4 10/12/2020   CO2 23 10/12/2020   GLUCOSE 176 (H) 10/12/2020   BUN 10 10/12/2020   CREATININE 0.72 10/12/2020   BILITOT 0.4 10/12/2020   ALKPHOS 110 10/12/2020   AST 13 10/12/2020   ALT 12 10/12/2020   PROT 6.6 10/12/2020   ALBUMIN 4.0 10/12/2020   CALCIUM 9.4 10/12/2020   ANIONGAP 7 11/06/2015   EGFR 93 10/12/2020   Lab Results  Component Value Date   CHOL 184 10/12/2020   Lab Results  Component Value Date   HDL 73 10/12/2020   Lab Results  Component Value Date   LDLCALC 82 10/12/2020   Lab Results  Component Value Date   TRIG 174 (H) 10/12/2020   Lab Results  Component Value Date   CHOLHDL 2.5 10/12/2020   Lab Results  Component Value Date   HGBA1C 6.5 10/12/2020

## 2021-01-12 LAB — CBC WITH DIFFERENTIAL/PLATELET
Basophils Absolute: 0 10*3/uL (ref 0.0–0.2)
Basos: 1 %
EOS (ABSOLUTE): 0.4 10*3/uL (ref 0.0–0.4)
Eos: 7 %
Hematocrit: 34.8 % (ref 34.0–46.6)
Hemoglobin: 11.5 g/dL (ref 11.1–15.9)
Immature Grans (Abs): 0 10*3/uL (ref 0.0–0.1)
Immature Granulocytes: 0 %
Lymphocytes Absolute: 1.3 10*3/uL (ref 0.7–3.1)
Lymphs: 23 %
MCH: 29.4 pg (ref 26.6–33.0)
MCHC: 33 g/dL (ref 31.5–35.7)
MCV: 89 fL (ref 79–97)
Monocytes Absolute: 0.4 10*3/uL (ref 0.1–0.9)
Monocytes: 7 %
Neutrophils Absolute: 3.6 10*3/uL (ref 1.4–7.0)
Neutrophils: 62 %
Platelets: 290 10*3/uL (ref 150–450)
RBC: 3.91 x10E6/uL (ref 3.77–5.28)
RDW: 12.7 % (ref 11.7–15.4)
WBC: 5.8 10*3/uL (ref 3.4–10.8)

## 2021-01-12 LAB — CMP14+EGFR
ALT: 14 IU/L (ref 0–32)
AST: 14 IU/L (ref 0–40)
Albumin/Globulin Ratio: 1.6 (ref 1.2–2.2)
Albumin: 3.9 g/dL (ref 3.8–4.8)
Alkaline Phosphatase: 102 IU/L (ref 44–121)
BUN/Creatinine Ratio: 10 — ABNORMAL LOW (ref 12–28)
BUN: 10 mg/dL (ref 8–27)
Bilirubin Total: 0.5 mg/dL (ref 0.0–1.2)
CO2: 25 mmol/L (ref 20–29)
Calcium: 9.2 mg/dL (ref 8.7–10.3)
Chloride: 98 mmol/L (ref 96–106)
Creatinine, Ser: 1.05 mg/dL — ABNORMAL HIGH (ref 0.57–1.00)
Globulin, Total: 2.4 g/dL (ref 1.5–4.5)
Glucose: 214 mg/dL — ABNORMAL HIGH (ref 65–99)
Potassium: 4.6 mmol/L (ref 3.5–5.2)
Sodium: 139 mmol/L (ref 134–144)
Total Protein: 6.3 g/dL (ref 6.0–8.5)
eGFR: 59 mL/min/{1.73_m2} — ABNORMAL LOW (ref >59)

## 2021-01-12 LAB — LIPID PANEL
Chol/HDL Ratio: 2.1 ratio (ref 0.0–4.4)
Cholesterol, Total: 183 mg/dL (ref 100–199)
HDL: 86 mg/dL (ref >39)
LDL Chol Calc (NIH): 80 mg/dL (ref 0–99)
Triglycerides: 96 mg/dL (ref 0–149)
VLDL Cholesterol Cal: 17 mg/dL (ref 5–40)

## 2021-02-21 LAB — HM DIABETES EYE EXAM

## 2021-02-28 ENCOUNTER — Other Ambulatory Visit: Payer: Medicare HMO

## 2021-02-28 ENCOUNTER — Encounter: Payer: Self-pay | Admitting: Family Medicine

## 2021-03-07 ENCOUNTER — Other Ambulatory Visit: Payer: Self-pay | Admitting: Family Medicine

## 2021-03-07 DIAGNOSIS — I10 Essential (primary) hypertension: Secondary | ICD-10-CM

## 2021-03-08 ENCOUNTER — Other Ambulatory Visit: Payer: Self-pay

## 2021-03-08 ENCOUNTER — Ambulatory Visit: Payer: Medicare HMO | Admitting: Cardiovascular Disease

## 2021-03-08 ENCOUNTER — Encounter: Payer: Self-pay | Admitting: Cardiovascular Disease

## 2021-03-08 VITALS — BP 136/70 | HR 92 | Ht 63.0 in | Wt 161.8 lb

## 2021-03-08 DIAGNOSIS — I471 Supraventricular tachycardia: Secondary | ICD-10-CM | POA: Diagnosis not present

## 2021-03-08 MED ORDER — DILTIAZEM HCL 30 MG PO TABS
ORAL_TABLET | ORAL | 1 refills | Status: DC
Start: 2021-03-08 — End: 2021-08-06

## 2021-03-08 NOTE — Patient Instructions (Addendum)
Medication Instructions:  Your physician recommends that you continue on your current medications as directed, but we have sent in Cardizem 30 mg taking only as needed for SVT's  *If you need a refill on your cardiac medications before your next appointment, please call your pharmacy*   Lab Work: None ordered  If you have labs (blood work) drawn today and your tests are completely normal, you will receive your results only by: MyChart Message (if you have MyChart) OR A paper copy in the mail If you have any lab test that is abnormal or we need to change your treatment, we will call you to review the results.   Testing/Procedures: Your physician has requested that you have an echocardiogram. Echocardiography is a painless test that uses sound waves to create images of your heart. It provides your doctor with information about the size and shape of your heart and how well your heart's chambers and valves are working. This procedure takes approximately one hour. There are no restrictions for this procedure.  Follow-Up: At [facility], you and your health needs are our priority.  As part of our continuing mission to provide you with exceptional heart care, we have created designated Provider Care Teams.  These Care Teams include your primary Cardiologist (physician) and Advanced Practice Providers (APPs -  Physician Assistants and Nurse Practitioners) who all work together to provide you with the care you need, when you need it.  We recommend signing up for the patient portal called "MyChart".  Sign up information is provided on this After Visit Summary.  MyChart is used to connect with patients for Virtual Visits (Telemedicine).  Patients are able to view lab/test results, encounter notes, upcoming appointments, etc.  Non-urgent messages can be sent to your provider as well.   To learn more about what you can do with MyChart, go to [email or web address].    Your next appointment:   6  months  The format for your next appointment:   In Person  Provider:   You may see Verne Carrow, MD or one of the following Advanced Practice Providers on your designated Care Team:   Ronie Spies, PA-C Jacolyn Reedy, PA-C   Other Instructions

## 2021-03-08 NOTE — Progress Notes (Signed)
Chief Complaint  Patient presents with   New Patient (Initial Visit)    SVT   History of Present Illness: 65 yo female with history of SVT, DM, hyperlipidemia, esophageal stricture and HTN who is here today as a new patient for the evaluation of prior episode of palpitations, SVT. She has a history of esophageal stricture. She had a choking episode on May 2022. After this, she felt her heart racing and chest pain. EMS told her that her heart was racing and that she had SVT. She tried vagal maneuvers and then had IV adenosine and IV cardizem administered per EMS and converted to sinus. No prior known cardiac issues. EKG from primary care visit May 2022 shows sinus. No palpitations since May 2022 following the choking episode.   Primary Care Physician: Loman Brooklyn, FNP   Past Medical History:  Diagnosis Date   Diabetes mellitus    Difficulty swallowing    Diverticulitis    Essential hypertension     Past Surgical History:  Procedure Laterality Date   ABDOMINAL HYSTERECTOMY     APPENDECTOMY     CHOLECYSTECTOMY     SPINAL CORD DECOMPRESSION  1996    Current Outpatient Medications  Medication Sig Dispense Refill   albuterol (VENTOLIN HFA) 108 (90 Base) MCG/ACT inhaler Inhale 2 puffs into the lungs every 6 (six) hours as needed. 18 g 2   Alcohol Swabs (B-D SINGLE USE SWABS REGULAR) PADS Test BS daily and as needed Dx E11.9 100 each 3   Blood Glucose Calibration (TRUE METRIX LEVEL 1) Low SOLN Use with glucose monitor Dx E11.9 3 each 3   Blood Glucose Monitoring Suppl (TRUE METRIX AIR GLUCOSE METER) w/Device KIT Test BS daily and as needed Dx E11.9 1 kit 0   glucose blood (TRUE METRIX BLOOD GLUCOSE TEST) test strip Test BS daily and as needed Dx E11.9 100 each 3   hydrochlorothiazide (HYDRODIURIL) 12.5 MG tablet Take 1 tablet by mouth once daily 90 tablet 0   losartan (COZAAR) 100 MG tablet Take 1 tablet (100 mg total) by mouth daily. 90 tablet 1   metFORMIN (GLUCOPHAGE) 500 MG  tablet Take 2 tablets (1,000 mg total) by mouth daily with breakfast AND 1 tablet (500 mg total) daily with supper. 270 tablet 1   omeprazole (PRILOSEC) 40 MG capsule Take 40 mg by mouth daily.     simvastatin (ZOCOR) 20 MG tablet Take 1 tablet (20 mg total) by mouth daily at 6 PM. 90 tablet 1   TRUEplus Lancets 33G MISC Test BS daily and as needed Dx E11.9 100 each 3   No current facility-administered medications for this visit.    Allergies  Allergen Reactions   Codeine Nausea Only   Neosporin [Bacitracin-Polymyxin B] Rash    Social History   Socioeconomic History   Marital status: Married    Spouse name: Not on file   Number of children: 2   Years of education: Not on file   Highest education level: Not on file  Occupational History   Occupation: Retired Consulting civil engineer  Tobacco Use   Smoking status: Never   Smokeless tobacco: Never  Vaping Use   Vaping Use: Never used  Substance and Sexual Activity   Alcohol use: No   Drug use: No   Sexual activity: Not on file  Other Topics Concern   Not on file  Social History Narrative   Not on file   Social Determinants of Health   Financial Resource Strain: Not  on file  Food Insecurity: Not on file  Transportation Needs: Not on file  Physical Activity: Not on file  Stress: Not on file  Social Connections: Not on file  Intimate Partner Violence: Not on file    Family History  Problem Relation Age of Onset   Pancreatic cancer Father    Lung cancer Father    CVA Mother    Spina bifida Sister    Heart attack Maternal Grandmother    Heart attack Maternal Grandfather    Stroke Paternal Grandmother    Aneurysm Paternal Grandfather     Review of Systems:  As stated in the HPI and otherwise negative.   BP 136/70   Pulse 92   Ht 5' 3"  (1.6 m)   Wt 161 lb 12.8 oz (73.4 kg)   SpO2 99%   BMI 28.66 kg/m   Physical Examination: General: Well developed, well nourished, NAD  HEENT: OP clear, mucus membranes moist   SKIN: warm, dry. No rashes. Neuro: No focal deficits  Musculoskeletal: Muscle strength 5/5 all ext  Psychiatric: Mood and affect normal  Neck: No JVD, no carotid bruits, no thyromegaly, no lymphadenopathy.  Lungs:Clear bilaterally, no wheezes, rhonci, crackles Cardiovascular: Regular rate and rhythm. No murmurs, gallops or rubs. Abdomen:Soft. Bowel sounds present. Non-tender.  Extremities: No lower extremity edema. Pulses are 2 + in the bilateral DP/PT.  EKG:  EKG is not ordered today. The ekg from May 2022 is personally reviewed and shows sinus  Recent Labs: 01/11/2021: ALT 14; BUN 10; Creatinine, Ser 1.05; Hemoglobin 11.5; Platelets 290; Potassium 4.6; Sodium 139   Lipid Panel    Component Value Date/Time   CHOL 183 01/11/2021 0858   TRIG 96 01/11/2021 0858   HDL 86 01/11/2021 0858   CHOLHDL 2.1 01/11/2021 0858   LDLCALC 80 01/11/2021 0858     Wt Readings from Last 3 Encounters:  03/08/21 161 lb 12.8 oz (73.4 kg)  01/11/21 161 lb (73 kg)  12/08/20 165 lb 3.2 oz (74.9 kg)      Assessment and Plan:   1. SVT: One episode of probable SVT ( I cannot see EMS strips) following choking episode in May 2022. No recurrence. I have reviewed the etiology and symptoms with SVT and potential therapy including ablation should she have multiple recurrences. Diltiazem 30 mg as needed for SVT. Vagal maneuvers reviewed. Echo to assess LVEF and exclude structural heart disease.   Current medicines are reviewed at length with the patient today.  The patient does not have concerns regarding medicines.  The following changes have been made:  no change  Labs/ tests ordered today include:  No orders of the defined types were placed in this encounter.    Disposition:   F/U me in 6 months.    Signed, Lauree Chandler, MD 03/08/2021 11:08 AM    [facility] [place], [place],   [address] Phone: [phone]; Fax: [phone]

## 2021-03-12 ENCOUNTER — Other Ambulatory Visit: Payer: Self-pay | Admitting: Family Medicine

## 2021-03-12 DIAGNOSIS — E119 Type 2 diabetes mellitus without complications: Secondary | ICD-10-CM

## 2021-03-12 DIAGNOSIS — I10 Essential (primary) hypertension: Secondary | ICD-10-CM

## 2021-04-03 ENCOUNTER — Other Ambulatory Visit: Payer: Self-pay

## 2021-04-03 ENCOUNTER — Ambulatory Visit (HOSPITAL_COMMUNITY): Payer: Medicare HMO | Attending: Cardiovascular Disease

## 2021-04-03 DIAGNOSIS — I471 Supraventricular tachycardia: Secondary | ICD-10-CM | POA: Insufficient documentation

## 2021-04-03 LAB — ECHOCARDIOGRAM COMPLETE
Area-P 1/2: 3.46 cm2
S' Lateral: 2.4 cm

## 2021-04-10 DIAGNOSIS — E119 Type 2 diabetes mellitus without complications: Secondary | ICD-10-CM | POA: Diagnosis not present

## 2021-04-10 DIAGNOSIS — H5200 Hypermetropia, unspecified eye: Secondary | ICD-10-CM | POA: Diagnosis not present

## 2021-04-10 DIAGNOSIS — E7800 Pure hypercholesterolemia, unspecified: Secondary | ICD-10-CM | POA: Diagnosis not present

## 2021-04-10 DIAGNOSIS — Z0100 Encounter for examination of eyes and vision without abnormal findings: Secondary | ICD-10-CM | POA: Diagnosis not present

## 2021-04-18 ENCOUNTER — Encounter: Payer: Self-pay | Admitting: Family Medicine

## 2021-05-10 ENCOUNTER — Other Ambulatory Visit: Payer: Self-pay

## 2021-05-10 ENCOUNTER — Ambulatory Visit (INDEPENDENT_AMBULATORY_CARE_PROVIDER_SITE_OTHER): Payer: Medicare HMO

## 2021-05-10 DIAGNOSIS — Z23 Encounter for immunization: Secondary | ICD-10-CM | POA: Diagnosis not present

## 2021-05-15 ENCOUNTER — Ambulatory Visit (INDEPENDENT_AMBULATORY_CARE_PROVIDER_SITE_OTHER): Payer: Medicare HMO

## 2021-05-15 ENCOUNTER — Encounter: Payer: Self-pay | Admitting: Family Medicine

## 2021-05-15 ENCOUNTER — Other Ambulatory Visit: Payer: Self-pay

## 2021-05-15 ENCOUNTER — Ambulatory Visit (INDEPENDENT_AMBULATORY_CARE_PROVIDER_SITE_OTHER): Payer: Medicare HMO | Admitting: Family Medicine

## 2021-05-15 VITALS — BP 128/78 | HR 82 | Temp 97.8°F | Ht 63.0 in | Wt 158.4 lb

## 2021-05-15 DIAGNOSIS — K219 Gastro-esophageal reflux disease without esophagitis: Secondary | ICD-10-CM

## 2021-05-15 DIAGNOSIS — R1319 Other dysphagia: Secondary | ICD-10-CM | POA: Diagnosis not present

## 2021-05-15 DIAGNOSIS — I700 Atherosclerosis of aorta: Secondary | ICD-10-CM

## 2021-05-15 DIAGNOSIS — M7732 Calcaneal spur, left foot: Secondary | ICD-10-CM | POA: Diagnosis not present

## 2021-05-15 DIAGNOSIS — M79672 Pain in left foot: Secondary | ICD-10-CM | POA: Diagnosis not present

## 2021-05-15 DIAGNOSIS — I10 Essential (primary) hypertension: Secondary | ICD-10-CM | POA: Diagnosis not present

## 2021-05-15 DIAGNOSIS — E1165 Type 2 diabetes mellitus with hyperglycemia: Secondary | ICD-10-CM | POA: Diagnosis not present

## 2021-05-15 DIAGNOSIS — N6322 Unspecified lump in the left breast, upper inner quadrant: Secondary | ICD-10-CM | POA: Diagnosis not present

## 2021-05-15 LAB — CMP14+EGFR
ALT: 10 IU/L (ref 0–32)
AST: 17 IU/L (ref 0–40)
Albumin/Globulin Ratio: 2.1 (ref 1.2–2.2)
Albumin: 4.2 g/dL (ref 3.8–4.8)
Alkaline Phosphatase: 103 IU/L (ref 44–121)
BUN/Creatinine Ratio: 12 (ref 12–28)
BUN: 11 mg/dL (ref 8–27)
Bilirubin Total: 0.5 mg/dL (ref 0.0–1.2)
CO2: 27 mmol/L (ref 20–29)
Calcium: 9.5 mg/dL (ref 8.7–10.3)
Chloride: 101 mmol/L (ref 96–106)
Creatinine, Ser: 0.94 mg/dL (ref 0.57–1.00)
Globulin, Total: 2 g/dL (ref 1.5–4.5)
Glucose: 187 mg/dL — ABNORMAL HIGH (ref 70–99)
Potassium: 4.1 mmol/L (ref 3.5–5.2)
Sodium: 142 mmol/L (ref 134–144)
Total Protein: 6.2 g/dL (ref 6.0–8.5)
eGFR: 67 mL/min/{1.73_m2} (ref >59)

## 2021-05-15 LAB — CBC WITH DIFFERENTIAL/PLATELET
Basophils Absolute: 0.1 10*3/uL (ref 0.0–0.2)
Basos: 1 %
EOS (ABSOLUTE): 0.3 10*3/uL (ref 0.0–0.4)
Eos: 6 %
Hematocrit: 33.6 % — ABNORMAL LOW (ref 34.0–46.6)
Hemoglobin: 11.3 g/dL (ref 11.1–15.9)
Immature Grans (Abs): 0 10*3/uL (ref 0.0–0.1)
Immature Granulocytes: 0 %
Lymphocytes Absolute: 1.1 10*3/uL (ref 0.7–3.1)
Lymphs: 21 %
MCH: 29 pg (ref 26.6–33.0)
MCHC: 33.6 g/dL (ref 31.5–35.7)
MCV: 86 fL (ref 79–97)
Monocytes Absolute: 0.4 10*3/uL (ref 0.1–0.9)
Monocytes: 7 %
Neutrophils Absolute: 3.4 10*3/uL (ref 1.4–7.0)
Neutrophils: 65 %
Platelets: 284 10*3/uL (ref 150–450)
RBC: 3.89 x10E6/uL (ref 3.77–5.28)
RDW: 12 % (ref 11.7–15.4)
WBC: 5.2 10*3/uL (ref 3.4–10.8)

## 2021-05-15 LAB — LIPID PANEL
Chol/HDL Ratio: 2.4 ratio (ref 0.0–4.4)
Cholesterol, Total: 181 mg/dL (ref 100–199)
HDL: 74 mg/dL (ref >39)
LDL Chol Calc (NIH): 92 mg/dL (ref 0–99)
Triglycerides: 81 mg/dL (ref 0–149)
VLDL Cholesterol Cal: 15 mg/dL (ref 5–40)

## 2021-05-15 LAB — BAYER DCA HB A1C WAIVED: HB A1C (BAYER DCA - WAIVED): 6.4 % — ABNORMAL HIGH (ref 4.8–5.6)

## 2021-05-15 MED ORDER — METFORMIN HCL 500 MG PO TABS: ORAL_TABLET | ORAL | 1 refills | Status: DC

## 2021-05-15 MED ORDER — SIMVASTATIN 20 MG PO TABS: 20.0000 mg | ORAL_TABLET | Freq: Every day | ORAL | 1 refills | Status: DC

## 2021-05-15 MED ORDER — HYDROCHLOROTHIAZIDE 12.5 MG PO TABS: 12.5000 mg | ORAL_TABLET | Freq: Every day | ORAL | 1 refills | Status: DC

## 2021-05-15 MED ORDER — ASPIRIN 81 MG PO TBEC: 81.0000 mg | DELAYED_RELEASE_TABLET | Freq: Every day | ORAL | 12 refills | Status: AC

## 2021-05-15 NOTE — Patient Instructions (Signed)
Call to get your mammogram scheduled.

## 2021-05-15 NOTE — Progress Notes (Signed)
Assessment & Plan:  1. Type 2 diabetes mellitus with hyperglycemia, without long-term current use of insulin (HCC) Lab Results  Component Value Date   HGBA1C 6.7 01/11/2021   HGBA1C 6.5 10/12/2020   HGBA1C 6.2 04/13/2020   A1c 6.4 today.  - Diabetes is at goal of A1c < 7. - Medications: continue current medications - Home glucose monitoring: continue monitoring - Patient is currently taking a statin. Patient is taking an ACE-inhibitor/ARB.  - Instruction/counseling given: discussed foot care  Diabetes Health Maintenance Due  Topic Date Due   OPHTHALMOLOGY EXAM  03/01/2021   HEMOGLOBIN A1C  07/13/2021   FOOT EXAM  10/12/2021    Lab Results  Component Value Date   LABMICR 19.7 06/22/2019   - Bayer DCA Hb A1c Waived - simvastatin (ZOCOR) 20 MG tablet; Take 1 tablet (20 mg total) by mouth daily at 6 PM.  Dispense: 90 tablet; Refill: 1 - metFORMIN (GLUCOPHAGE) 500 MG tablet; Take 2 tablets (1,000 mg total) by mouth daily with breakfast AND 1 tablet (500 mg total) daily with supper.  Dispense: 270 tablet; Refill: 1 - Microalbumin / creatinine urine ratio  2. Essential hypertension, benign Well controlled on current regimen.  - Lipid panel - CBC with Differential/Platelet - CMP14+EGFR - hydrochlorothiazide (HYDRODIURIL) 12.5 MG tablet; Take 1 tablet (12.5 mg total) by mouth daily.  Dispense: 90 tablet; Refill: 1  3. Aortic atherosclerosis (HCC) Continue statin. Added ASA 81 mg daily. - simvastatin (ZOCOR) 20 MG tablet; Take 1 tablet (20 mg total) by mouth daily at 6 PM.  Dispense: 90 tablet; Refill: 1 - aspirin 81 MG EC tablet; Take 1 tablet (81 mg total) by mouth daily. Swallow whole.  Dispense: 30 tablet; Refill: 12  4-5. Gastroesophageal reflux disease, unspecified whether esophagitis present/Esophageal dysphagia Continue Omeprazole and following with gastroenterology. Encouraged to schedule EGD.  6. Mass of upper inner quadrant of left breast Patient to call and get  mammogram scheduled.  7. Acute foot pain, left - DG Foot Complete Left   Return in about 4 months (around 09/12/2021) for annual physical.  Hendricks Limes, MSN, APRN, FNP-C [facility]  Subjective:    Patient ID: Cassie Hunter, female    DOB: 12-16-1955, 65 y.o.   MRN: [id number]  Patient Care Team: Loman Brooklyn, FNP as PCP - General (Family Medicine) Celestia Khat, [place] (Optometry)   Chief Complaint:  Chief Complaint  Patient presents with   Diabetes    4 month follow up of chronic medical conditions    sore on foot    Left foot x 1 month and half. Patient states it was a blister from a shoe and now it a painful knot.     HPI: Cassie Hunter is a 65 y.o. female presenting on 05/15/2021 for Diabetes (4 month follow up of chronic medical conditions ) and sore on foot (Left foot x 1 month and half. Patient states it was a blister from a shoe and now it a painful knot. )  Diabetes: Patient presents for follow up of diabetes. Current symptoms include: hyperglycemia. Known diabetic complications: none. Medication compliance: yes. Current diet:  sometimes healthy, sometimes not . Current exercise: none. Home blood sugar records:  fasting in the 130-150s and up to 160 if she doesn't eat good . Is she  on ACE inhibitor or angiotensin II receptor blocker? Yes (Losartan). Is she on a statin? Yes (Simvastatin).   Hypertension: patient does not check her blood pressure at home. She  does not add salt to her food.  Difficulty Swallowing/GERD: patient is suppose to be having an EGD. She needed to be cleared by cardiology and has now been cleared. She reports now she has to get this scheduled on a day her son is available to drive her. She is taking omeprazole 40 mg daily.   Breast mass: patient is due for a follow-up ultrasound of her left breast.   New complaints: Patient reports pain in her left foot. This started when she was at the beach in the middle  of September and wore some shoes that rubbed a blister on the inner aspect. The pain shoots down to the bottom of her foot. She reports she has seen [facility] in the past and was told she would eventually need surgery on her foot due to arthritic changes and calcium deposits. She wanted to get her EGD completed first.    Social history:  Relevant past medical, surgical, family and social history reviewed and updated as indicated. Interim medical history since our last visit reviewed.  Allergies and medications reviewed and updated.  DATA REVIEWED: CHART IN EPIC  ROS: Negative unless specifically indicated above in HPI.    Current Outpatient Medications:    albuterol (VENTOLIN HFA) 108 (90 Base) MCG/ACT inhaler, Inhale 2 puffs into the lungs every 6 (six) hours as needed., Disp: 18 g, Rfl: 2   Alcohol Swabs (B-D SINGLE USE SWABS REGULAR) PADS, Test BS daily and as needed Dx E11.9, Disp: 100 each, Rfl: 3   Blood Glucose Calibration (TRUE METRIX LEVEL 1) Low SOLN, Use with glucose monitor Dx E11.9, Disp: 3 each, Rfl: 3   diltiazem (CARDIZEM) 30 MG tablet, Take 1 tablet daily only as needed for SVT's, Disp: 90 tablet, Rfl: 1   glucose blood (TRUE METRIX BLOOD GLUCOSE TEST) test strip, Test BS daily and as needed Dx E11.9, Disp: 100 each, Rfl: 3   hydrochlorothiazide (HYDRODIURIL) 12.5 MG tablet, Take 1 tablet by mouth once daily, Disp: 90 tablet, Rfl: 0   losartan (COZAAR) 100 MG tablet, TAKE 1 TABLET EVERY DAY, Disp: 90 tablet, Rfl: 0   metFORMIN (GLUCOPHAGE) 500 MG tablet, Take 2 tablets (1,000 mg total) by mouth daily with breakfast AND 1 tablet (500 mg total) daily with supper., Disp: 270 tablet, Rfl: 1   omeprazole (PRILOSEC) 40 MG capsule, Take 40 mg by mouth daily., Disp: , Rfl:    simvastatin (ZOCOR) 20 MG tablet, Take 1 tablet (20 mg total) by mouth daily at 6 PM., Disp: 90 tablet, Rfl: 1   TRUEplus Lancets 33G MISC, Test BS daily and as needed Dx E11.9, Disp: 100 each, Rfl: 3    Allergies  Allergen Reactions   Codeine Nausea Only   Neosporin [Bacitracin-Polymyxin B] Rash   Past Medical History:  Diagnosis Date   Diabetes mellitus    Difficulty swallowing    Diverticulitis    Essential hypertension     Past Surgical History:  Procedure Laterality Date   ABDOMINAL HYSTERECTOMY     APPENDECTOMY     CHOLECYSTECTOMY     SPINAL CORD DECOMPRESSION  1996    Social History   Socioeconomic History   Marital status: Married    Spouse name: Not on file   Number of children: 2   Years of education: Not on file   Highest education level: Not on file  Occupational History   Occupation: Retired Consulting civil engineer  Tobacco Use   Smoking status: Never   Smokeless tobacco: Never  Vaping  Use   Vaping Use: Never used  Substance and Sexual Activity   Alcohol use: No   Drug use: No   Sexual activity: Not on file  Other Topics Concern   Not on file  Social History Narrative   Not on file   Social Determinants of Health   Financial Resource Strain: Not on file  Food Insecurity: Not on file  Transportation Needs: Not on file  Physical Activity: Not on file  Stress: Not on file  Social Connections: Not on file  Intimate Partner Violence: Not on file        Objective:    BP 128/78   Pulse 82   Temp 97.8 F (36.6 C) (Temporal)   Ht 5' 3"  (1.6 m)   Wt 158 lb 6.4 oz (71.8 kg)   SpO2 100%   BMI 28.06 kg/m   Wt Readings from Last 3 Encounters:  05/15/21 158 lb 6.4 oz (71.8 kg)  03/08/21 161 lb 12.8 oz (73.4 kg)  01/11/21 161 lb (73 kg)    Physical Exam Vitals reviewed.  Constitutional:      General: She is not in acute distress.    Appearance: Normal appearance. She is overweight. She is not ill-appearing, toxic-appearing or diaphoretic.  HENT:     Head: Normocephalic and atraumatic.  Eyes:     General: No scleral icterus.       Right eye: No discharge.        Left eye: No discharge.     Conjunctiva/sclera: Conjunctivae normal.   Cardiovascular:     Rate and Rhythm: Normal rate and regular rhythm.     Heart sounds: Normal heart sounds. No murmur heard.   No friction rub. No gallop.  Pulmonary:     Effort: Pulmonary effort is normal. No respiratory distress.     Breath sounds: Normal breath sounds. No stridor. No wheezing, rhonchi or rales.  Musculoskeletal:        General: Normal range of motion.     Cervical back: Normal range of motion.       Feet:  Feet:     Comments: Area of pain where blister was previously. Area is protruded outwards, but patient states this is not new. Skin:    General: Skin is warm and dry.     Capillary Refill: Capillary refill takes less than 2 seconds.  Neurological:     General: No focal deficit present.     Mental Status: She is alert and oriented to person, place, and time. Mental status is at baseline.  Psychiatric:        Mood and Affect: Mood normal.        Behavior: Behavior normal.        Thought Content: Thought content normal.        Judgment: Judgment normal.    No results found for: TSH Lab Results  Component Value Date   WBC 5.8 01/11/2021   HGB 11.5 01/11/2021   HCT 34.8 01/11/2021   MCV 89 01/11/2021   PLT 290 01/11/2021   Lab Results  Component Value Date   NA 139 01/11/2021   K 4.6 01/11/2021   CO2 25 01/11/2021   GLUCOSE 214 (H) 01/11/2021   BUN 10 01/11/2021   CREATININE 1.05 (H) 01/11/2021   BILITOT 0.5 01/11/2021   ALKPHOS 102 01/11/2021   AST 14 01/11/2021   ALT 14 01/11/2021   PROT 6.3 01/11/2021   ALBUMIN 3.9 01/11/2021   CALCIUM 9.2 01/11/2021   ANIONGAP  7 11/06/2015   EGFR 59 (L) 01/11/2021   Lab Results  Component Value Date   CHOL 183 01/11/2021   Lab Results  Component Value Date   HDL 86 01/11/2021   Lab Results  Component Value Date   LDLCALC 80 01/11/2021   Lab Results  Component Value Date   TRIG 96 01/11/2021   Lab Results  Component Value Date   CHOLHDL 2.1 01/11/2021   Lab Results  Component Value Date    HGBA1C 6.7 01/11/2021

## 2021-05-16 LAB — MICROALBUMIN / CREATININE URINE RATIO
Creatinine, Urine: 104.9 mg/dL
Microalb/Creat Ratio: 4 mg/g creat (ref 0–29)
Microalbumin, Urine: 3.9 ug/mL

## 2021-05-23 ENCOUNTER — Encounter: Payer: Self-pay | Admitting: Family Medicine

## 2021-05-23 DIAGNOSIS — N6321 Unspecified lump in the left breast, upper outer quadrant: Secondary | ICD-10-CM

## 2021-05-24 NOTE — Telephone Encounter (Signed)
Spoke with the breast center and they said the [place] from may was still valid. States that Dr. Steele Berg ordered so the orders would go back to her.  If they are needing to go back to you then we will need a new order.

## 2021-05-24 NOTE — Telephone Encounter (Signed)
Can someone call the Breast [facility] and clarify this? It looks to me like the orders are already in.

## 2021-06-21 ENCOUNTER — Other Ambulatory Visit: Payer: Self-pay | Admitting: Occupational Therapy

## 2021-06-21 ENCOUNTER — Ambulatory Visit
Admission: RE | Admit: 2021-06-21 | Discharge: 2021-06-21 | Disposition: A | Discharge status: Home / Self Care | Payer: Medicare HMO | Source: Ambulatory Visit | Attending: Family Medicine | Admitting: Family Medicine

## 2021-06-21 ENCOUNTER — Other Ambulatory Visit: Payer: Self-pay | Admitting: Family Medicine

## 2021-06-21 DIAGNOSIS — Z1231 Encounter for screening mammogram for malignant neoplasm of breast: Secondary | ICD-10-CM

## 2021-06-21 DIAGNOSIS — N644 Mastodynia: Secondary | ICD-10-CM

## 2021-06-21 DIAGNOSIS — N6321 Unspecified lump in the left breast, upper outer quadrant: Secondary | ICD-10-CM

## 2021-06-21 DIAGNOSIS — R922 Inconclusive mammogram: Secondary | ICD-10-CM | POA: Diagnosis not present

## 2021-08-05 ENCOUNTER — Encounter: Payer: Self-pay | Admitting: Cardiovascular Disease

## 2021-08-05 DIAGNOSIS — R000 Tachycardia, unspecified: Secondary | ICD-10-CM

## 2021-08-06 ENCOUNTER — Ambulatory Visit (INDEPENDENT_AMBULATORY_CARE_PROVIDER_SITE_OTHER): Payer: Medicare HMO

## 2021-08-06 ENCOUNTER — Ambulatory Visit (INDEPENDENT_AMBULATORY_CARE_PROVIDER_SITE_OTHER): Payer: Medicare HMO | Admitting: Nurse Practitioner

## 2021-08-06 ENCOUNTER — Encounter: Payer: Self-pay | Admitting: Nurse Practitioner

## 2021-08-06 VITALS — BP 146/75 | HR 81 | Temp 98.3°F | Resp 20 | Wt 157.4 lb

## 2021-08-06 DIAGNOSIS — R000 Tachycardia, unspecified: Secondary | ICD-10-CM | POA: Diagnosis not present

## 2021-08-06 DIAGNOSIS — I471 Supraventricular tachycardia: Secondary | ICD-10-CM

## 2021-08-06 MED ORDER — DILTIAZEM HCL 30 MG PO TABS: 30.0000 mg | ORAL_TABLET | Freq: Four times a day (QID) | ORAL | 1 refills | Status: AC | PRN

## 2021-08-06 NOTE — Progress Notes (Signed)
° °  Subjective:    Patient ID: Cassie Hunter, female    DOB: [birth year], 66 y.o.   MRN: [id number]   Chief Complaint: Tachycardia   HPI Patient comes in today c/o heart racing. She has runs of SVT. She had several runs over the weekend . Episodes lasted 1-2 hours. Has some SOB. SHe tried vagal manuvers but did not help.She contacted her cardiologist. They contacted her just prior to this appointment. That are mailing her a cardiac monitor to wear for 14 days. They wanted her to go ahead and come here to be seen. Currently feels normal with normal heart rate.     Review of Systems  Constitutional:  Negative for diaphoresis.  Eyes:  Negative for pain.  Respiratory:  Negative for shortness of breath.   Cardiovascular:  Negative for chest pain, palpitations and leg swelling.  Gastrointestinal:  Negative for abdominal pain.  Endocrine: Negative for polydipsia.  Skin:  Negative for rash.  Neurological:  Negative for dizziness, weakness and headaches.  Hematological:  Does not bruise/bleed easily.  All other systems reviewed and are negative.     Objective:   Physical Exam Constitutional:      Appearance: Normal appearance.  Cardiovascular:     Rate and Rhythm: Normal rate.  Pulmonary:     Effort: Pulmonary effort is normal.     Breath sounds: Normal breath sounds.  Skin:    General: Skin is warm.  Neurological:     General: No focal deficit present.     Mental Status: She is alert and oriented to person, place, and time.  Psychiatric:        Mood and Affect: Mood normal.        Behavior: Behavior normal.      BP (!) 146/75    Pulse 81    Temp 98.3 F (36.8 C) (Temporal)    Resp 20    Wt 157 lb 6.4 oz (71.4 kg)    SpO2 100%    BMI 27.88 kg/m  EKG-NSR-Preliminary reading by Ronnald Collum, FNP  [facility]     Assessment & Plan:   Cassie Hunter in today with chief complaint of Tachycardia   1. Tachycardia - EKG 12-Lead - EKG 12-Lead  2. Paroxysmal SVT  (supraventricular tachycardia) (HCC) Avoid all caffeine Reviewed vagal manuvers again Apply heart monitor when receive it To ED if occurs and does not go away within2 hours.    The above assessment and management plan was discussed with the patient. The patient verbalized understanding of and has agreed to the management plan. Patient is aware to call the clinic if symptoms persist or worsen. Patient is aware when to return to the clinic for a follow-up visit. Patient educated on when it is appropriate to go to the emergency department.   Mary-Margaret Hassell Done, FNP

## 2021-08-06 NOTE — Progress Notes (Unsigned)
Enrolled for [employer] to mail a ZIO XT long term holter monitor to the patients address on file.  

## 2021-08-06 NOTE — Patient Instructions (Signed)
Supraventricular Tachycardia, Adult °Supraventricular tachycardia (SVT) is a kind of abnormal heartbeat. It makes your heart beat very fast. This may last for a short time and then return to normal, or it may last longer. °A normal resting heartbeat is 60-100 times a minute. This condition can make your heart beat more than 150 times a minute. Times of having a fast heartbeat (episodes) can be scary, but they are usually not dangerous. In some cases, they may lead to heart failure if they: °Happen many times a day. °Last longer than a few seconds. °What are the causes? °This condition happens when electrical signals are sent out from areas of the heart that do not normally send signals for the heartbeat. °What increases the risk? °You are more likely to develop this condition if you are: °Middle aged or younger. °Female. °The following factors may also make you more likely to develop this condition: °Stress. °Feeling worried or nervous (anxiety). °Tiredness. °Smoking. °Stimulant drugs, such as cocaine and methamphetamine. °Alcohol. °Caffeine. °Pregnancy. °Having certain medical conditions. °What are the signs or symptoms? °A pounding heart. °A feeling that your heart is skipping beats (palpitations). °Weakness. °Trouble getting enough air. °Pain or tightness in your chest. °Dizziness or feeling like you are going to pass out (faint). °Feeling worried or nervous. °Sweating. °Feeling like you may vomit (nausea). °Passing out. °Tiredness. °Sometimes, there are no symptoms. °How is this treated? °Treatment may include: °Vagal nerve stimulation. Ways to do this include: °Holding your breath and pushing, as though you are pooping (having a bowel movement). °Massaging an area on one side of your neck. Do not try this yourself. Only a doctor should do this. If done the wrong way, it can lead to a stroke. °Bending forward with your head between your legs. °Coughing while bending forward with your head between your  legs. °Putting an ice-cold, wet towel on your face. °Medicines that prevent attacks. °Medicine to stop an attack given through an IV tube at the hospital. °A small electric shock (cardioversion) that stops an attack. °A procedure to get rid of cells in the area that is causing the fast heartbeats (radiofrequency ablation). °If you do not have symptoms, you may not need treatment. °Follow these instructions at home: °Stress °Avoid things that make you feel stressed. °To deal with stress, try: °Doing yoga or meditation. °Being out in nature. °Listening to relaxing music. °Doing deep breathing. °Taking steps to be healthy, such as getting lots of sleep, exercising, and eating a balanced diet. °Talking with a mental health doctor. °Lifestyle ° °Try to get at least 7 hours of sleep each night. °Do not smoke or use any products that contain nicotine or tobacco. If you need help quitting, ask your doctor. °Do not drink alcohol if it gives you a fast heartbeat. °If alcohol does not seem to give you a fast heartbeat, limit your alcohol use. If you drink alcohol: °Limit how much you have to: °0-1 drink a day for women who are not pregnant. °0-2 drinks a day for men. °Know how much alcohol is in your drink. In the [place] one drink equals one 12 oz bottle of beer (355 mL), one 5 oz glass of wine (148 mL), or one 1½ oz glass of hard liquor (44 mL). °Be aware of how caffeine affects you. °If caffeine gives you a fast heartbeat, do not eat, drink, or use anything with caffeine in it. °If caffeine does not seem to give you a fast heartbeat, limit how much caffeine   you eat, drink, or use. °Do not use stimulant drugs. If you need help quitting, ask your doctor. °General instructions °Stay at a healthy weight. °Exercise regularly. Ask your doctor about good activities for you. Try one or a mixture of these: °150 minutes a week of gentle exercise, like walking or yoga. °75 minutes a week of exercise that is very active, like running or  swimming. °Do vagus nerve treatments to slow down your heartbeat as told by your doctor. °Take over-the-counter and prescription medicines only as told by your doctor. °Keep all follow-up visits. °Contact a doctor if: °You have a fast heartbeat more often. °Times of having a fast heartbeat last longer than before. °Home treatments to slow down your heartbeat do not help. °You have new symptoms. °Get help right away if: °You have chest pain. °Your symptoms get worse. °You have trouble breathing. °Your heart beats very fast for more than 20 minutes. °You pass out. °These symptoms may be an emergency. Get medical help right away. Call your local emergency services (911 in the [place] °Do not wait to see if the symptoms will go away. °Do not drive yourself to the hospital. °Summary °SVT is a type of abnormal heartbeat. °This condition can make your heart beat more than 150 times a minute. °If you do not have symptoms, you may not need treatment. °This information is not intended to replace advice given to you by your health care provider. Make sure you discuss any questions you have with your health care provider. °Document Revised: 02/12/2020 Document Reviewed: 02/12/2020 °Elsevier Patient Education © 2022 [employer] ° °

## 2021-08-06 NOTE — Telephone Encounter (Signed)
Spoke with pt.  She reports fatigue, feeling heart racing.  Multiple occurrences including waking her at 5 am this morning.  She is unaware of any trigger.  Is seeing PCP this afternoon.    Reviewed with Dr. Angelena Form who recommends a 14 day Zio monitor and to change Cardizem 30 mg to four times daily as needed and possibly an EP referral.  He would like to see her EKG and BP from primary care today.  Pt reports her BP at home is 151/70 about an hour ago.  She is crushing the tablets due to being unable to swallow.  Said she needs her esophagus stretched but that procedure has not been arranged yet.  Reviewed above recommendations with patient who voices understanding and is grateful for assistance provided today.

## 2021-08-07 NOTE — Telephone Encounter (Signed)
Patient was seen by PCP yesterday.  EKG did not show SVT at that time.  Zio heart monitor has been sent to patient.  She currently has follow up at the end of Feb with Dr. Clifton James.

## 2021-08-08 DIAGNOSIS — R000 Tachycardia, unspecified: Secondary | ICD-10-CM | POA: Diagnosis not present

## 2021-08-09 ENCOUNTER — Encounter: Payer: Self-pay | Admitting: Cardiovascular Disease

## 2021-08-16 ENCOUNTER — Emergency Department (HOSPITAL_COMMUNITY)
Admission: EM | Admit: 2021-08-16 | Discharge: 2021-08-16 | Disposition: A | Discharge status: Home / Self Care | Payer: Medicare HMO | Attending: Emergency Medicine | Admitting: Emergency Medicine

## 2021-08-16 ENCOUNTER — Other Ambulatory Visit: Payer: Self-pay

## 2021-08-16 ENCOUNTER — Encounter (HOSPITAL_COMMUNITY): Payer: Self-pay | Admitting: Emergency Medicine

## 2021-08-16 ENCOUNTER — Emergency Department (HOSPITAL_COMMUNITY): Payer: Medicare HMO

## 2021-08-16 DIAGNOSIS — Z7984 Long term (current) use of oral hypoglycemic drugs: Secondary | ICD-10-CM | POA: Insufficient documentation

## 2021-08-16 DIAGNOSIS — E871 Hypo-osmolality and hyponatremia: Secondary | ICD-10-CM | POA: Diagnosis not present

## 2021-08-16 DIAGNOSIS — R070 Pain in throat: Secondary | ICD-10-CM | POA: Diagnosis present

## 2021-08-16 DIAGNOSIS — E041 Nontoxic single thyroid nodule: Secondary | ICD-10-CM

## 2021-08-16 DIAGNOSIS — Z7982 Long term (current) use of aspirin: Secondary | ICD-10-CM | POA: Insufficient documentation

## 2021-08-16 DIAGNOSIS — I10 Essential (primary) hypertension: Secondary | ICD-10-CM | POA: Diagnosis not present

## 2021-08-16 DIAGNOSIS — R0989 Other specified symptoms and signs involving the circulatory and respiratory systems: Secondary | ICD-10-CM

## 2021-08-16 DIAGNOSIS — E119 Type 2 diabetes mellitus without complications: Secondary | ICD-10-CM | POA: Diagnosis not present

## 2021-08-16 DIAGNOSIS — Z79899 Other long term (current) drug therapy: Secondary | ICD-10-CM | POA: Diagnosis not present

## 2021-08-16 DIAGNOSIS — R221 Localized swelling, mass and lump, neck: Secondary | ICD-10-CM | POA: Diagnosis not present

## 2021-08-16 LAB — CBC
HCT: 36.6 % (ref 36.0–46.0)
Hemoglobin: 12.8 g/dL (ref 12.0–15.0)
MCH: 28.8 pg (ref 26.0–34.0)
MCHC: 35 g/dL (ref 30.0–36.0)
MCV: 82.2 fL (ref 80.0–100.0)
Platelets: 339 10*3/uL (ref 150–400)
RBC: 4.45 MIL/uL (ref 3.87–5.11)
RDW: 12.3 % (ref 11.5–15.5)
WBC: 8.7 10*3/uL (ref 4.0–10.5)
nRBC: 0 % (ref 0.0–0.2)

## 2021-08-16 LAB — COMPREHENSIVE METABOLIC PANEL
ALT: 15 U/L (ref 0–44)
AST: 21 U/L (ref 15–41)
Albumin: 4.1 g/dL (ref 3.5–5.0)
Alkaline Phosphatase: 72 U/L (ref 38–126)
Anion gap: 11 (ref 5–15)
BUN: 13 mg/dL (ref 8–23)
CO2: 26 mmol/L (ref 22–32)
Calcium: 8.9 mg/dL (ref 8.9–10.3)
Chloride: 88 mmol/L — ABNORMAL LOW (ref 98–111)
Creatinine, Ser: 0.94 mg/dL (ref 0.44–1.00)
GFR, Estimated: >60 mL/min (ref >60)
Glucose, Bld: 127 mg/dL — ABNORMAL HIGH (ref 70–99)
Potassium: 3.5 mmol/L (ref 3.5–5.1)
Sodium: 125 mmol/L — ABNORMAL LOW (ref 135–145)
Total Bilirubin: 0.7 mg/dL (ref 0.3–1.2)
Total Protein: 7.2 g/dL (ref 6.5–8.1)

## 2021-08-16 LAB — TROPONIN I (HIGH SENSITIVITY): Troponin I (High Sensitivity): 3 ng/L (ref <18)

## 2021-08-16 MED ORDER — METHYLPREDNISOLONE SODIUM SUCC 125 MG IJ SOLR
125.0000 mg | Freq: Once | INTRAMUSCULAR | Status: AC
Start: 2021-08-16 — End: 2021-08-16
  Administered 2021-08-16: 125 mg via INTRAVENOUS
  Filled 2021-08-16: qty 2

## 2021-08-16 MED ORDER — DIPHENHYDRAMINE HCL 50 MG/ML IJ SOLN
25.0000 mg | Freq: Once | INTRAMUSCULAR | Status: AC
Start: 2021-08-16 — End: 2021-08-16
  Administered 2021-08-16: 25 mg via INTRAVENOUS
  Filled 2021-08-16: qty 1

## 2021-08-16 MED ORDER — PREDNISOLONE 15 MG/5ML PO SOLN: 45.0000 mg | Freq: Every day | ORAL | 0 refills | Status: AC

## 2021-08-16 MED ORDER — IOHEXOL 300 MG/ML  SOLN
75.0000 mL | Freq: Once | INTRAMUSCULAR | Status: AC | PRN
  Administered 2021-08-16: 75 mL via INTRAVENOUS

## 2021-08-16 MED ORDER — SODIUM CHLORIDE 0.9 % IV BOLUS
500.0000 mL | Freq: Once | INTRAVENOUS | Status: AC
Start: 2021-08-16 — End: 2021-08-16
  Administered 2021-08-16: 500 mL via INTRAVENOUS

## 2021-08-16 NOTE — ED Provider Notes (Signed)
[facility] EMERGENCY DEPARTMENT Provider Note   CSN: [id number] Arrival date & time: 08/16/21  1726     History  No chief complaint on file.   Cassie Hunter is a 66 y.o. female.  HPI  This patient is a 66 year old female, she has a history of acid reflux, she also has a history of diabetes on metformin, she is on losartan and hydrochlorothiazide and diltiazem because of a history of hypertension and a recent history of SVT.  She is currently wearing a cardiac monitor.  The patient reports to me that she has had an episode this evening about an hour and a half ago where she felt very tight in her throat, it is a discomfort that she has not felt in the past, like it was difficult for her to swallow and changed her voice a little bit.  She has not recently started any new medications.  She does have a history of prior esophageal stricture but this feels much different and feels like it is much more up in her throat.  She is not itchy has no rashes and has no other signs of allergic reaction.  She has never had anything like this.  She has been able to tolerate some liquids  The patient has no chest pain whatsoever, she is not short of breath  Home Medications Prior to Admission medications   Medication Sig Start Date End Date Taking? Authorizing Provider  prednisoLONE (PRELONE) 15 MG/5ML SOLN Take 15 mLs (45 mg total) by mouth daily for 5 days. 08/16/21 08/21/21 Yes Eber Hong, MD  albuterol (VENTOLIN HFA) 108 (90 Base) MCG/ACT inhaler Inhale 2 puffs into the lungs every 6 (six) hours as needed. 10/12/20   Gwenlyn Fudge, FNP  Alcohol Swabs (B-D SINGLE USE SWABS REGULAR) PADS Test BS daily and as needed Dx E11.9 05/15/20   Dettinger, Elige Radon, MD  aspirin 81 MG EC tablet Take 1 tablet (81 mg total) by mouth daily. Swallow whole. 05/15/21   Gwenlyn Fudge, FNP  Blood Glucose Calibration (TRUE METRIX LEVEL 1) Low SOLN Use with glucose monitor Dx E11.9 05/11/20   Dettinger, Elige Radon, MD   diltiazem (CARDIZEM) 30 MG tablet Take 1 tablet (30 mg total) by mouth 4 (four) times daily as needed. 08/06/21   Kathleene Hazel, MD  glucose blood (TRUE METRIX BLOOD GLUCOSE TEST) test strip Test BS daily and as needed Dx E11.9 05/15/20   Dettinger, Elige Radon, MD  hydrochlorothiazide (HYDRODIURIL) 12.5 MG tablet Take 1 tablet (12.5 mg total) by mouth daily. 05/15/21   Gwenlyn Fudge, FNP  losartan (COZAAR) 100 MG tablet TAKE 1 TABLET EVERY DAY 03/12/21   Gwenlyn Fudge, FNP  metFORMIN (GLUCOPHAGE) 500 MG tablet Take 2 tablets (1,000 mg total) by mouth daily with breakfast AND 1 tablet (500 mg total) daily with supper. 05/15/21   Gwenlyn Fudge, FNP  omeprazole (PRILOSEC) 40 MG capsule Take 40 mg by mouth daily.    [provider]  simvastatin (ZOCOR) 20 MG tablet Take 1 tablet (20 mg total) by mouth daily at 6 PM. 05/15/21   Gwenlyn Fudge, FNP  TRUEplus Lancets 33G MISC Test BS daily and as needed Dx E11.9 05/15/20   Dettinger, Elige Radon, MD      Allergies    Codeine and Neosporin [bacitracin-polymyxin b]    Review of Systems   Review of Systems  All other systems reviewed and are negative.  Physical Exam Updated Vital Signs BP 124/69  Pulse 78    Temp 98.1 F (36.7 C)    Resp 18    Ht 1.6 m (5\' 3" )    Wt 71.4 kg    SpO2 98%    BMI 27.88 kg/m  Physical Exam Vitals and nursing note reviewed.  Constitutional:      General: She is not in acute distress.    Appearance: She is well-developed.  HENT:     Head: Normocephalic and atraumatic.     Mouth/Throat:     Pharynx: No oropharyngeal exudate.     Comments: The oropharynx is well-visualized, the uvula is midline and small, there is a clear oropharynx with what appears to be normal phonation Eyes:     General: No scleral icterus.       Right eye: No discharge.        Left eye: No discharge.     Conjunctiva/sclera: Conjunctivae normal.     Pupils: Pupils are equal, round, and reactive to light.  Neck:      Thyroid: No thyromegaly.     Vascular: No JVD.  Cardiovascular:     Rate and Rhythm: Normal rate and regular rhythm.     Heart sounds: Normal heart sounds. No murmur heard.   No friction rub. No gallop.  Pulmonary:     Effort: Pulmonary effort is normal. No respiratory distress.     Breath sounds: Normal breath sounds. No wheezing or rales.  Abdominal:     General: Bowel sounds are normal. There is no distension.     Palpations: Abdomen is soft. There is no mass.     Tenderness: There is no abdominal tenderness.  Musculoskeletal:        General: No tenderness. Normal range of motion.     Cervical back: Normal range of motion and neck supple.  Lymphadenopathy:     Cervical: No cervical adenopathy.  Skin:    General: Skin is warm and dry.     Findings: No erythema or rash.  Neurological:     Mental Status: She is alert.     Coordination: Coordination normal.  Psychiatric:        Behavior: Behavior normal.    ED Results / Procedures / Treatments   Labs (all labs ordered are listed, but only abnormal results are displayed) Labs Reviewed  COMPREHENSIVE METABOLIC PANEL - Abnormal; Notable for the following components:      Result Value   Sodium 125 (*)    Chloride 88 (*)    Glucose, Bld 127 (*)    All other components within normal limits  CBC  TROPONIN I (HIGH SENSITIVITY)    EKG EKG Interpretation  Date/Time:  Thursday August 16 2021 17:42:58 EST Ventricular Rate:  78 PR Interval:  172 QRS Duration: 117 QT Interval:  395 QTC Calculation: 450 R Axis:   -40 Text Interpretation: Sinus rhythm Incomplete RBBB and LAFB Low voltage, precordial leads Consider anterior infarct similar to prior 2017 EKG Confirmed by Eber HongMiller, Ivory Maduro ([id number]) on 08/16/2021 5:55:05 PM  Radiology CT Soft Tissue Neck W Contrast  Result Date: 08/16/2021 CLINICAL DATA:  Foreign body suspected, change in phonation, swelling in throat today EXAM: CT NECK WITH CONTRAST TECHNIQUE: Multidetector CT imaging  of the neck was performed using the standard protocol following the bolus administration of intravenous contrast. RADIATION DOSE REDUCTION: This exam was performed according to the departmental dose-optimization program which includes automated exposure control, adjustment of the mA and/or kV according to patient size and/or use of iterative reconstruction  technique. CONTRAST:  4mL OMNIPAQUE IOHEXOL 300 MG/ML  SOLN COMPARISON:  None. FINDINGS: Pharynx and larynx: Normal. No mass or swelling. Salivary glands: No inflammation, mass, or stone. Thyroid: 7 mm hypoenhancing focus in the right thyroid lobe. Atrophic or surgically absent left thyroid lobe. Lymph nodes: None enlarged or abnormal density. Vascular: Negative. Limited intracranial: Negative. Visualized orbits: Negative. Mastoids and visualized paranasal sinuses: Clear. Skeleton: No acute or aggressive process. Upper chest: Negative. Other: None. IMPRESSION: 1.  No acute process in the neck. 2. Subcentimeter hypoenhancing focus in the right thyroid lobe, for which no follow-up is recommended. (Reference: J Am Coll Radiol. 2015 Feb;12(2): 143-50) Electronically Signed   By: Wiliam Ke M.D.   On: 08/16/2021 19:30    Procedures Procedures    Medications Ordered in ED Medications  sodium chloride 0.9 % bolus 500 mL (has no administration in time range)  methylPREDNISolone sodium succinate (SOLU-MEDROL) 125 mg/2 mL injection 125 mg (125 mg Intravenous Given 08/16/21 1808)  diphenhydrAMINE (BENADRYL) injection 25 mg (25 mg Intravenous Given 08/16/21 1808)  iohexol (OMNIPAQUE) 300 MG/ML solution 75 mL (75 mLs Intravenous Contrast Given 08/16/21 1859)    ED Course/ Medical Decision Making/ A&P                           Medical Decision Making Amount and/or Complexity of Data Reviewed Labs: ordered. Radiology: ordered.  Risk Prescription drug management.   This patient presents to the ED for concern of tightening in the throat, this involves an  extensive number of treatment options, and is a complaint that carries with it a high risk of complications and morbidity.  The differential diagnosis includes allergic reaction, coronary syndrome, stricture, foreign body   Co morbidities that complicate the patient evaluation  Prior esophageal dysfunction\stricture.  Hypertension and diabetes   Additional history obtained:  Additional history obtained from electronic medical record External records from outside source obtained and reviewed including notes from cardiology regarding SVT, prior office visits for chronic conditions   Lab Tests:  I Ordered, and personally interpreted labs.  The pertinent results include: The patient has hyponatremia with a sodium of 125, she has no difficulties with balance or mental status, CBC is unremarkable   Imaging Studies ordered:  I ordered imaging studies including CT scan of the neck I independently visualized and interpreted imaging which showed no acute findings to suggest that there is something in the airway or esophagus causing concern, there is a small nodule in the thyroid, the patient was informed of these results I agree with the radiologist interpretation   Cardiac Monitoring:  The patient was maintained on a cardiac monitor.  I personally viewed and interpreted the cardiac monitored which showed an underlying rhythm of: Normal sinus rhythm, no arrhythmia, no SVT   Medicines ordered and prescription drug management:  I ordered medication including normal saline for hyponatremia Reevaluation of the patient after these medicines showed that the patient improved I have reviewed the patients home medicines and have made adjustments as needed   Critical Interventions:  Imaging to rule out obstructive lesions    Problem List / ED Course:  The patient was counseled regarding her history of esophageal stricture, she will need to have this dilated and she is aware that she needs  to have this happen.  She has been having trouble for quite some time swallowing solids and is requested liquid medicine for home.  I will place her on prednisone for  5 days, she will see her gastroenterologist to have her self dilated, she will continue to take sodium chloride and follow-up with PCP for recheck   Reevaluation:  After the interventions noted above, I reevaluated the patient and found that they have :improved   Social Determinants of Health:  None   Dispostion:  After consideration of the diagnostic results and the patients response to treatment, I feel that the patent would benefit from discharge home.          Final Clinical Impression(s) / ED Diagnoses Final diagnoses:  Hyponatremia  Globus sensation  Thyroid nodule    Rx / DC Orders ED Discharge Orders          Ordered    prednisoLONE (PRELONE) 15 MG/5ML SOLN  Daily        08/16/21 1946              Eber HongMiller, Shanna Un, MD 08/16/21 1948

## 2021-08-16 NOTE — ED Triage Notes (Signed)
Pt to the ED with complaints of her throat feeling tight beginning at 1800.  Pt states she has been nauseous since yesterday.

## 2021-08-16 NOTE — Discharge Instructions (Addendum)
Your sodium level was a little bit low, please make sure that you are taking lots of extra salt over the next couple of days and have your family doctor recheck your sodium level within 1 week.  Your thyroid gland had a small cyst, your family doctor can follow-up with you on this, please let them know about these results when you follow-up this week.  I do want you to take prednisone daily for 5 days to help with any swelling that may occur in your throat however at this time everything appears normal.  I do want you to see your gastroenterologist to have your esophagus stretched, this will help with swallowing significantly.  Emergency department for severe worsening symptoms

## 2021-08-21 ENCOUNTER — Ambulatory Visit (INDEPENDENT_AMBULATORY_CARE_PROVIDER_SITE_OTHER): Payer: Medicare HMO | Admitting: Family Medicine

## 2021-08-21 ENCOUNTER — Encounter: Payer: Self-pay | Admitting: Family Medicine

## 2021-08-21 VITALS — BP 138/70 | HR 84 | Temp 98.0°F | Ht 63.0 in | Wt 155.8 lb

## 2021-08-21 DIAGNOSIS — K219 Gastro-esophageal reflux disease without esophagitis: Secondary | ICD-10-CM | POA: Diagnosis not present

## 2021-08-21 DIAGNOSIS — R1319 Other dysphagia: Secondary | ICD-10-CM

## 2021-08-21 DIAGNOSIS — E1165 Type 2 diabetes mellitus with hyperglycemia: Secondary | ICD-10-CM

## 2021-08-21 DIAGNOSIS — E034 Atrophy of thyroid (acquired): Secondary | ICD-10-CM | POA: Diagnosis not present

## 2021-08-21 DIAGNOSIS — I700 Atherosclerosis of aorta: Secondary | ICD-10-CM | POA: Diagnosis not present

## 2021-08-21 DIAGNOSIS — E871 Hypo-osmolality and hyponatremia: Secondary | ICD-10-CM | POA: Diagnosis not present

## 2021-08-21 DIAGNOSIS — I10 Essential (primary) hypertension: Secondary | ICD-10-CM | POA: Diagnosis not present

## 2021-08-21 DIAGNOSIS — N6321 Unspecified lump in the left breast, upper outer quadrant: Secondary | ICD-10-CM | POA: Diagnosis not present

## 2021-08-21 LAB — CBC WITH DIFFERENTIAL/PLATELET
Basophils Absolute: 0.1 10*3/uL (ref 0.0–0.2)
Basos: 1 %
EOS (ABSOLUTE): 0.2 10*3/uL (ref 0.0–0.4)
Eos: 2 %
Hematocrit: 34.9 % (ref 34.0–46.6)
Hemoglobin: 11.8 g/dL (ref 11.1–15.9)
Immature Grans (Abs): 0 10*3/uL (ref 0.0–0.1)
Immature Granulocytes: 0 %
Lymphocytes Absolute: 1.9 10*3/uL (ref 0.7–3.1)
Lymphs: 21 %
MCH: 28.4 pg (ref 26.6–33.0)
MCHC: 33.8 g/dL (ref 31.5–35.7)
MCV: 84 fL (ref 79–97)
Monocytes Absolute: 0.6 10*3/uL (ref 0.1–0.9)
Monocytes: 7 %
Neutrophils Absolute: 6.4 10*3/uL (ref 1.4–7.0)
Neutrophils: 69 %
Platelets: 392 10*3/uL (ref 150–450)
RBC: 4.16 x10E6/uL (ref 3.77–5.28)
RDW: 12.1 % (ref 11.7–15.4)
WBC: 9.1 10*3/uL (ref 3.4–10.8)

## 2021-08-21 LAB — CMP14+EGFR
ALT: 13 IU/L (ref 0–32)
AST: 14 IU/L (ref 0–40)
Albumin/Globulin Ratio: 2 (ref 1.2–2.2)
Albumin: 4 g/dL (ref 3.8–4.8)
Alkaline Phosphatase: 79 IU/L (ref 44–121)
BUN/Creatinine Ratio: 16 (ref 12–28)
BUN: 16 mg/dL (ref 8–27)
Bilirubin Total: 0.3 mg/dL (ref 0.0–1.2)
CO2: 26 mmol/L (ref 20–29)
Calcium: 9.4 mg/dL (ref 8.7–10.3)
Chloride: 92 mmol/L — ABNORMAL LOW (ref 96–106)
Creatinine, Ser: 0.97 mg/dL (ref 0.57–1.00)
Globulin, Total: 2 g/dL (ref 1.5–4.5)
Glucose: 143 mg/dL — ABNORMAL HIGH (ref 70–99)
Potassium: 3.9 mmol/L (ref 3.5–5.2)
Sodium: 134 mmol/L (ref 134–144)
Total Protein: 6 g/dL (ref 6.0–8.5)
eGFR: 64 mL/min/{1.73_m2} (ref >59)

## 2021-08-21 LAB — LIPID PANEL
Chol/HDL Ratio: 1.8 ratio (ref 0.0–4.4)
Cholesterol, Total: 171 mg/dL (ref 100–199)
HDL: 97 mg/dL (ref >39)
LDL Chol Calc (NIH): 49 mg/dL (ref 0–99)
Triglycerides: 157 mg/dL — ABNORMAL HIGH (ref 0–149)
VLDL Cholesterol Cal: 25 mg/dL (ref 5–40)

## 2021-08-21 LAB — BAYER DCA HB A1C WAIVED: HB A1C (BAYER DCA - WAIVED): 6.1 % — ABNORMAL HIGH (ref 4.8–5.6)

## 2021-08-21 MED ORDER — LOSARTAN POTASSIUM 100 MG PO TABS: 100.0000 mg | ORAL_TABLET | Freq: Every day | ORAL | 1 refills | Status: DC

## 2021-08-21 MED ORDER — SIMVASTATIN 20 MG PO TABS: 20.0000 mg | ORAL_TABLET | Freq: Every day | ORAL | 1 refills | Status: DC

## 2021-08-21 NOTE — Patient Instructions (Signed)
Schedule your EGD with the gastroenterologist.

## 2021-08-21 NOTE — Progress Notes (Signed)
Assessment & Plan:  1. Type 2 diabetes mellitus with hyperglycemia, without long-term current use of insulin (HCC) Lab Results  Component Value Date   HGBA1C 6.1 (H) 08/21/2021   HGBA1C 6.4 (H) 05/15/2021   HGBA1C 6.7 01/11/2021    - Diabetes is at goal of A1c < 7. - Medications: continue current medications - Home glucose monitoring: continue monitoring - Patient is currently taking a statin. Patient is taking an ACE-inhibitor/ARB.   Diabetes Health Maintenance Due  Topic Date Due   FOOT EXAM  10/12/2021   HEMOGLOBIN A1C  02/18/2022   OPHTHALMOLOGY EXAM  02/21/2022    Lab Results  Component Value Date   LABMICR 3.9 05/15/2021   LABMICR 19.7 06/22/2019   - Lipid panel - CBC with Differential/Platelet - CMP14+EGFR - Bayer DCA Hb A1c Waived - simvastatin (ZOCOR) 20 MG tablet; Take 1 tablet (20 mg total) by mouth daily at 6 PM.  Dispense: 90 tablet; Refill: 1 - losartan (COZAAR) 100 MG tablet; Take 1 tablet (100 mg total) by mouth daily.  Dispense: 90 tablet; Refill: 1 - Vitamin B12  2. Essential hypertension, benign Well controlled on current regimen.  - Lipid panel - CBC with Differential/Platelet - CMP14+EGFR - losartan (COZAAR) 100 MG tablet; Take 1 tablet (100 mg total) by mouth daily.  Dispense: 90 tablet; Refill: 1  3. Aortic atherosclerosis (HCC) Continue simvastatin and aspirin. - Lipid panel - CMP14+EGFR - simvastatin (ZOCOR) 20 MG tablet; Take 1 tablet (20 mg total) by mouth daily at 6 PM.  Dispense: 90 tablet; Refill: 1  4. Gastroesophageal reflux disease, unspecified whether esophagitis present Encouraged to schedule EGD with GI. - CMP14+EGFR  5. Esophageal dysphagia Encouraged to schedule EGD with GI.  6. Mass of upper outer quadrant of left breast Resolved  7. Atrophy of thyroid - TSH  8. Hyponatremia - CMP14+EGFR   Return in about 6 months (around 02/18/2022) for annual physical.  Hendricks Limes, MSN, APRN, FNP-C [facility]  Subjective:    Patient ID: Cassie Hunter, female    DOB: 1955-11-16, 66 y.o.   MRN: [id number]  Patient Care Team: Loman Brooklyn, FNP as PCP - General (Family Medicine) Celestia Khat, [place] (Optometry)   Chief Complaint:  Chief Complaint  Patient presents with   Medical Management of Chronic Issues   ER follow up    08/16/21- AP- hyponatremia     HPI: Cassie Hunter is a 66 y.o. female presenting on 08/21/2021 for Medical Management of Chronic Issues and ER follow up (08/16/21- AP- hyponatremia )  Diabetes: Patient presents for follow up of diabetes. Current symptoms include: hyperglycemia. Known diabetic complications: none. Medication compliance: yes. Current diet: in general, a "healthy" diet  . Current exercise: none. Home blood sugar records:  fasting in the 120-140s and up to 160s if she doesn't eat good . Is she  on ACE inhibitor or angiotensin II receptor blocker? Yes (Losartan). Is she on a statin? Yes (Simvastatin). Last urine microalbumin/creatinine ratio completed in November 2022.  Hypertension: patient does not check her blood pressure at home. She does not add salt to her food.  Aortic Atherosclerosis: taking simvastatin and aspirin daily.   Difficulty Swallowing/GERD: patient is suppose to be having an EGD. She has been cleared by cardiology. She reports now she has to get this scheduled on a day her son is available to drive her. She is taking omeprazole 40 mg daily.   Breast mass: patient had a follow-up ultrasound of  her left breast in December 2022 at which time is showed resolution of the previously seen left breast mass. No further follow-up recommended; she is to return for routine annual screening due this month (scheduled for 08/27/2021).  New complaints: Patient was seen at [facility], ER on 08/16/2021 due to feeling very tight in her throat.  She had a CT scan of the neck which showed a small thyroid nodule on the right lobefor which no  follow-up is recommended and atrophy of the left lobe; no other acute abnormalities.  She was found to have hyponatremia and received IV fluids.  She was discharged on prednisone for any possible swelling and sodium chloride for hyponatremia with a recommendation to recheck her lab work.  It was also recommended that she see her gastroenterologist for dilation of her esophageal stricture.  Patient does not yet have an appointment with gastroenterology.   Social history:  Relevant past medical, surgical, family and social history reviewed and updated as indicated. Interim medical history since our last visit reviewed.  Allergies and medications reviewed and updated.  DATA REVIEWED: CHART IN EPIC  ROS: Negative unless specifically indicated above in HPI.    Current Outpatient Medications:    albuterol (VENTOLIN HFA) 108 (90 Base) MCG/ACT inhaler, Inhale 2 puffs into the lungs every 6 (six) hours as needed., Disp: 18 g, Rfl: 2   Alcohol Swabs (B-D SINGLE USE SWABS REGULAR) PADS, Test BS daily and as needed Dx E11.9, Disp: 100 each, Rfl: 3   aspirin 81 MG EC tablet, Take 1 tablet (81 mg total) by mouth daily. Swallow whole., Disp: 30 tablet, Rfl: 12   Blood Glucose Calibration (TRUE METRIX LEVEL 1) Low SOLN, Use with glucose monitor Dx E11.9, Disp: 3 each, Rfl: 3   diltiazem (CARDIZEM) 30 MG tablet, Take 1 tablet (30 mg total) by mouth 4 (four) times daily as needed., Disp: 60 tablet, Rfl: 1   glucose blood (TRUE METRIX BLOOD GLUCOSE TEST) test strip, Test BS daily and as needed Dx E11.9, Disp: 100 each, Rfl: 3   hydrochlorothiazide (HYDRODIURIL) 12.5 MG tablet, Take 1 tablet (12.5 mg total) by mouth daily., Disp: 90 tablet, Rfl: 1   losartan (COZAAR) 100 MG tablet, TAKE 1 TABLET EVERY DAY, Disp: 90 tablet, Rfl: 0   metFORMIN (GLUCOPHAGE) 500 MG tablet, Take 2 tablets (1,000 mg total) by mouth daily with breakfast AND 1 tablet (500 mg total) daily with supper., Disp: 270 tablet, Rfl: 1    omeprazole (PRILOSEC) 40 MG capsule, Take 40 mg by mouth daily., Disp: , Rfl:    prednisoLONE (PRELONE) 15 MG/5ML SOLN, Take 15 mLs (45 mg total) by mouth daily for 5 days., Disp: 75 mL, Rfl: 0   simvastatin (ZOCOR) 20 MG tablet, Take 1 tablet (20 mg total) by mouth daily at 6 PM., Disp: 90 tablet, Rfl: 1   TRUEplus Lancets 33G MISC, Test BS daily and as needed Dx E11.9, Disp: 100 each, Rfl: 3   Allergies  Allergen Reactions   Codeine Nausea Only and Other (See Comments)   Neosporin [Bacitracin-Polymyxin B] Rash   Past Medical History:  Diagnosis Date   Diabetes mellitus    Difficulty swallowing    Diverticulitis    Essential hypertension    GERD (gastroesophageal reflux disease)    History of esophageal stricture     Past Surgical History:  Procedure Laterality Date   ABDOMINAL HYSTERECTOMY     APPENDECTOMY     CHOLECYSTECTOMY     SPINAL CORD DECOMPRESSION  1996    Social History   Socioeconomic History   Marital status: Married    Spouse name: Not on file   Number of children: 2   Years of education: Not on file   Highest education level: Not on file  Occupational History   Occupation: Retired Consulting civil engineer  Tobacco Use   Smoking status: Never   Smokeless tobacco: Never  Vaping Use   Vaping Use: Never used  Substance and Sexual Activity   Alcohol use: No   Drug use: No   Sexual activity: Not on file  Other Topics Concern   Not on file  Social History Narrative   Not on file   Social Determinants of Health   Financial Resource Strain: Not on file  Food Insecurity: Not on file  Transportation Needs: Not on file  Physical Activity: Not on file  Stress: Not on file  Social Connections: Not on file  Intimate Partner Violence: Not on file        Objective:    BP 140/68    Pulse 84    Temp 98 F (36.7 C) (Temporal)    Ht _0  (1.6 m)    Wt 155 lb 12.8 oz (70.7 kg)    SpO2 100%    BMI 27.60 kg/m   Wt Readings from Last 3 Encounters:  08/21/21 155  lb 12.8 oz (70.7 kg)  08/16/21 157 lb 6.4 oz (71.4 kg)  08/06/21 157 lb 6.4 oz (71.4 kg)    Physical Exam Vitals reviewed.  Constitutional:      General: She is not in acute distress.    Appearance: Normal appearance. She is overweight. She is not ill-appearing, toxic-appearing or diaphoretic.  HENT:     Head: Normocephalic and atraumatic.  Eyes:     General: No scleral icterus.       Right eye: No discharge.        Left eye: No discharge.     Conjunctiva/sclera: Conjunctivae normal.  Cardiovascular:     Rate and Rhythm: Normal rate and regular rhythm.     Heart sounds: Normal heart sounds. No murmur heard.   No friction rub. No gallop.  Pulmonary:     Effort: Pulmonary effort is normal. No respiratory distress.     Breath sounds: Normal breath sounds. No stridor. No wheezing, rhonchi or rales.  Musculoskeletal:        General: Normal range of motion.     Cervical back: Normal range of motion.  Skin:    General: Skin is warm and dry.     Capillary Refill: Capillary refill takes less than 2 seconds.  Neurological:     General: No focal deficit present.     Mental Status: She is alert and oriented to person, place, and time. Mental status is at baseline.  Psychiatric:        Mood and Affect: Mood normal.        Behavior: Behavior normal.        Thought Content: Thought content normal.        Judgment: Judgment normal.    No results found for: TSH Lab Results  Component Value Date   WBC 8.7 08/16/2021   HGB 12.8 08/16/2021   HCT 36.6 08/16/2021   MCV 82.2 08/16/2021   PLT 339 08/16/2021   Lab Results  Component Value Date   NA 125 (L) 08/16/2021   K 3.5 08/16/2021   CO2 26 08/16/2021   GLUCOSE 127 (H) 08/16/2021  BUN 13 08/16/2021   CREATININE 0.94 08/16/2021   BILITOT 0.7 08/16/2021   ALKPHOS 72 08/16/2021   AST 21 08/16/2021   ALT 15 08/16/2021   PROT 7.2 08/16/2021   ALBUMIN 4.1 08/16/2021   CALCIUM 8.9 08/16/2021   ANIONGAP 11 08/16/2021   EGFR 67  05/15/2021   Lab Results  Component Value Date   CHOL 181 05/15/2021   Lab Results  Component Value Date   HDL 74 05/15/2021   Lab Results  Component Value Date   LDLCALC 92 05/15/2021   Lab Results  Component Value Date   TRIG 81 05/15/2021   Lab Results  Component Value Date   CHOLHDL 2.4 05/15/2021   Lab Results  Component Value Date   HGBA1C 6.4 (H) 05/15/2021

## 2021-08-22 LAB — TSH: TSH: 0.403 u[IU]/mL — ABNORMAL LOW (ref 0.450–4.500)

## 2021-08-22 LAB — VITAMIN B12: Vitamin B-12: 220 pg/mL — ABNORMAL LOW (ref 232–1245)

## 2021-08-27 ENCOUNTER — Ambulatory Visit: Payer: Medicare HMO

## 2021-08-27 DIAGNOSIS — R000 Tachycardia, unspecified: Secondary | ICD-10-CM | POA: Diagnosis not present

## 2021-08-30 ENCOUNTER — Telehealth: Payer: Self-pay | Admitting: *Deleted

## 2021-08-30 ENCOUNTER — Encounter: Payer: Self-pay | Admitting: Cardiovascular Disease

## 2021-08-30 MED ORDER — DILTIAZEM HCL ER COATED BEADS 120 MG PO CP24: 120.0000 mg | ORAL_CAPSULE | Freq: Every day | ORAL | 3 refills | Status: DC

## 2021-08-30 NOTE — Telephone Encounter (Signed)
Reviewed results and recommendations with the patient.  She voices understanding and agreement with plan.  Will bring list of BP readings w her to appointment on 2/28.  Aware if needed she can still use the short acting diltiazem.

## 2021-08-30 NOTE — Telephone Encounter (Signed)
-----   Message from Kathleene Hazel, MD sent at 08/28/2021  8:17 AM EST ----- She had six quick bursts of SVT, longest of 8 beats. I think if she is willing, we can start Cardizem CD 120 mg daily. Have her follow her BP at home. I see her on the 28th. If her BP runs low on this new med, we may cut her Losartan back to 50 mg daily. Thanks, chris

## 2021-09-05 ENCOUNTER — Encounter: Payer: Self-pay | Admitting: Cardiovascular Disease

## 2021-09-05 ENCOUNTER — Ambulatory Visit: Payer: Medicare HMO | Admitting: Cardiovascular Disease

## 2021-09-05 ENCOUNTER — Other Ambulatory Visit: Payer: Self-pay

## 2021-09-05 VITALS — BP 124/60 | HR 83 | Ht 63.0 in | Wt 157.4 lb

## 2021-09-05 DIAGNOSIS — I471 Supraventricular tachycardia: Secondary | ICD-10-CM | POA: Diagnosis not present

## 2021-09-05 NOTE — Progress Notes (Signed)
Chief Complaint  Patient presents with   Follow-up    SVT   History of Present Illness: 66 yo female with history of SVT, DM, hyperlipidemia, esophageal stricture and HTN who is here today for cardiac follow up. I saw her as a new patient for the evaluation of palpitations in August 2022. She has a history of esophageal stricture. She had a choking episode on May 2022. After this, she felt her heart racing and chest pain. EMS told her that her heart was racing and that she had SVT. She tried vagal maneuvers and then had IV adenosine and IV cardizem administered per EMS and converted to sinus. Echo September 2022 with LVEF=60-65% and no valve disease. She called our office in January 2023 and reported palpitations. Cardiac monitor February 2023 with 6 runs of SVT, longest 8 beats. Cardizem CD 120 mg was started daily.   She is here today for follow up. The patient denies any chest pain, dyspnea, lower extremity edema, orthopnea, PND, dizziness, near syncope or syncope. She has only had one episode of palpitations since starting the Cardizem.   Primary Care Physician: Cassie Brooklyn, FNP   Past Medical History:  Diagnosis Date   Diabetes mellitus    Difficulty swallowing    Diverticulitis    Essential hypertension    GERD (gastroesophageal reflux disease)    History of esophageal stricture     Past Surgical History:  Procedure Laterality Date   ABDOMINAL HYSTERECTOMY     APPENDECTOMY     CHOLECYSTECTOMY     SPINAL CORD DECOMPRESSION  1996    Current Outpatient Medications  Medication Sig Dispense Refill   albuterol (VENTOLIN HFA) 108 (90 Base) MCG/ACT inhaler Inhale 2 puffs into the lungs every 6 (six) hours as needed. 18 g 2   Alcohol Swabs (B-D SINGLE USE SWABS REGULAR) PADS Test BS daily and as needed Dx E11.9 100 each 3   aspirin 81 MG EC tablet Take 1 tablet (81 mg total) by mouth daily. Swallow whole. 30 tablet 12   Blood Glucose Calibration (TRUE METRIX LEVEL 1) Low SOLN  Use with glucose monitor Dx E11.9 3 each 3   diltiazem (CARDIZEM CD) 120 MG 24 hr capsule Take 1 capsule (120 mg total) by mouth daily. 90 capsule 3   diltiazem (CARDIZEM) 30 MG tablet Take 1 tablet (30 mg total) by mouth 4 (four) times daily as needed. 60 tablet 1   glucose blood (TRUE METRIX BLOOD GLUCOSE TEST) test strip Test BS daily and as needed Dx E11.9 100 each 3   hydrochlorothiazide (HYDRODIURIL) 12.5 MG tablet Take 1 tablet (12.5 mg total) by mouth daily. 90 tablet 1   losartan (COZAAR) 100 MG tablet Take 1 tablet (100 mg total) by mouth daily. 90 tablet 1   metFORMIN (GLUCOPHAGE) 500 MG tablet Take 2 tablets (1,000 mg total) by mouth daily with breakfast AND 1 tablet (500 mg total) daily with supper. 270 tablet 1   omeprazole (PRILOSEC) 40 MG capsule Take 40 mg by mouth daily.     simvastatin (ZOCOR) 20 MG tablet Take 1 tablet (20 mg total) by mouth daily at 6 PM. 90 tablet 1   TRUEplus Lancets 33G MISC Test BS daily and as needed Dx E11.9 100 each 3   No current facility-administered medications for this visit.    Allergies  Allergen Reactions   Codeine Nausea Only and Other (See Comments)   Neosporin [Bacitracin-Polymyxin B] Rash    Social History   Socioeconomic  History   Marital status: Married    Spouse name: Not on file   Number of children: 2   Years of education: Not on file   Highest education level: Not on file  Occupational History   Occupation: Retired Consulting civil engineer  Tobacco Use   Smoking status: Never   Smokeless tobacco: Never  Vaping Use   Vaping Use: Never used  Substance and Sexual Activity   Alcohol use: No   Drug use: No   Sexual activity: Not on file  Other Topics Concern   Not on file  Social History Narrative   Not on file   Social Determinants of Health   Financial Resource Strain: Not on file  Food Insecurity: Not on file  Transportation Needs: Not on file  Physical Activity: Not on file  Stress: Not on file  Social  Connections: Not on file  Intimate Partner Violence: Not on file    Family History  Problem Relation Age of Onset   Pancreatic cancer Father    Lung cancer Father    CVA Mother    Spina bifida Sister    Heart attack Maternal Grandmother    Heart attack Maternal Grandfather    Stroke Paternal Grandmother    Aneurysm Paternal Grandfather     Review of Systems:  As stated in the HPI and otherwise negative.   BP 124/60    Pulse 83    Ht 5\' 3"  (1.6 m)    Wt 157 lb 6.4 oz (71.4 kg)    SpO2 98%    BMI 27.88 kg/m   Physical Examination: General: Well developed, well nourished, NAD  HEENT: OP clear, mucus membranes moist  SKIN: warm, dry. No rashes. Neuro: No focal deficits  Musculoskeletal: Muscle strength 5/5 all ext  Psychiatric: Mood and affect normal  Neck: No JVD, no carotid bruits, no thyromegaly, no lymphadenopathy.  Lungs:Clear bilaterally, no wheezes, rhonci, crackles Cardiovascular: Regular rate and rhythm. No murmurs, gallops or rubs. Abdomen:Soft. Bowel sounds present. Non-tender.  Extremities: No lower extremity edema. Pulses are 2 + in the bilateral DP/PT.  EKG:  EKG is not ordered today.   Recent Labs: 08/21/2021: ALT 13; BUN 16; Creatinine, Ser 0.97; Hemoglobin 11.8; Platelets 392; Potassium 3.9; Sodium 134; TSH 0.403   Lipid Panel    Component Value Date/Time   CHOL 171 08/21/2021 1013   TRIG 157 (H) 08/21/2021 1013   HDL 97 08/21/2021 1013   CHOLHDL 1.8 08/21/2021 1013   LDLCALC 49 08/21/2021 1013     Wt Readings from Last 3 Encounters:  09/05/21 157 lb 6.4 oz (71.4 kg)  08/21/21 155 lb 12.8 oz (70.7 kg)  08/16/21 157 lb 6.4 oz (71.4 kg)      Assessment and Plan:   1. SVT: She has had less frequent palpitations since starting Cardizem CD. I will have her continue for now with short acting diltiazem for recurrent episodes. If the frequency of her episodes increases, will refer to EP to discuss ablation.   Current medicines are reviewed at length  with the patient today.  The patient does not have concerns regarding medicines.  The following changes have been made:  no change  Labs/ tests ordered today include:  No orders of the defined types were placed in this encounter.  Disposition:   F/U me in 12 months.   Signed, Lauree Chandler, MD 09/05/2021 2:20 PM    [facility] [address], [place], [place]  [address] Phone: [phone];  Fax: [phone]

## 2021-09-05 NOTE — Patient Instructions (Signed)
Medication Instructions:  Your physician recommends that you continue on your current medications as directed. Please refer to the Current Medication list given to you today.c *If you need a refill on your cardiac medications before your next appointment, please call your pharmacy*   Lab Work: NONE If you have labs (blood work) drawn today and your tests are completely normal, you will receive your results only by: Zap (if you have MyChart) OR A paper copy in the mail If you have any lab test that is abnormal or we need to change your treatment, we will call you to review the results.   Testing/Procedures: NONE   Follow-Up: At [facility], you and your health needs are our priority.  As part of our continuing mission to provide you with exceptional heart care, we have created designated Provider Care Teams.  These Care Teams include your primary Cardiologist (physician) and Advanced Practice Providers (APPs -  Physician Assistants and Nurse Practitioners) who all work together to provide you with the care you need, when you need it.  Your next appointment:   1 year(s)  The format for your next appointment:   In Person  Provider:   Lauree Chandler, MD

## 2021-09-10 ENCOUNTER — Other Ambulatory Visit: Payer: Self-pay | Admitting: Family Medicine

## 2021-09-10 DIAGNOSIS — E1165 Type 2 diabetes mellitus with hyperglycemia: Secondary | ICD-10-CM

## 2021-09-10 DIAGNOSIS — I10 Essential (primary) hypertension: Secondary | ICD-10-CM

## 2021-09-11 ENCOUNTER — Ambulatory Visit: Payer: Medicare HMO | Admitting: Cardiovascular Disease

## 2021-09-12 ENCOUNTER — Encounter: Payer: Medicare HMO | Admitting: Family Medicine

## 2021-09-12 ENCOUNTER — Ambulatory Visit
Admission: RE | Admit: 2021-09-12 | Discharge: 2021-09-12 | Disposition: A | Discharge status: Home / Self Care | Payer: Medicare HMO | Source: Ambulatory Visit | Attending: Family Medicine | Admitting: Family Medicine

## 2021-09-12 DIAGNOSIS — Z1231 Encounter for screening mammogram for malignant neoplasm of breast: Secondary | ICD-10-CM

## 2021-09-14 ENCOUNTER — Encounter: Payer: Self-pay | Admitting: Family Medicine

## 2021-09-14 ENCOUNTER — Other Ambulatory Visit: Payer: Self-pay

## 2021-09-14 DIAGNOSIS — E1165 Type 2 diabetes mellitus with hyperglycemia: Secondary | ICD-10-CM

## 2021-09-14 MED ORDER — METFORMIN HCL 500 MG PO TABS: ORAL_TABLET | ORAL | 0 refills | Status: DC

## 2021-10-11 ENCOUNTER — Ambulatory Visit (INDEPENDENT_AMBULATORY_CARE_PROVIDER_SITE_OTHER): Payer: Medicare HMO | Admitting: Family Medicine

## 2021-10-11 ENCOUNTER — Encounter: Payer: Self-pay | Admitting: Family Medicine

## 2021-10-11 ENCOUNTER — Ambulatory Visit (INDEPENDENT_AMBULATORY_CARE_PROVIDER_SITE_OTHER): Payer: Medicare HMO

## 2021-10-11 VITALS — BP 139/75 | HR 78 | Temp 98.0°F | Ht 63.0 in | Wt 155.4 lb

## 2021-10-11 DIAGNOSIS — M47812 Spondylosis without myelopathy or radiculopathy, cervical region: Secondary | ICD-10-CM | POA: Diagnosis not present

## 2021-10-11 DIAGNOSIS — G8929 Other chronic pain: Secondary | ICD-10-CM | POA: Diagnosis not present

## 2021-10-11 DIAGNOSIS — M542 Cervicalgia: Secondary | ICD-10-CM

## 2021-10-11 DIAGNOSIS — R1319 Other dysphagia: Secondary | ICD-10-CM | POA: Diagnosis not present

## 2021-10-11 DIAGNOSIS — R42 Dizziness and giddiness: Secondary | ICD-10-CM | POA: Diagnosis not present

## 2021-10-11 DIAGNOSIS — M4802 Spinal stenosis, cervical region: Secondary | ICD-10-CM | POA: Diagnosis not present

## 2021-10-11 MED ORDER — OMEPRAZOLE 40 MG PO CPDR: 40.0000 mg | DELAYED_RELEASE_CAPSULE | Freq: Every day | ORAL | 1 refills | Status: DC

## 2021-10-11 MED ORDER — NAPROXEN 500 MG PO TABS
500.0000 mg | ORAL_TABLET | Freq: Two times a day (BID) | ORAL | 0 refills | Status: DC | PRN
Start: 2021-10-11 — End: 2022-08-22

## 2021-10-11 NOTE — Progress Notes (Signed)
? ?Assessment & Plan:  ?1. Chronic neck pain ?Continue heating pad. Encouraged muscle rub. Start Naproxen and physical therapy. X-ray today. ?- naproxen (NAPROSYN) 500 MG tablet; Take 1 tablet (500 mg total) by mouth 2 (two) times daily as needed for moderate pain.  Dispense: 30 tablet; Refill: 0 ?- DG Cervical Spine Complete ?- Ambulatory referral to Physical Therapy ? ?2. Dizziness on standing ?Encouraged to discuss with cardiology since she is already established.  ? ? ?Follow up plan: Return if symptoms worsen or fail to improve. ? ?Deliah Boston, MSN, APRN, FNP-C ?[facility] ? ?Subjective:  ? ?Patient ID: Taiesha Bovard, female    DOB: [birth year], 66 y.o.   MRN: [id number] ? ?HPI: ?Nyasha Larena Ohnemus is a 66 y.o. female presenting on 10/11/2021 for Neck Pain (Back of neck pain that goes up back of head that has been ongoing.  She also has dizziness with it. ) ? ?Patient complains of neck pain that started March 2022. She describes a heaviness that goes across her shoulders and up her neck. Pain comes on when she is exercising or walking, which has caused her to stop walking. She was previously prescribed neck exercises to do at home, treated with Prednisone, and advised to use OTC NSAID and heating pad. ? ?Patient also reports dizziness that occurs when she stands up from bending over or getting up from being on her knees (such as when she is playing with her grandchildren or picking up toys). She states things go black for a few seconds and then she is back to normal. She has previously had orthostatic vital signs which were unremarkable.  ? ? ?ROS: Negative unless specifically indicated above in HPI.  ? ?Relevant past medical history reviewed and updated as indicated.  ? ?Allergies and medications reviewed and updated. ? ? ?Current Outpatient Medications:  ?  albuterol (VENTOLIN HFA) 108 (90 Base) MCG/ACT inhaler, Inhale 2 puffs into the lungs every 6 (six) hours as needed.,  Disp: 18 g, Rfl: 2 ?  Alcohol Swabs (B-D SINGLE USE SWABS REGULAR) PADS, Test BS daily and as needed Dx E11.9, Disp: 100 each, Rfl: 3 ?  aspirin 81 MG EC tablet, Take 1 tablet (81 mg total) by mouth daily. Swallow whole., Disp: 30 tablet, Rfl: 12 ?  Blood Glucose Calibration (TRUE METRIX LEVEL 1) Low SOLN, Use with glucose monitor Dx E11.9, Disp: 3 each, Rfl: 3 ?  diltiazem (CARDIZEM CD) 120 MG 24 hr capsule, Take 1 capsule (120 mg total) by mouth daily., Disp: 90 capsule, Rfl: 3 ?  diltiazem (CARDIZEM) 30 MG tablet, Take 1 tablet (30 mg total) by mouth 4 (four) times daily as needed., Disp: 60 tablet, Rfl: 1 ?  glucose blood (TRUE METRIX BLOOD GLUCOSE TEST) test strip, Test BS daily and as needed Dx E11.9, Disp: 100 each, Rfl: 3 ?  hydrochlorothiazide (HYDRODIURIL) 12.5 MG tablet, Take 1 tablet (12.5 mg total) by mouth daily., Disp: 90 tablet, Rfl: 1 ?  losartan (COZAAR) 100 MG tablet, TAKE 1 TABLET EVERY DAY, Disp: 90 tablet, Rfl: 1 ?  metFORMIN (GLUCOPHAGE) 500 MG tablet, Take 2 tablets (1,000 mg total) by mouth daily with breakfast AND 1 tablet (500 mg total) daily with supper., Disp: 90 tablet, Rfl: 0 ?  omeprazole (PRILOSEC) 40 MG capsule, Take 40 mg by mouth daily., Disp: , Rfl:  ?  simvastatin (ZOCOR) 20 MG tablet, Take 1 tablet (20 mg total) by mouth daily at 6 PM., Disp: 90 tablet, Rfl: 1 ?  TRUEplus Lancets 33G MISC, Test BS daily and as needed Dx E11.9, Disp: 100 each, Rfl: 3 ? ?Allergies  ?Allergen Reactions  ? Codeine Nausea Only and Other (See Comments)  ? Neosporin [Bacitracin-Polymyxin B] Rash  ? ? ?Objective:  ? ?BP 139/75   Pulse 78   Temp 98 ?F (36.7 ?C) (Temporal)   Ht 5\' 3"  (1.6 m)   Wt 155 lb 6.4 oz (70.5 kg)   SpO2 100%   BMI 27.53 kg/m?   ? ?Physical Exam ?Vitals reviewed.  ?Constitutional:   ?   General: She is not in acute distress. ?   Appearance: Normal appearance. She is not ill-appearing, toxic-appearing or diaphoretic.  ?HENT:  ?   Head: Normocephalic and atraumatic.  ?Eyes:  ?    General: No scleral icterus.    ?   Right eye: No discharge.     ?   Left eye: No discharge.  ?   Conjunctiva/sclera: Conjunctivae normal.  ?Cardiovascular:  ?   Rate and Rhythm: Normal rate.  ?Pulmonary:  ?   Effort: Pulmonary effort is normal. No respiratory distress.  ?Musculoskeletal:     ?   General: Normal range of motion.  ?   Cervical back: Normal range of motion. Tenderness present.  ?Skin: ?   General: Skin is warm and dry.  ?   Capillary Refill: Capillary refill takes less than 2 seconds.  ?Neurological:  ?   General: No focal deficit present.  ?   Mental Status: She is alert and oriented to person, place, and time. Mental status is at baseline.  ?Psychiatric:     ?   Mood and Affect: Mood normal.     ?   Behavior: Behavior normal.     ?   Thought Content: Thought content normal.     ?   Judgment: Judgment normal.  ? ? ? ? ? ? ?

## 2021-10-15 ENCOUNTER — Encounter: Payer: Self-pay | Admitting: Family Medicine

## 2021-10-25 ENCOUNTER — Ambulatory Visit: Payer: Medicare HMO | Attending: Family Medicine | Admitting: Physical Therapy

## 2021-10-25 DIAGNOSIS — M542 Cervicalgia: Secondary | ICD-10-CM | POA: Diagnosis not present

## 2021-10-25 DIAGNOSIS — R293 Abnormal posture: Secondary | ICD-10-CM | POA: Diagnosis not present

## 2021-10-25 DIAGNOSIS — G8929 Other chronic pain: Secondary | ICD-10-CM | POA: Insufficient documentation

## 2021-10-25 NOTE — Therapy (Signed)
?[facility] ?[address] ?[place], [place], [address] ?Phone: [phone]   Fax:  [phone] ? ?Physical Therapy Evaluation ? ?Patient Details  ?Name: Cassie Hunter [facility] ?MRN: [id number] ?Date of Birth: May 14, 1956 ?Referring Provider (PT): Deliah Boston FNP. ? ? ?Encounter Date: 10/25/2021 ? ? PT End of Session - 10/25/21 1516   ? ? Visit Number 1   ? Number of Visits 12   ? Date for PT Re-Evaluation 12/06/21   ? Authorization Type FOTO.   ? PT Start Time 0217   ? PT Stop Time 0309   ? PT Time Calculation (min) 52 min   ? Activity Tolerance Patient tolerated treatment well   ? Behavior During Therapy [facility] for tasks assessed/performed   ? ?  ?  ? ?  ? ? ?Past Medical History:  ?Diagnosis Date  ? Diabetes mellitus   ? Difficulty swallowing   ? Diverticulitis   ? Essential hypertension   ? GERD (gastroesophageal reflux disease)   ? History of esophageal stricture   ? ? ?Past Surgical History:  ?Procedure Laterality Date  ? ABDOMINAL HYSTERECTOMY    ? APPENDECTOMY    ? CHOLECYSTECTOMY    ? SPINAL CORD DECOMPRESSION  1996  ? ? ?There were no vitals filed for this visit. ? ? ? Subjective Assessment - 10/25/21 1517   ? ? Subjective The patient presents to the clinic today with c/o chronic neck pain tah has been ongoing for about a year.  Her pain at rest today is rated at a 3/10.  She reports that her pain can rise to as high as a 10/10 when performing certain household activities such as washing dishes and also walking with her twin grandchildren (67 years of age) and holding their hands.  She has also had some problems with dizziness that tends to happen when she bends over then come back upright.  She is going to consult with her Cardiologist about this.  Heat helps decreases her pain.   ? Pertinent History Cogential spine issue, spinal core decompression, C-section x 2 and DM.   ? How long can you sit comfortably? Unlimited.   ? How long can you stand comfortably? Varies but  better if in good posture.   ? Diagnostic tests Multilevel degenerative changes without acute osseous abnormality.   ? Patient Stated Goals Get out of pain.   ? Currently in Pain? Yes   ? Pain Score 3    ? Pain Location Neck   ? Pain Orientation Right;Left   ? Pain Descriptors / Indicators Aching   ? Pain Type Chronic pain   ? Pain Radiating Towards Toward both shoulders.   ? Pain Onset More than a month ago   ? Pain Frequency Constant   ? Aggravating Factors  See above.   ? Pain Relieving Factors See above.   ? ?  ?  ? ?  ? ? ? ? ? [facility] PT Assessment - 10/25/21 0001   ? ?  ? Assessment  ? Medical Diagnosis Chronic neck pain   ? Referring Provider (PT) Deliah Boston FNP.   ? Onset Date/Surgical Date --   ~one year.  ?  ? Precautions  ? Precautions None   ?  ? Restrictions  ? Weight Bearing Restrictions No   ?  ? Balance Screen  ? Has the patient fallen in the past 6 months Yes   ? How many times? 1.   ? Has the patient had a decrease  in activity level because of a fear of falling?  No   ? Is the patient reluctant to leave their home because of a fear of falling?  Yes   ?  ? Prior Function  ? Level of Independence Independent   ?  ? Observation/Other Assessments  ? Focus on Therapeutic Outcomes (FOTO)  Complete.   ?  ? Posture/Postural Control  ? Posture/Postural Control Postural limitations   ? Postural Limitations Rounded Shoulders;Forward head   ?  ? Deep Tendon Reflexes  ? DTR Assessment Site Biceps;Brachioradialis;Triceps   ? Biceps DTR 2+   ? Brachioradialis DTR 2+   ? Triceps DTR 2+   ?  ? ROM / Strength  ? AROM / PROM / Strength AROM;Strength   ?  ? AROM  ? Overall AROM Comments Active right cervical rotation to 45 degrees and left to 50 degrees.   ?  ? Strength  ? Overall Strength Comments Normal bilateral UE strength.   ?  ? Palpation  ? Palpation comment Diffuse bilateral cervical pain complaints over both UT's with an active trigger on the right.   ?  ? Ambulation/Gait  ? Gait Comments WNL.   ? ?  ?  ? ?   ? ? ? ? ? ? ? ? ? ? ? ? ? ?Objective measurements completed on examination: See above findings.  ? ? ? ? ? ? ? ? ? ? ? ? ? ? ? ? ? ? ? PT Long Term Goals - 10/25/21 1554   ? ?  ? PT LONG TERM GOAL #1  ? Title Independent with a HEP.   ? Time 6   ? Period Weeks   ? Status New   ?  ? PT LONG TERM GOAL #2  ? Title Increase active cervical rotation to 70 degrees+ so patient can turn head more easily while driving.   ? Time 6   ? Period Weeks   ? Status New   ?  ? PT LONG TERM GOAL #3  ? Title Walk with grandchildren with neck pain not > 3/10.   ? Time 6   ? Period Weeks   ? Status New   ? ?  ?  ? ?  ? ? ? ? ? ? ? ? ? Plan - 10/25/21 1545   ? ? Clinical Impression Statement The patient presents to O[facility] with c/o chronic neck pain that has been ongoing for about a year.  She states pain occurs on both sided of her neck.  As her activity level increases so does her pain.  Certain household activities and walking with young grandchildren while holding their hands can produce pain-levels as high as 10/10.  She has limited bilateral active cervical rotation.  Her bilateral UE DTR's are normal as is her UE strength.  She has had some problems with dizziness after returning to an upright position after being bent over.  She is to consult a Cardiologist for this.  Patient will benefit from skilled physical therapy intervention to address pain and deficits.   ? Personal Factors and Comorbidities Comorbidity 1;Other   ? Comorbidities Cogential spine issue, spinal core decompression, C-section x 2 and DM.   ? Examination-Activity Limitations Other;Stand   ? Examination-Participation Restrictions Other;Meal Prep   ? Stability/Clinical Decision Making Stable/Uncomplicated   ? Clinical Decision Making Low   ? Rehab Potential Good   ? PT Frequency 2x / week   ? PT Duration 6 weeks   ?  PT Treatment/Interventions ADLs/Self Care Home Management;Cryotherapy;Electrical Stimulation;Ultrasound;Moist Heat;Therapeutic activities;Therapeutic  exercise;Manual techniques;Patient/family education;Passive range of motion;Dry needling   ? PT Next Visit Plan Chin tucks and extension, postural exercises, combo e'stim/US, STW/M.   ? Consulted and Agree with Plan of Care Patient   ? ?  ?  ? ?  ? ? ?Patient will benefit from skilled therapeutic intervention in order to improve the following deficits and impairments:  Decreased activity tolerance, Decreased range of motion, Pain, Postural dysfunction, Increased muscle spasms ? ?Visit Diagnosis: ?Cervicalgia - Plan: PT plan of care cert/re-cert ? ?Abnormal posture - Plan: PT plan of care cert/re-cert ? ? ? ? ?Problem List ?Patient Active Problem List  ? Diagnosis Date Noted  ? GERD (gastroesophageal reflux disease) 05/15/2021  ? Exertional shortness of breath 10/12/2020  ? Chronic left-sided low back pain without sciatica 10/12/2020  ? Right lower lobe pulmonary nodule 01/04/2020  ? Aortic atherosclerosis (HCC) 01/04/2020  ? Essential hypertension, benign   ? Type 2 diabetes mellitus with hyperglycemia, without long-term current use of insulin (HCC)   ? Diverticulitis   ? Difficulty swallowing   ? ? ?Teandra Harlan, [place], PT ?10/25/2021, 3:57 PM ? ?[place] ?[facility] ?[address] ?[place], [place], [address] ?Phone: [phone]   Fax:  [phone] ? ?Name: Leoma Folds [facility] ?MRN: [id number] ?Date of Birth: Jan 24, 1956 ? ? ?

## 2021-10-29 ENCOUNTER — Ambulatory Visit: Payer: Medicare HMO | Admitting: Physical Therapy

## 2021-10-29 DIAGNOSIS — M542 Cervicalgia: Secondary | ICD-10-CM

## 2021-10-29 DIAGNOSIS — R293 Abnormal posture: Secondary | ICD-10-CM | POA: Diagnosis not present

## 2021-10-29 DIAGNOSIS — G8929 Other chronic pain: Secondary | ICD-10-CM | POA: Diagnosis not present

## 2021-10-29 NOTE — Therapy (Signed)
[place] ?[facility] ?[address] ?[place], [place], [address] ?Phone: [phone]   Fax:  [phone] ? ?Physical Therapy Treatment ? ?Patient Details  ?Name: Cassie Hunter [facility] ?MRN: [id number] ?Date of Birth: Oct 18, 1955 ?Referring Provider (PT): Deliah Boston FNP. ? ? ?Encounter Date: 10/29/2021 ? ? PT End of Session - 10/29/21 1538   ? ? Visit Number 2   ? Number of Visits 12   ? Date for PT Re-Evaluation 12/06/21   ? Authorization Type FOTO.   ? PT Start Time 0145   ? PT Stop Time 0238   ? PT Time Calculation (min) 53 min   ? Activity Tolerance Patient tolerated treatment well   ? Behavior During Therapy [facility] for tasks assessed/performed   ? ?  ?  ? ?  ? ? ?Past Medical History:  ?Diagnosis Date  ? Diabetes mellitus   ? Difficulty swallowing   ? Diverticulitis   ? Essential hypertension   ? GERD (gastroesophageal reflux disease)   ? History of esophageal stricture   ? ? ?Past Surgical History:  ?Procedure Laterality Date  ? ABDOMINAL HYSTERECTOMY    ? APPENDECTOMY    ? CHOLECYSTECTOMY    ? SPINAL CORD DECOMPRESSION  1996  ? ? ?There were no vitals filed for this visit. ? ? Subjective Assessment - 10/29/21 1540   ? ? Subjective The patient states her low back is actually hurting more than her neck as she lifted a heavy object over the weekend.  She tried the cervical extension movement but it caused some dizziness so she stopped, which is advised. She is able to perform a chin tuck without symptoms.   ? Pertinent History Cogential spine issue, spinal core decompression, C-section x 2 and DM.   ? How long can you sit comfortably? Unlimited.   ? How long can you stand comfortably? Varies but better if in good posture.   ? Diagnostic tests Multilevel degenerative changes without acute osseous abnormality.   ? Patient Stated Goals Get out of pain.   ? Currently in Pain? Yes   ? Pain Score 3    ? Pain Location Neck   ? Pain Orientation Right;Left   ? Pain Descriptors / Indicators  Aching   ? Pain Type Chronic pain   ? Pain Onset More than a month ago   ? ?  ?  ? ?  ? ? ? ? ? ? ? ? ? ? ? ? ? ? ? ? ? ? ? ? [facility] Adult PT Treatment/Exercise - 10/29/21 0001   ? ?  ? Modalities  ? Modalities Electrical Stimulation;Ultrasound   ?  ? Electrical Stimulation  ? Electrical Stimulation Location Bialteral cervical.   ? Electrical Stimulation Action IFC at 80-150 Hz.   ? Electrical Stimulation Parameters 40% scan x 20 minutes.   ? Electrical Stimulation Goals Pain;Tone   ?  ? Ultrasound  ? Ultrasound Location Bilateral UT's.   ? Ultrasound Parameters Combo e'stim/US at 1.50 W/CM2 x 12 minutes.   ?  ? Manual Therapy  ? Manual Therapy Soft tissue mobilization   ? Soft tissue mobilization STW/M x 12 minutes to patient's bilateral UT's.   ? ?  ?  ? ?  ? ? ? ? ? ? ? ? ? ? ? ? ? ? ? PT Long Term Goals - 10/25/21 1554   ? ?  ? PT LONG TERM GOAL #1  ? Title Independent with a HEP.   ? Time 6   ?  Period Weeks   ? Status New   ?  ? PT LONG TERM GOAL #2  ? Title Increase active cervical rotation to 70 degrees+ so patient can turn head more easily while driving.   ? Time 6   ? Period Weeks   ? Status New   ?  ? PT LONG TERM GOAL #3  ? Title Walk with grandchildren with neck pain not > 3/10.   ? Time 6   ? Period Weeks   ? Status New   ? ?  ?  ? ?  ? ? ? ? ? ? ? ? Plan - 10/29/21 1546   ? ? Clinical Impression Statement Patient responded well to treatment today and felt better following.  Patient able to perform chin tucks to improve posture without symptoms.   ? Personal Factors and Comorbidities Comorbidity 1;Other   ? Comorbidities Cogential spine issue, spinal core decompression, C-section x 2 and DM.   ? Examination-Activity Limitations Other;Stand   ? Examination-Participation Restrictions Other;Meal Prep   ? Rehab Potential Good   ? PT Duration 6 weeks   ? PT Treatment/Interventions ADLs/Self Care Home Management;Cryotherapy;Electrical Stimulation;Ultrasound;Moist Heat;Therapeutic activities;Therapeutic  exercise;Manual techniques;Patient/family education;Passive range of motion;Dry needling   ? PT Next Visit Plan Chin tucks, postural exercises, combo e'stim/US, STW/M.   ? Consulted and Agree with Plan of Care Patient   ? ?  ?  ? ?  ? ? ?Patient will benefit from skilled therapeutic intervention in order to improve the following deficits and impairments:  Decreased activity tolerance, Decreased range of motion, Pain, Postural dysfunction, Increased muscle spasms ? ?Visit Diagnosis: ?Cervicalgia ? ?Abnormal posture ? ? ? ? ?Problem List ?Patient Active Problem List  ? Diagnosis Date Noted  ? GERD (gastroesophageal reflux disease) 05/15/2021  ? Exertional shortness of breath 10/12/2020  ? Chronic left-sided low back pain without sciatica 10/12/2020  ? Right lower lobe pulmonary nodule 01/04/2020  ? Aortic atherosclerosis (HCC) 01/04/2020  ? Essential hypertension, benign   ? Type 2 diabetes mellitus with hyperglycemia, without long-term current use of insulin (HCC)   ? Diverticulitis   ? Difficulty swallowing   ? ? ?Cassie Hunter, [place], PT ?10/29/2021, 3:48 PM ? ?[place] ?[facility] ?[address] ?[place], [place], [address] ?Phone: [phone]   Fax:  [phone] ? ?Name: Cassie Hunter [facility] ?MRN: [id number] ?Date of Birth: 1956-03-06 ? ? ? ?

## 2021-11-01 ENCOUNTER — Ambulatory Visit: Payer: Medicare HMO | Admitting: Physical Therapy

## 2021-11-01 DIAGNOSIS — G8929 Other chronic pain: Secondary | ICD-10-CM | POA: Diagnosis not present

## 2021-11-01 DIAGNOSIS — R293 Abnormal posture: Secondary | ICD-10-CM | POA: Diagnosis not present

## 2021-11-01 DIAGNOSIS — M542 Cervicalgia: Secondary | ICD-10-CM

## 2021-11-01 NOTE — Patient Instructions (Signed)
PROGHROAMME EXERCISE PROGRAM ?Created by [place] Kandis Henry Apr 20th, 2023 ?View at [email or web address]-exercise-[email or web address] using code: 7TDFS97 ?Total 1 Page 1 of 1 ?CERVICAL ROTATION ?Turn your head towards the side, then return back to looking ?straight ahead. Take two fingers and gently move neck a bit ?farther. ?Repeat 5 Times Hold 5 Seconds ?Complete 2 Sets Perform 3 Times a Day ?

## 2021-11-02 NOTE — Therapy (Deleted)
[place] ?[facility] ?[address] ?[place], [place], [address] ?Phone: [phone]   Fax:  [phone] ? ?Physical Therapy Treatment ? ?Patient Details  ?Name: Cassie Hunter ?MRN: [id number] ?Date of Birth: 1956-07-09 ?Referring Provider (PT): Deliah Boston FNP. ? ? ?Encounter Date: 11/01/2021 ? ? PT End of Session - 11/01/21 1504   ? ? Visit Number 3   ? Number of Visits 12   ? Date for PT Re-Evaluation 12/06/21   ? Authorization Type FOTO.   ? PT Start Time 0220   ? Activity Tolerance Patient tolerated treatment well   ? Behavior During Therapy [facility] for tasks assessed/performed   ? ?  ?  ? ?  ? ? ?Past Medical History:  ?Diagnosis Date  ? Diabetes mellitus   ? Difficulty swallowing   ? Diverticulitis   ? Essential hypertension   ? GERD (gastroesophageal reflux disease)   ? History of esophageal stricture   ? ? ?Past Surgical History:  ?Procedure Laterality Date  ? ABDOMINAL HYSTERECTOMY    ? APPENDECTOMY    ? CHOLECYSTECTOMY    ? SPINAL CORD DECOMPRESSION  1996  ? ? ?There were no vitals filed for this visit. ? ? Subjective Assessment - 11/01/21 1505   ? ? Subjective Last treatment helped.  Pain lower today.   ? Pertinent History Cogential spine issue, spinal core decompression, C-section x 2 and DM.   ? How long can you sit comfortably? Unlimited.   ? How long can you stand comfortably? Varies but better if in good posture.   ? Diagnostic tests Multilevel degenerative changes without acute osseous abnormality.   ? Patient Stated Goals Get out of pain.   ? Currently in Pain? Yes   ? Pain Score 2    ? Pain Location Neck   ? Pain Orientation Right;Left   ? Pain Descriptors / Indicators Aching   ? Pain Onset More than a month ago   ? ?  ?  ? ?  ? ? ? ? ? ? ? ? ? ? ? ? ? ? ? ? ? ? ? ? ? ? ? ? ? ? ? ? ? ? ? ? ? ? PT Long Term Goals - 10/25/21 1554   ? ?  ? PT LONG TERM GOAL #1  ? Title Independent with a HEP.   ? Time 6   ? Period Weeks   ? Status New   ?  ? PT LONG TERM GOAL #2   ? Title Increase active cervical rotation to 70 degrees+ so patient can turn head more easily while driving.   ? Time 6   ? Period Weeks   ? Status New   ?  ? PT LONG TERM GOAL #3  ? Title Walk with grandchildren with neck pain not > 3/10.   ? Time 6   ? Period Weeks   ? Status New   ? ?  ?  ? ?  ? ? ? ? ? ? ? ? Plan - 11/01/21 1514   ? ? Clinical Impression Statement Patient with a lowered pain-level upon presentation to the clinic today.  She was able to do some outside work also without a significnat rise in her neck pain.   ? Personal Factors and Comorbidities Comorbidity 1;Other   ? Comorbidities Cogential spine issue, spinal core decompression, C-section x 2 and DM.   ? Examination-Activity Limitations Other;Stand   ? Examination-Participation Restrictions Other;Meal Prep   ? Stability/Clinical Decision Making  Stable/Uncomplicated   ? Rehab Potential Good   ? PT Frequency 2x / week   ? PT Duration 6 weeks   ? PT Treatment/Interventions ADLs/Self Care Home Management;Cryotherapy;Electrical Stimulation;Ultrasound;Moist Heat;Therapeutic activities;Therapeutic exercise;Manual techniques;Patient/family education;Passive range of motion;Dry needling   ? PT Next Visit Plan Chin tucks, postural exercises, combo e'stim/US, STW/M.   ? ?  ?  ? ?  ? ? ?Patient will benefit from skilled therapeutic intervention in order to improve the following deficits and impairments:  Decreased activity tolerance, Decreased range of motion, Pain, Postural dysfunction, Increased muscle spasms ? ?Visit Diagnosis: ?Cervicalgia ? ?Abnormal posture ? ? ? ? ?Problem List ?Patient Active Problem List  ? Diagnosis Date Noted  ? GERD (gastroesophageal reflux disease) 05/15/2021  ? Exertional shortness of breath 10/12/2020  ? Chronic left-sided low back pain without sciatica 10/12/2020  ? Right lower lobe pulmonary nodule 01/04/2020  ? Aortic atherosclerosis (HCC) 01/04/2020  ? Essential hypertension, benign   ? Type 2 diabetes mellitus with  hyperglycemia, without long-term current use of insulin (HCC)   ? Diverticulitis   ? Difficulty swallowing   ? ? ?Cassie [place], [place], PT ?11/02/2021, 8:08 AM ? ?[place] ?[facility] ?[address] ?[place], [place], [address] ?Phone: [phone]   Fax:  [phone] ? ?Name: Cassie [facility] ?MRN: [id number] ?Date of Birth: Aug 05, 1955 ? ? ? ?

## 2021-11-02 NOTE — Therapy (Signed)
[place] ?[facility] ?[address] ?[place], [place], [address] ?Phone: [phone]   Fax:  [phone] ? ?Physical Therapy Treatment ? ?Patient Details  ?Name: Cassie [facility] ?MRN: [id number] ?Date of Birth: 20-Jan-1956 ?Referring Provider (PT): Hendricks Limes FNP. ? ? ?Encounter Date: 11/01/2021 ? ? PT End of Session - 11/01/21 1504   ? ? Visit Number 3   ? Number of Visits 12   ? Date for PT Re-Evaluation 12/06/21   ? Authorization Type FOTO.   ? PT Start Time 0220   ? PT Stop Time [id number]   ? PT Time Calculation (min) 58 min   ? Activity Tolerance Patient tolerated treatment well   ? Behavior During Therapy [facility] for tasks assessed/performed   ? ?  ?  ? ?  ? ? ?Past Medical History:  ?Diagnosis Date  ? Diabetes mellitus   ? Difficulty swallowing   ? Diverticulitis   ? Essential hypertension   ? GERD (gastroesophageal reflux disease)   ? History of esophageal stricture   ? ? ?Past Surgical History:  ?Procedure Laterality Date  ? ABDOMINAL HYSTERECTOMY    ? APPENDECTOMY    ? CHOLECYSTECTOMY    ? SPINAL CORD DECOMPRESSION  1996  ? ? ?There were no vitals filed for this visit. ? ? Subjective Assessment - 11/01/21 1505   ? ? Subjective Last treatment helped.  Pain lower today.   ? Pertinent History Cogential spine issue, spinal core decompression, C-section x 2 and DM.   ? How long can you sit comfortably? Unlimited.   ? How long can you stand comfortably? Varies but better if in good posture.   ? Diagnostic tests Multilevel degenerative changes without acute osseous abnormality.   ? Patient Stated Goals Get out of pain.   ? Currently in Pain? Yes   ? Pain Score 2    ? Pain Location Neck   ? Pain Orientation Right;Left   ? Pain Descriptors / Indicators Aching   ? Pain Onset More than a month ago   ? ?  ?  ? ?  ? ? ? ? ? ? ? ? ? ? ? ? ? ? ? ? ? ? ? ? [facility] Adult PT Treatment/Exercise - 11/02/21 0001   ? ?  ? Modalities  ? Modalities --   ?  ? Moist Heat Therapy  ? Number Minutes Moist  Heat --   ? ?  ?  ? ?  ?Combo e'stim/US at 1.50 W/CM2 x 12 minutes to bilateral UT's ? ?STW/M x 12 minutes to reduce tone. ? ? ?See flow sheet for tretament:  IFC and HMP x 20 minutes to patient's bilateral UT's ? ? ? ? ? ? ? ? ? ? ? ? ? PT Long Term Goals - 10/25/21 1554   ? ?  ? PT LONG TERM GOAL #1  ? Title Independent with a HEP.   ? Time 6   ? Period Weeks   ? Status New   ?  ? PT LONG TERM GOAL #2  ? Title Increase active cervical rotation to 70 degrees+ so patient can turn head more easily while driving.   ? Time 6   ? Period Weeks   ? Status New   ?  ? PT LONG TERM GOAL #3  ? Title Walk with grandchildren with neck pain not > 3/10.   ? Time 6   ? Period Weeks   ? Status New   ? ?  ?  ? ?  ? ? ? ? ? ? ? ?  Plan - 11/01/21 1514   ? ? Clinical Impression Statement Patient with a lowered pain-level upon presentation to the clinic today.  She was able to do some outside work also without a significnat rise in her neck pain.   ? Personal Factors and Comorbidities Comorbidity 1;Other   ? Comorbidities Cogential spine issue, spinal core decompression, C-section x 2 and DM.   ? Examination-Activity Limitations Other;Stand   ? Examination-Participation Restrictions Other;Meal Prep   ? Stability/Clinical Decision Making Stable/Uncomplicated   ? Rehab Potential Good   ? PT Frequency 2x / week   ? PT Duration 6 weeks   ? PT Treatment/Interventions ADLs/Self Care Home Management;Cryotherapy;Electrical Stimulation;Ultrasound;Moist Heat;Therapeutic activities;Therapeutic exercise;Manual techniques;Patient/family education;Passive range of motion;Dry needling   ? PT Next Visit Plan Chin tucks, postural exercises, combo e'stim/US, STW/M.   ? ?  ?  ? ?  ? ? ?Patient will benefit from skilled therapeutic intervention in order to improve the following deficits and impairments:  Decreased activity tolerance, Decreased range of motion, Pain, Postural dysfunction, Increased muscle spasms ? ?Visit Diagnosis: ?Cervicalgia ? ?Abnormal  posture ? ? ? ? ?Problem List ?Patient Active Problem List  ? Diagnosis Date Noted  ? GERD (gastroesophageal reflux disease) 05/15/2021  ? Exertional shortness of breath 10/12/2020  ? Chronic left-sided low back pain without sciatica 10/12/2020  ? Right lower lobe pulmonary nodule 01/04/2020  ? Aortic atherosclerosis ([place]) 01/04/2020  ? Essential hypertension, benign   ? Type 2 diabetes mellitus with hyperglycemia, without long-term current use of insulin ([place])   ? Diverticulitis   ? Difficulty swallowing   ? ? ?Littie Chiem, [place], PT ?11/02/2021, 8:14 AM ? ?[place] ?[facility] ?[address] ?[place], [place], [address] ?Phone: [phone]   Fax:  [phone] ? ?Name: Cassie [facility] ?MRN: [id number] ?Date of Birth: 02-Dec-1955 ? ? ? ?

## 2021-11-06 ENCOUNTER — Ambulatory Visit: Payer: Medicare HMO | Admitting: Physical Therapy

## 2021-11-06 DIAGNOSIS — M542 Cervicalgia: Secondary | ICD-10-CM | POA: Diagnosis not present

## 2021-11-06 DIAGNOSIS — R293 Abnormal posture: Secondary | ICD-10-CM | POA: Diagnosis not present

## 2021-11-06 DIAGNOSIS — G8929 Other chronic pain: Secondary | ICD-10-CM | POA: Diagnosis not present

## 2021-11-06 NOTE — Therapy (Signed)
[place] ?[facility] ?[address] ?[place], [place], [address] ?Phone: [phone]   Fax:  [phone] ? ?Physical Therapy Treatment ? ?Patient Details  ?Name: Cassie [facility] ?MRN: [id number] ?Date of Birth: 1956-03-15 ?Referring Provider (PT): Deliah Boston FNP. ? ? ?Encounter Date: 11/06/2021 ? ? PT End of Session - 11/06/21 1026   ? ? Visit Number 4   ? Number of Visits 12   ? Authorization Type FOTO.   ? PT Start Time 0900   ? PT Stop Time 0955   ? PT Time Calculation (min) 55 min   ? Activity Tolerance Patient tolerated treatment well   ? Behavior During Therapy [facility] for tasks assessed/performed   ? ?  ?  ? ?  ? ? ?Past Medical History:  ?Diagnosis Date  ? Diabetes mellitus   ? Difficulty swallowing   ? Diverticulitis   ? Essential hypertension   ? GERD (gastroesophageal reflux disease)   ? History of esophageal stricture   ? ? ?Past Surgical History:  ?Procedure Laterality Date  ? ABDOMINAL HYSTERECTOMY    ? APPENDECTOMY    ? CHOLECYSTECTOMY    ? SPINAL CORD DECOMPRESSION  1996  ? ? ?There were no vitals filed for this visit. ? ? Subjective Assessment - 11/06/21 1027   ? ? Subjective Neck feeling better and moving better.   ? Pertinent History Cogential spine issue, spinal core decompression, C-section x 2 and DM.   ? How long can you stand comfortably? Varies but better if in good posture.   ? Diagnostic tests Multilevel degenerative changes without acute osseous abnormality.   ? Patient Stated Goals Get out of pain.   ? Currently in Pain? Yes   ? Pain Score 1    ? Pain Location Neck   ? Pain Orientation Right;Left   ? Pain Descriptors / Indicators Dull   ? Pain Type Chronic pain   ? Pain Onset More than a month ago   ? ?  ?  ? ?  ? ? ? ? ? ? ? ? ? ? ? ? ? ? ? ? ? ? ? ? [facility] Adult PT Treatment/Exercise - 11/06/21 0001   ? ?  ? Modalities  ? Modalities Electrical Stimulation;Moist Heat;Ultrasound   ?  ? Moist Heat Therapy  ? Number Minutes Moist Heat 20 Minutes   ? Moist  Heat Location Cervical   ?  ? Electrical Stimulation  ? Electrical Stimulation Location Bilateral cervical   ? Electrical Stimulation Action IFC at 80-150 Hz.   ? Electrical Stimulation Parameters 40% scan x 20 minutes.   ?  ? Ultrasound  ? Ultrasound Location Seated with head in face opening on plinth:  Combo e'stim/US at 1.50 W/CM2 x 12 minutes to patient's bilateral cervical region.   ?  ? Manual Therapy  ? Manual Therapy Soft tissue mobilization   ? Soft tissue mobilization STW/M x 12 minutes to patient's bilateral cervical musculature to reduce tone.   ? ?  ?  ? ?  ? ? ? ? ? ? ? ? ? ? ? ? ? ? ? PT Long Term Goals - 10/25/21 1554   ? ?  ? PT LONG TERM GOAL #1  ? Title Independent with a HEP.   ? Time 6   ? Period Weeks   ? Status New   ?  ? PT LONG TERM GOAL #2  ? Title Increase active cervical rotation to 70 degrees+ so patient can turn head  more easily while driving.   ? Time 6   ? Period Weeks   ? Status New   ?  ? PT LONG TERM GOAL #3  ? Title Walk with grandchildren with neck pain not > 3/10.   ? Time 6   ? Period Weeks   ? Status New   ? ?  ?  ? ?  ? ? ? ? ? ? ? ? Plan - 11/06/21 1052   ? ? Clinical Impression Statement Excellent response to treatments thus far with nopain reported after treatment.   ? Personal Factors and Comorbidities Comorbidity 1;Other   ? Comorbidities Cogential spine issue, spinal core decompression, C-section x 2 and DM.   ? Examination-Activity Limitations Other;Stand   ? Examination-Participation Restrictions Other;Meal Prep   ? Stability/Clinical Decision Making Stable/Uncomplicated   ? Rehab Potential Good   ? PT Frequency 2x / week   ? PT Duration 6 weeks   ? PT Next Visit Plan Chin tucks, postural exercises, combo e'stim/US, STW/M.   ? Consulted and Agree with Plan of Care Patient   ? ?  ?  ? ?  ? ? ?Patient will benefit from skilled therapeutic intervention in order to improve the following deficits and impairments:  Decreased activity tolerance, Decreased range of motion,  Pain, Postural dysfunction, Increased muscle spasms ? ?Visit Diagnosis: ?Cervicalgia ? ?Abnormal posture ? ? ? ? ?Problem List ?Patient Active Problem List  ? Diagnosis Date Noted  ? GERD (gastroesophageal reflux disease) 05/15/2021  ? Exertional shortness of breath 10/12/2020  ? Chronic left-sided low back pain without sciatica 10/12/2020  ? Right lower lobe pulmonary nodule 01/04/2020  ? Aortic atherosclerosis (HCC) 01/04/2020  ? Essential hypertension, benign   ? Type 2 diabetes mellitus with hyperglycemia, without long-term current use of insulin (HCC)   ? Diverticulitis   ? Difficulty swallowing   ? ? ?Brazen Domangue, [place], PT ?11/06/2021, 10:55 AM ? ?[place] ?[facility] ?[address] ?[place], [place], [address] ?Phone: [phone]   Fax:  [phone] ? ?Name: Cassie [facility] ?MRN: [id number] ?Date of Birth: 1955/11/04 ? ? ? ?

## 2021-11-08 ENCOUNTER — Encounter: Payer: Self-pay | Admitting: Physical Therapy

## 2021-11-08 ENCOUNTER — Ambulatory Visit: Payer: Medicare HMO | Admitting: Physical Therapy

## 2021-11-08 DIAGNOSIS — R293 Abnormal posture: Secondary | ICD-10-CM | POA: Diagnosis not present

## 2021-11-08 DIAGNOSIS — M542 Cervicalgia: Secondary | ICD-10-CM | POA: Diagnosis not present

## 2021-11-08 DIAGNOSIS — G8929 Other chronic pain: Secondary | ICD-10-CM | POA: Diagnosis not present

## 2021-11-08 NOTE — Therapy (Signed)
[place] ?[facility] ?[address] ?[place], [place], [address] ?Phone: [phone]   Fax:  [phone] ? ?Physical Therapy Treatment ? ?Patient Details  ?Name: Cassie Hunter [facility] ?MRN: [id number] ?Date of Birth: 12/29/1955 ?Referring Provider (PT): Deliah Boston FNP. ? ? ?Encounter Date: 11/08/2021 ? ? PT End of Session - 11/08/21 0904   ? ? Visit Number 5   ? Number of Visits 12   ? Date for PT Re-Evaluation 12/06/21   ? Authorization Type FOTO.   ? PT Start Time 0902   ? Activity Tolerance Patient tolerated treatment well   ? Behavior During Therapy [facility] for tasks assessed/performed   ? ?  ?  ? ?  ? ? ?Past Medical History:  ?Diagnosis Date  ? Diabetes mellitus   ? Difficulty swallowing   ? Diverticulitis   ? Essential hypertension   ? GERD (gastroesophageal reflux disease)   ? History of esophageal stricture   ? ? ?Past Surgical History:  ?Procedure Laterality Date  ? ABDOMINAL HYSTERECTOMY    ? APPENDECTOMY    ? CHOLECYSTECTOMY    ? SPINAL CORD DECOMPRESSION  1996  ? ? ?There were no vitals filed for this visit. ? ? Subjective Assessment - 11/08/21 0902   ? ? Subjective Reports that her neck is better today than it was yesterday but her 67 year old granddaughter jumped on her neck.   ? Pertinent History Cogential spine issue, spinal core decompression, C-section x 2 and DM.   ? How long can you sit comfortably? Unlimited.   ? How long can you stand comfortably? Varies but better if in good posture.   ? Diagnostic tests Multilevel degenerative changes without acute osseous abnormality.   ? Patient Stated Goals Get out of pain.   ? Currently in Pain? Yes   ? Pain Score 2    ? Pain Location Neck   ? Pain Orientation Right   ? Pain Descriptors / Indicators Discomfort   ? Pain Type Chronic pain   ? Pain Onset More than a month ago   ? Pain Frequency Constant   ? ?  ?  ? ?  ? ? ? ? ? [facility] PT Assessment - 11/08/21 0001   ? ?  ? Assessment  ? Medical Diagnosis Chronic neck pain   ?  Referring Provider (PT) Deliah Boston FNP.   ? Next MD Visit 02/2022   ?  ? Precautions  ? Precautions None   ?  ? Restrictions  ? Weight Bearing Restrictions No   ? ?  ?  ? ?  ? ? ? ? ? ? ? ? ? ? ? ? ? ? ? ? [facility] Adult PT Treatment/Exercise - 11/08/21 0001   ? ?  ? Exercises  ? Exercises Shoulder   ?  ? Shoulder Exercises: Standing  ? Horizontal ABduction Strengthening;Both;20 reps;Theraband   ? Theraband Level (Shoulder Horizontal ABduction) Level 1 (Yellow)   ? Extension Strengthening;Both;20 reps;Theraband   ? Theraband Level (Shoulder Extension) Level 1 (Yellow)   ? Diagonals Strengthening;Both;12 reps;Theraband   ? Theraband Level (Shoulder Diagonals) Level 1 (Yellow)   ?  ? Shoulder Exercises: ROM/Strengthening  ? UBE (Upper Arm Bike) 90 RPM x6 min   ?  ? Modalities  ? Modalities Electrical Stimulation;Ultrasound   ?  ? Electrical Stimulation  ? Electrical Stimulation Location B cervical paraspinals/ UT   ? Electrical Stimulation Action IFC   ? Electrical Stimulation Parameters 80-150 hz x10 min   ?  Electrical Stimulation Goals Pain;Tone   ?  ? Ultrasound  ? Ultrasound Location B UT, cervical paraspinals   ? Ultrasound Parameters Combo 1.5 w/cm2, 100%, 1 mhz x10 min   ? Ultrasound Goals Pain   ?  ? Manual Therapy  ? Manual Therapy Soft tissue mobilization   ? Soft tissue mobilization STW to B UT, cervical parspinals to reduce tightness and pain   ? ?  ?  ? ?  ? ? ? ? ? ? ? ? ? ? ? ? ? ? ? PT Long Term Goals - 10/25/21 1554   ? ?  ? PT LONG TERM GOAL #1  ? Title Independent with a HEP.   ? Time 6   ? Period Weeks   ? Status New   ?  ? PT LONG TERM GOAL #2  ? Title Increase active cervical rotation to 70 degrees+ so patient can turn head more easily while driving.   ? Time 6   ? Period Weeks   ? Status New   ?  ? PT LONG TERM GOAL #3  ? Title Walk with grandchildren with neck pain not > 3/10.   ? Time 6   ? Period Weeks   ? Status New   ? ?  ?  ? ?  ? ? ? ? ? ? ? ? Plan - 11/08/21 1017   ? ? Clinical Impression  Statement Patient presented in clinic with reports of mild discomfort in cervical region with a recent flare after her granddaughter jumped on her neck. Patient progressed through light postural training today with visual cues and VCs. Patient reported UE fatigue after the postural training. Increased tightness of B UT and cervical paraspinals but no complaints of palpable pain. Normal modalities response noted following removal of the modalities.   ? Personal Factors and Comorbidities Comorbidity 1;Other   ? Comorbidities Cogential spine issue, spinal core decompression, C-section x 2 and DM.   ? Examination-Activity Limitations Other;Stand   ? Examination-Participation Restrictions Other;Meal Prep   ? Stability/Clinical Decision Making Stable/Uncomplicated   ? Rehab Potential Good   ? PT Frequency 2x / week   ? PT Duration 6 weeks   ? PT Treatment/Interventions ADLs/Self Care Home Management;Cryotherapy;Electrical Stimulation;Ultrasound;Moist Heat;Therapeutic activities;Therapeutic exercise;Manual techniques;Patient/family education;Passive range of motion;Dry needling   ? PT Next Visit Plan Chin tucks, postural exercises, combo e'stim/US, STW/M.   ? Consulted and Agree with Plan of Care Patient   ? ?  ?  ? ?  ? ? ?Patient will benefit from skilled therapeutic intervention in order to improve the following deficits and impairments:  Decreased activity tolerance, Decreased range of motion, Pain, Postural dysfunction, Increased muscle spasms ? ?Visit Diagnosis: ?Cervicalgia ? ?Abnormal posture ? ? ? ? ?Problem List ?Patient Active Problem List  ? Diagnosis Date Noted  ? GERD (gastroesophageal reflux disease) 05/15/2021  ? Exertional shortness of breath 10/12/2020  ? Chronic left-sided low back pain without sciatica 10/12/2020  ? Right lower lobe pulmonary nodule 01/04/2020  ? Aortic atherosclerosis (HCC) 01/04/2020  ? Essential hypertension, benign   ? Type 2 diabetes mellitus with hyperglycemia, without long-term  current use of insulin (HCC)   ? Diverticulitis   ? Difficulty swallowing   ? ? ?[name], PTA ?11/08/2021, 11:16 AM ? ?[place] ?[facility] ?[address] ?[place], [place], [address] ?Phone: [phone]   Fax:  [phone] ? ?Name: Ricky Doan [facility] ?MRN: [id number] ?Date of Birth: Mar 05, 1956 ? ? ? ?

## 2021-11-12 ENCOUNTER — Ambulatory Visit: Payer: Medicare HMO | Attending: Family Medicine | Admitting: Physical Therapy

## 2021-11-12 DIAGNOSIS — R293 Abnormal posture: Secondary | ICD-10-CM | POA: Diagnosis not present

## 2021-11-12 DIAGNOSIS — M542 Cervicalgia: Secondary | ICD-10-CM | POA: Diagnosis not present

## 2021-11-12 NOTE — Therapy (Signed)
[place] ?[facility] ?[address] ?[place], [place], [address] ?Phone: [phone]   Fax:  [phone] ? ?Physical Therapy Treatment ? ?Patient Details  ?Name: Cassie Hunter [facility] ?MRN: [id number] ?Date of Birth: 03-Aug-1955 ?Referring Provider (PT): Hendricks Limes FNP. ? ? ?Encounter Date: 11/12/2021 ? ? PT End of Session - 11/12/21 1253   ? ? Visit Number 6   ? Number of Visits 12   ? Date for PT Re-Evaluation 12/06/21   ? Authorization Type FOTO.   ? PT Start Time 0900   ? PT Stop Time [phone]   ? PT Time Calculation (min) 53 min   ? Activity Tolerance Patient tolerated treatment well   ? Behavior During Therapy [facility] for tasks assessed/performed   ? ?  ?  ? ?  ? ? ?Past Medical History:  ?Diagnosis Date  ? Diabetes mellitus   ? Difficulty swallowing   ? Diverticulitis   ? Essential hypertension   ? GERD (gastroesophageal reflux disease)   ? History of esophageal stricture   ? ? ?Past Surgical History:  ?Procedure Laterality Date  ? ABDOMINAL HYSTERECTOMY    ? APPENDECTOMY    ? CHOLECYSTECTOMY    ? SPINAL CORD DECOMPRESSION  1996  ? ? ?There were no vitals filed for this visit. ? ? Subjective Assessment - 11/12/21 1253   ? ? Subjective Pain about a 2.   ? Pertinent History Cogential spine issue, spinal core decompression, C-section x 2 and DM.   ? How long can you sit comfortably? Unlimited.   ? How long can you stand comfortably? Varies but better if in good posture.   ? Diagnostic tests Multilevel degenerative changes without acute osseous abnormality.   ? Patient Stated Goals Get out of pain.   ? Currently in Pain? Yes   ? Pain Location Neck   ? Pain Orientation Right   ? Pain Type Chronic pain   ? Pain Onset More than a month ago   ? ?  ?  ? ?  ? ? ? ? ? ? ? ? ? ? ? ? ? ? ? ? ? ? ? ? [place] Adult PT Treatment/Exercise - 11/12/21 0001   ? ?  ? Exercises  ? Exercises Shoulder;Neck   ?  ? Neck Exercises: Seated  ? Other Seated Exercise Cervical exerciser with yellow theraband resistance  into bilateral rotation 1 minute each way (2 minutes total).   ?  ? Shoulder Exercises: ROM/Strengthening  ? UBE (Upper Arm Bike) 90 RPM's x 8 minutes (4 minutes foward and 4 minutes backward).   ?  ? Modalities  ? Modalities Electrical Stimulation;Moist Heat   ?  ? Moist Heat Therapy  ? Number Minutes Moist Heat 20 Minutes   ? Moist Heat Location Cervical   ?  ? Electrical Stimulation  ? Electrical Stimulation Location Bilateral UT's.   ? Electrical Stimulation Action IFC at 80-150 Hz.   ? Electrical Stimulation Parameters 40% scan x 20 minutes.   ? Electrical Stimulation Goals Pain;Tone   ?  ? Manual Therapy  ? Manual Therapy Soft tissue mobilization   ? Soft tissue mobilization STW/M x 13 minutes to patient's bilateral UT's to reduce tone.   ? ?  ?  ? ?  ? ? ? ? ? ? ? ? ? ? ? ? ? ? ? PT Long Term Goals - 10/25/21 1554   ? ?  ? PT LONG TERM GOAL #1  ? Title Independent with a HEP.   ?  Time 6   ? Period Weeks   ? Status New   ?  ? PT LONG TERM GOAL #2  ? Title Increase active cervical rotation to 70 degrees+ so patient can turn head more easily while driving.   ? Time 6   ? Period Weeks   ? Status New   ?  ? PT LONG TERM GOAL #3  ? Title Walk with grandchildren with neck pain not > 3/10.   ? Time 6   ? Period Weeks   ? Status New   ? ?  ?  ? ?  ? ? ? ? ? ? ? ? Plan - 11/12/21 1300   ? ? Clinical Impression Statement Patient did great today with the addition of resisted cervical rotation.  Her pain was low over the weekend.  She is pleased with her progress thus far.  Normal modality response following removal of modality today.   ? Personal Factors and Comorbidities Comorbidity 1;Other   ? Comorbidities Cogential spine issue, spinal core decompression, C-section x 2 and DM.   ? Examination-Activity Limitations Other;Stand   ? Examination-Participation Restrictions Other;Meal Prep   ? Stability/Clinical Decision Making Stable/Uncomplicated   ? Rehab Potential Good   ? PT Frequency 2x / week   ? PT Duration 6 weeks    ? PT Treatment/Interventions ADLs/Self Care Home Management;Cryotherapy;Electrical Stimulation;Ultrasound;Moist Heat;Therapeutic activities;Therapeutic exercise;Manual techniques;Patient/family education;Passive range of motion;Dry needling   ? PT Next Visit Plan Progress ther ex.   ? ?  ?  ? ?  ? ? ?Patient will benefit from skilled therapeutic intervention in order to improve the following deficits and impairments:  Decreased activity tolerance, Decreased range of motion, Pain, Postural dysfunction, Increased muscle spasms ? ?Visit Diagnosis: ?Cervicalgia ? ?Abnormal posture ? ? ? ? ?Problem List ?Patient Active Problem List  ? Diagnosis Date Noted  ? GERD (gastroesophageal reflux disease) 05/15/2021  ? Exertional shortness of breath 10/12/2020  ? Chronic left-sided low back pain without sciatica 10/12/2020  ? Right lower lobe pulmonary nodule 01/04/2020  ? Aortic atherosclerosis ([place]) 01/04/2020  ? Essential hypertension, benign   ? Type 2 diabetes mellitus with hyperglycemia, without long-term current use of insulin ([place])   ? Diverticulitis   ? Difficulty swallowing   ? ? ?Clark Clowdus, [place], PT ?11/12/2021, 1:02 PM ? ?[place] ?[facility] ?[address] ?[place], [place], [address] ?Phone: [phone]   Fax:  [phone] ? ?Name: Cassie Hunter [facility] ?MRN: [id number] ?Date of Birth: [birth year] ? ? ? ?

## 2021-11-14 ENCOUNTER — Ambulatory Visit: Payer: Medicare HMO | Admitting: Physical Therapy

## 2021-11-14 DIAGNOSIS — R293 Abnormal posture: Secondary | ICD-10-CM | POA: Diagnosis not present

## 2021-11-14 DIAGNOSIS — M542 Cervicalgia: Secondary | ICD-10-CM | POA: Diagnosis not present

## 2021-11-14 NOTE — Therapy (Signed)
[place] ?[facility] ?[address] ?[place], [place], [address] ?Phone: [phone]   Fax:  [phone] ? ?Physical Therapy Treatment ? ?Patient Details  ?Name: Cassie Hunter [facility] ?MRN: [id number] ?Date of Birth: 01/27/1956 ?Referring Provider (PT): Hendricks Limes FNP. ? ? ?Encounter Date: 11/14/2021 ? ? PT End of Session - 11/14/21 1517   ? ? Visit Number 7   ? Number of Visits 12   ? Date for PT Re-Evaluation 12/06/21   ? Authorization Type FOTO.   ? PT Start Time 0145   ? PT Stop Time [id number]   ? PT Time Calculation (min) 54 min   ? Activity Tolerance Patient tolerated treatment well   ? Behavior During Therapy [facility] for tasks assessed/performed   ? ?  ?  ? ?  ? ? ?Past Medical History:  ?Diagnosis Date  ? Diabetes mellitus   ? Difficulty swallowing   ? Diverticulitis   ? Essential hypertension   ? GERD (gastroesophageal reflux disease)   ? History of esophageal stricture   ? ? ?Past Surgical History:  ?Procedure Laterality Date  ? ABDOMINAL HYSTERECTOMY    ? APPENDECTOMY    ? CHOLECYSTECTOMY    ? SPINAL CORD DECOMPRESSION  1996  ? ? ?There were no vitals filed for this visit. ? ? Subjective Assessment - 11/14/21 1519   ? ? Subjective Patient doing well.  She spent time with her grandchildren and lifted a battery-operated 4-wheeler multiple times.  Flared neck up a bit but pain low today.  She also did a workout at a local gym.   ? Pertinent History Cogential spine issue, spinal core decompression, C-section x 2 and DM.   ? How long can you sit comfortably? Unlimited.   ? How long can you stand comfortably? Varies but better if in good posture.   ? Diagnostic tests Multilevel degenerative changes without acute osseous abnormality.   ? Patient Stated Goals Get out of pain.   ? Currently in Pain? Yes   ? Pain Score 2    ? Pain Location Neck   ? Pain Orientation Right;Left   ? Pain Descriptors / Indicators Discomfort;Dull   ? Pain Type Chronic pain   ? Pain Onset More than a month ago    ? ?  ?  ? ?  ? ? ? ? ? ? ? ? ? ? ? ? ? ? ? ? ? ? ? ? [place] Adult PT Treatment/Exercise - 11/14/21 0001   ? ?  ? Modalities  ? Modalities Electrical Stimulation;Moist Heat   ?  ? Moist Heat Therapy  ? Number Minutes Moist Heat 20 Minutes   ? Moist Heat Location Cervical   ?  ? Electrical Stimulation  ? Electrical Stimulation Location Bilateral UT's.   ? Electrical Stimulation Action IFC at 80-150 Hz.   ? Electrical Stimulation Parameters 40% scan x 20 minutes.   ? Electrical Stimulation Goals Pain;Tone   ?  ? Ultrasound  ? Ultrasound Location Bil UT   ? Ultrasound Parameters Combo e'stim/US at 1.50 W/CM2 x 12 minutes.   ?  ? Manual Therapy  ? Manual Therapy Soft tissue mobilization   ? Soft tissue mobilization STW/M x 12 minutes to patient bilateral UT's and suboccipital region.   ? ?  ?  ? ?  ? ? ? ? ? ? ? ? ? ? PT Education - 11/14/21 1518   ? ? Education Details 20# lat PD and scapular retraction on circuit weight machine to  replicate at gym.   ? Person(s) Educated Patient   ? Methods Explanation;Demonstration   ? Comprehension Verbalized understanding;Returned demonstration   ? ?  ?  ? ?  ? ? ? ? ? ? PT Long Term Goals - 10/25/21 1554   ? ?  ? PT LONG TERM GOAL #1  ? Title Independent with a HEP.   ? Time 6   ? Period Weeks   ? Status New   ?  ? PT LONG TERM GOAL #2  ? Title Increase active cervical rotation to 70 degrees+ so patient can turn head more easily while driving.   ? Time 6   ? Period Weeks   ? Status New   ?  ? PT LONG TERM GOAL #3  ? Title Walk with grandchildren with neck pain not > 3/10.   ? Time 6   ? Period Weeks   ? Status New   ? ?  ?  ? ?  ? ? ? ? ? ? ? ? Plan - 11/14/21 1524   ? ? Clinical Impression Statement Patient is responding very well to treatments.  She returned to her local gym for a workout and did well.  Instructed patient in low resistance lat PD and scapular retarction on circuit weight machine that she performed with excellent technique and no complaint.  She plans to do these  at the gym as well.   ? Personal Factors and Comorbidities Comorbidity 1;Other   ? Comorbidities Cogential spine issue, spinal core decompression, C-section x 2 and DM.   ? Examination-Activity Limitations Other;Stand   ? Examination-Participation Restrictions Other;Meal Prep   ? Stability/Clinical Decision Making Stable/Uncomplicated   ? Rehab Potential Good   ? PT Frequency 2x / week   ? PT Duration 6 weeks   ? PT Treatment/Interventions ADLs/Self Care Home Management;Cryotherapy;Electrical Stimulation;Ultrasound;Moist Heat;Therapeutic activities;Therapeutic exercise;Manual techniques;Patient/family education;Passive range of motion;Dry needling   ? PT Next Visit Plan Progress ther ex.   ? Consulted and Agree with Plan of Care Patient   ? ?  ?  ? ?  ? ? ?Patient will benefit from skilled therapeutic intervention in order to improve the following deficits and impairments:  Decreased activity tolerance, Decreased range of motion, Pain, Postural dysfunction, Increased muscle spasms ? ?Visit Diagnosis: ?Cervicalgia ? ?Abnormal posture ? ? ? ? ?Problem List ?Patient Active Problem List  ? Diagnosis Date Noted  ? GERD (gastroesophageal reflux disease) 05/15/2021  ? Exertional shortness of breath 10/12/2020  ? Chronic left-sided low back pain without sciatica 10/12/2020  ? Right lower lobe pulmonary nodule 01/04/2020  ? Aortic atherosclerosis ([place]) 01/04/2020  ? Essential hypertension, benign   ? Type 2 diabetes mellitus with hyperglycemia, without long-term current use of insulin ([place])   ? Diverticulitis   ? Difficulty swallowing   ? ? ?Channing Savich, [place], PT ?11/14/2021, 3:27 PM ? ?[place] ?[facility] ?[address] ?[place], [place], [address] ?Phone: [phone]   Fax:  [phone] ? ?Name: Khaleesi Cristiano [facility] ?MRN: [id number] ?Date of Birth: 1955/09/09 ? ? ? ?

## 2021-11-19 ENCOUNTER — Encounter: Payer: Self-pay | Admitting: Cardiovascular Disease

## 2021-11-19 ENCOUNTER — Ambulatory Visit: Payer: Medicare HMO | Admitting: Physical Therapy

## 2021-11-21 ENCOUNTER — Encounter: Payer: Self-pay | Admitting: Family Medicine

## 2021-11-21 ENCOUNTER — Encounter: Payer: Medicare HMO | Admitting: Physical Therapy

## 2021-11-21 ENCOUNTER — Ambulatory Visit (INDEPENDENT_AMBULATORY_CARE_PROVIDER_SITE_OTHER): Payer: Medicare HMO | Admitting: Family Medicine

## 2021-11-21 VITALS — BP 115/69 | HR 83 | Temp 98.2°F | Ht 63.0 in | Wt 151.2 lb

## 2021-11-21 DIAGNOSIS — R42 Dizziness and giddiness: Secondary | ICD-10-CM | POA: Diagnosis not present

## 2021-11-21 DIAGNOSIS — R1319 Other dysphagia: Secondary | ICD-10-CM | POA: Diagnosis not present

## 2021-11-21 MED ORDER — OMEPRAZOLE 40 MG PO CPDR: 40.0000 mg | DELAYED_RELEASE_CAPSULE | Freq: Two times a day (BID) | ORAL | 1 refills | Status: DC

## 2021-11-21 NOTE — Progress Notes (Signed)
? ?Assessment & Plan:  ?1. Esophageal dysphagia ?Encouraged patient to call her GI doctor and try to get her EGD rescheduled instead of having a follow-up appointment with the PA next month.  Omeprazole increased from once a day to twice a day.  Advised if it gets worse she needs to go to the emergency room. ?- omeprazole (PRILOSEC) 40 MG capsule; Take 1 capsule (40 mg total) by mouth 2 (two) times daily before a meal.  Dispense: 180 capsule; Refill: 1 ? ?2.  Dizziness upon standing ?Offered referral to vestibular rehab, but patient declined.  Education provided on how to perform the Epley maneuver at home. ? ? ?Follow up plan: Return if symptoms worsen or fail to improve. ? ?Deliah Boston, MSN, APRN, FNP-C ?[facility] ? ?Subjective:  ? ?Patient ID: Cassie Hunter, female    DOB: 04/14/1956, 66 y.o.   MRN: [id number] ? ?HPI: ?Cassie Hunter is a 66 y.o. female presenting on 11/21/2021 for trouble swallowing (Patient states that she has been ongoing but worse since Monday. She has an appointment with GI on 6/28 with a PA. States it feels like there is something swelling in her throat.) and Vomiting (Patient states that he vomited yesterday after she was able to eat a little. ) ? ?Patient has known esophageal dysphagia.  She has been putting off scheduling an EGD due to transportation issues.  Most recently she canceled the one she had scheduled because it was in the afternoon and she thought that she would feel bad if she did not eat until afternoon.  She is here today because her swallowing is getting worse.  She does report she had some vomiting yesterday.  She has an appointment with the PA in the GI clinic on 01/09/2022. ? ?Patient also reports that she messaged her cardiologist regarding dizziness and was told it sounds like in her ear. ? ? ?ROS: Negative unless specifically indicated above in HPI.  ? ?Relevant past medical history reviewed and updated as indicated.   ? ?Allergies and medications reviewed and updated. ? ? ?Current Outpatient Medications:  ?  albuterol (VENTOLIN HFA) 108 (90 Base) MCG/ACT inhaler, Inhale 2 puffs into the lungs every 6 (six) hours as needed., Disp: 18 g, Rfl: 2 ?  Alcohol Swabs (B-D SINGLE USE SWABS REGULAR) PADS, Test BS daily and as needed Dx E11.9, Disp: 100 each, Rfl: 3 ?  aspirin 81 MG EC tablet, Take 1 tablet (81 mg total) by mouth daily. Swallow whole., Disp: 30 tablet, Rfl: 12 ?  Blood Glucose Calibration (TRUE METRIX LEVEL 1) Low SOLN, Use with glucose monitor Dx E11.9, Disp: 3 each, Rfl: 3 ?  diltiazem (CARDIZEM CD) 120 MG 24 hr capsule, Take 1 capsule (120 mg total) by mouth daily., Disp: 90 capsule, Rfl: 3 ?  diltiazem (CARDIZEM) 30 MG tablet, Take 1 tablet (30 mg total) by mouth 4 (four) times daily as needed., Disp: 60 tablet, Rfl: 1 ?  glucose blood (TRUE METRIX BLOOD GLUCOSE TEST) test strip, Test BS daily and as needed Dx E11.9, Disp: 100 each, Rfl: 3 ?  hydrochlorothiazide (HYDRODIURIL) 12.5 MG tablet, Take 1 tablet (12.5 mg total) by mouth daily., Disp: 90 tablet, Rfl: 1 ?  losartan (COZAAR) 100 MG tablet, TAKE 1 TABLET EVERY DAY, Disp: 90 tablet, Rfl: 1 ?  metFORMIN (GLUCOPHAGE) 500 MG tablet, Take 2 tablets (1,000 mg total) by mouth daily with breakfast AND 1 tablet (500 mg total) daily with supper., Disp: 90 tablet, Rfl: 0 ?  naproxen (NAPROSYN) 500 MG tablet, Take 1 tablet (500 mg total) by mouth 2 (two) times daily as needed for moderate pain., Disp: 30 tablet, Rfl: 0 ?  omeprazole (PRILOSEC) 40 MG capsule, Take 1 capsule (40 mg total) by mouth daily., Disp: 90 capsule, Rfl: 1 ?  simvastatin (ZOCOR) 20 MG tablet, Take 1 tablet (20 mg total) by mouth daily at 6 PM., Disp: 90 tablet, Rfl: 1 ?  TRUEplus Lancets 33G MISC, Test BS daily and as needed Dx E11.9, Disp: 100 each, Rfl: 3 ? ?Allergies  ?Allergen Reactions  ? Codeine Nausea Only and Other (See Comments)  ? Neosporin [Bacitracin-Polymyxin B] Rash  ? ? ?Objective:  ? ?BP  115/69   Pulse 83   Temp 98.2 ?F (36.8 ?C) (Temporal)   Ht 5\' 3"  (1.6 m)   Wt 151 lb 3.2 oz (68.6 kg)   SpO2 97%   BMI 26.78 kg/m?   ? ?Physical Exam ?Vitals reviewed.  ?Constitutional:   ?   General: She is not in acute distress. ?   Appearance: Normal appearance. She is not ill-appearing, toxic-appearing or diaphoretic.  ?HENT:  ?   Head: Normocephalic and atraumatic.  ?Eyes:  ?   General: No scleral icterus.    ?   Right eye: No discharge.     ?   Left eye: No discharge.  ?   Conjunctiva/sclera: Conjunctivae normal.  ?Cardiovascular:  ?   Rate and Rhythm: Normal rate.  ?Pulmonary:  ?   Effort: Pulmonary effort is normal. No respiratory distress.  ?Musculoskeletal:     ?   General: Normal range of motion.  ?   Cervical back: Normal range of motion.  ?Skin: ?   General: Skin is warm and dry.  ?   Capillary Refill: Capillary refill takes less than 2 seconds.  ?Neurological:  ?   General: No focal deficit present.  ?   Mental Status: She is alert and oriented to person, place, and time. Mental status is at baseline.  ?Psychiatric:     ?   Mood and Affect: Mood normal.     ?   Behavior: Behavior normal.     ?   Thought Content: Thought content normal.     ?   Judgment: Judgment normal.  ? ? ? ? ? ? ?

## 2021-11-23 ENCOUNTER — Encounter: Payer: Self-pay | Admitting: Family Medicine

## 2021-11-27 ENCOUNTER — Encounter: Payer: Self-pay | Admitting: Physical Therapy

## 2021-11-27 ENCOUNTER — Ambulatory Visit: Payer: Medicare HMO | Admitting: Physical Therapy

## 2021-11-27 DIAGNOSIS — M542 Cervicalgia: Secondary | ICD-10-CM | POA: Diagnosis not present

## 2021-11-27 DIAGNOSIS — R293 Abnormal posture: Secondary | ICD-10-CM

## 2021-11-27 NOTE — Therapy (Signed)
[place] [facility] ?[place] ?[place], [place], [address] ?Phone: [phone]   Fax:  [phone] ? ?Physical Therapy Treatment ? ?Patient Details  ?Name: [facility] ?MRN: [id number] ?Date of Birth: [birth year] ?Referring Provider (PT): Hendricks Limes FNP. ? ? ?Encounter Date: 11/27/2021 ? ? PT End of Session - 11/27/21 1033   ? ? Visit Number 8   ? Number of Visits 12   ? Date for PT Re-Evaluation 12/06/21   ? Authorization Type FOTO.   ? PT Start Time 1035   ? PT Stop Time 1117   ? PT Time Calculation (min) 42 min   ? Activity Tolerance Patient tolerated treatment well   ? Behavior During Therapy [facility] for tasks assessed/performed   ? ?  ?  ? ?  ? ? ?Past Medical History:  ?Diagnosis Date  ? Diabetes mellitus   ? Difficulty swallowing   ? Diverticulitis   ? Essential hypertension   ? GERD (gastroesophageal reflux disease)   ? History of esophageal stricture   ? ? ?Past Surgical History:  ?Procedure Laterality Date  ? ABDOMINAL HYSTERECTOMY    ? APPENDECTOMY    ? CHOLECYSTECTOMY    ? SPINAL CORD DECOMPRESSION  1996  ? ? ?There were no vitals filed for this visit. ? ? Subjective Assessment - 11/27/21 1031   ? ? Subjective Just finished a 45 minute gym class and some UE strengthening. Had an episode of SVT and mowed her yard which made her feel like her head heavy.   ? Pertinent History Cogential spine issue, spinal core decompression, C-section x 2 and DM.   ? How long can you sit comfortably? Unlimited.   ? How long can you stand comfortably? Varies but better if in good posture.   ? Diagnostic tests Multilevel degenerative changes without acute osseous abnormality.   ? Patient Stated Goals Get out of pain.   ? Currently in Pain? Yes   ? Pain Score 3    ? Pain Location Neck   ? Pain Orientation Right;Left   ? Pain Descriptors / Indicators Dull   ? Pain Type Chronic pain   ? Pain Onset More than a month ago   ? Pain Frequency Constant   ? ?  ?  ? ?  ? ? ? ? ? [facility] PT  Assessment - 11/27/21 0001   ? ?  ? Assessment  ? Medical Diagnosis Chronic neck pain   ? Referring Provider (PT) Hendricks Limes FNP.   ? Next MD Visit 02/2022   ?  ? Precautions  ? Precautions None   ? ?  ?  ? ?  ? ? ? ? ? ? ? ? ? ? ? ? ? ? ? ? [place] Adult PT Treatment/Exercise - 11/27/21 0001   ? ?  ? Modalities  ? Modalities Electrical Stimulation;Moist Heat;Ultrasound   ?  ? Moist Heat Therapy  ? Number Minutes Moist Heat 15 Minutes   ? Moist Heat Location Cervical   ?  ? Electrical Stimulation  ? Electrical Stimulation Location Bilateral UT's.   ? Printmaker Action IFC   ? Electrical Stimulation Parameters 80-150 hz x15 min   ? Electrical Stimulation Goals Pain;Tone   ?  ? Ultrasound  ? Ultrasound Location B UT   ? Ultrasound Parameters Combo 1.5 w/cm2, 100%, 1 mhz x10 min   ? Ultrasound Goals Pain   ?  ? Manual Therapy  ? Manual Therapy Soft tissue mobilization   ?  Soft tissue mobilization STW to B UT, cervical paraspinals to reduce tone and discomfort   ? ?  ?  ? ?  ? ? ? ? ? ? ? ? ? ? ? ? ? ? ? PT Long Term Goals - 11/27/21 1154   ? ?  ? PT LONG TERM GOAL #1  ? Title Independent with a HEP.   ? Time 6   ? Period Weeks   ? Status On-going   ?  ? PT LONG TERM GOAL #2  ? Title Increase active cervical rotation to 70 degrees+ so patient can turn head more easily while driving.   ? Time 6   ? Period Weeks   ? Status On-going   ?  ? PT LONG TERM GOAL #3  ? Title Walk with grandchildren with neck pain not > 3/10.   ? Time 6   ? Period Weeks   ? Status On-going   ? ?  ?  ? ?  ? ? ? ? ? ? ? ? Plan - 11/27/21 1149   ? ? Clinical Impression Statement Patient presented in clinic with reports of pain yesterday after mowing her lawn. Patient stated that her head felt very heavy but has returned to her gym as well for group classes and weights training. Increased tone of the R UT noted during STW. Normal modalities response noted following removal of the modalities.   ? Personal Factors and Comorbidities  Comorbidity 1;Other   ? Comorbidities Cogential spine issue, spinal core decompression, C-section x 2 and DM.   ? Examination-Activity Limitations Other;Stand   ? Examination-Participation Restrictions Other;Meal Prep   ? Stability/Clinical Decision Making Stable/Uncomplicated   ? Rehab Potential Good   ? PT Frequency 2x / week   ? PT Duration 6 weeks   ? PT Treatment/Interventions ADLs/Self Care Home Management;Cryotherapy;Electrical Stimulation;Ultrasound;Moist Heat;Therapeutic activities;Therapeutic exercise;Manual techniques;Patient/family education;Passive range of motion;Dry needling   ? PT Next Visit Plan Progress ther ex.   ? Consulted and Agree with Plan of Care Patient   ? ?  ?  ? ?  ? ? ?Patient will benefit from skilled therapeutic intervention in order to improve the following deficits and impairments:  Decreased activity tolerance, Decreased range of motion, Pain, Postural dysfunction, Increased muscle spasms ? ?Visit Diagnosis: ?Cervicalgia ? ?Abnormal posture ? ? ? ? ?Problem List ?Patient Active Problem List  ? Diagnosis Date Noted  ? GERD (gastroesophageal reflux disease) 05/15/2021  ? Exertional shortness of breath 10/12/2020  ? Chronic left-sided low back pain without sciatica 10/12/2020  ? Right lower lobe pulmonary nodule 01/04/2020  ? Aortic atherosclerosis ([place]) 01/04/2020  ? Essential hypertension, benign   ? Type 2 diabetes mellitus with hyperglycemia, without long-term current use of insulin ([place])   ? Diverticulitis   ? Difficulty swallowing   ? ? ?Standley Brooking, PTA ?11/27/2021, 11:55 AM ? ?[facility] ?[place] ?[place], [place], [address] ?Phone: [phone]   Fax:  [phone] ? ?Name: Qunesha Washabaugh [facility] ?MRN: [id number] ?Date of Birth: 05/02/1956 ? ? ? ?

## 2021-11-29 ENCOUNTER — Ambulatory Visit: Payer: Medicare HMO | Admitting: Physical Therapy

## 2021-11-29 ENCOUNTER — Encounter: Payer: Self-pay | Admitting: Physical Therapy

## 2021-11-29 DIAGNOSIS — R293 Abnormal posture: Secondary | ICD-10-CM

## 2021-11-29 DIAGNOSIS — M542 Cervicalgia: Secondary | ICD-10-CM

## 2021-11-29 NOTE — Therapy (Signed)
[facility] [address] [place], [place], [address] Phone: [phone]   Fax:  [phone]  Physical Therapy Treatment  Patient Details  Name: Cassie Hunter MRN: [id number] Date of Birth: 1955/12/24 Referring Provider (PT): Deliah Boston FNP.   Encounter Date: 11/29/2021   PT End of Session - 11/29/21 1034     Visit Number 9    Number of Visits 12    Date for PT Re-Evaluation 12/06/21    Authorization Type FOTO.    PT Start Time 1037    PT Stop Time 1117    PT Time Calculation (min) 40 min    Activity Tolerance Patient tolerated treatment well    Behavior During Therapy WFL for tasks assessed/performed             Past Medical History:  Diagnosis Date   Diabetes mellitus    Difficulty swallowing    Diverticulitis    Essential hypertension    GERD (gastroesophageal reflux disease)    History of esophageal stricture     Past Surgical History:  Procedure Laterality Date   ABDOMINAL HYSTERECTOMY     APPENDECTOMY     CHOLECYSTECTOMY     SPINAL CORD DECOMPRESSION  1996    There were no vitals filed for this visit.   Subjective Assessment - 11/29/21 1034     Subjective Did a little more at the gym today and having some pain.    Pertinent History Cogential spine issue, spinal core decompression, C-section x 2 and DM.    How long can you sit comfortably? Unlimited.    How long can you stand comfortably? Varies but better if in good posture.    Diagnostic tests Multilevel degenerative changes without acute osseous abnormality.    Patient Stated Goals Get out of pain.    Currently in Pain? Yes    Pain Score 3     Pain Location Neck    Pain Orientation Left    Pain Descriptors / Indicators Discomfort    Pain Type Chronic pain    Pain Onset More than a month ago    Pain Frequency Constant                [facility] PT Assessment - 11/29/21 0001       Assessment   Medical Diagnosis Chronic neck pain     Referring Provider (PT) Deliah Boston FNP.    Next MD Visit 02/2022      Precautions   Precautions None                           [facility] Adult PT Treatment/Exercise - 11/29/21 0001       Modalities   Modalities Electrical Stimulation;Moist Heat;Ultrasound      Moist Heat Therapy   Number Minutes Moist Heat 15 Minutes    Moist Heat Location Cervical      Electrical Stimulation   Electrical Stimulation Location L UT    Electrical Stimulation Action Pre-Mod    Electrical Stimulation Parameters 80-150 hz x15 min    Electrical Stimulation Goals Pain;Tone      Ultrasound   Ultrasound Location L UT    Ultrasound Parameters Combo 1.5 w/cm2, 100%, 1 mhz x10 min    Ultrasound Goals Pain      Manual Therapy   Manual Therapy Soft tissue mobilization    Soft tissue mobilization STW to L UT, cervical paraspinals to reduce tone and discomfort  PT Long Term Goals - 11/27/21 1154       PT LONG TERM GOAL #1   Title Independent with a HEP.    Time 6    Period Weeks    Status On-going      PT LONG TERM GOAL #2   Title Increase active cervical rotation to 70 degrees+ so patient can turn head more easily while driving.    Time 6    Period Weeks    Status On-going      PT LONG TERM GOAL #3   Title Walk with grandchildren with neck pain not > 3/10.    Time 6    Period Weeks    Status On-going                   Plan - 11/29/21 1458     Clinical Impression Statement Patient presented in clinic with reports of pain in L cervical musculature but has been to the local gym this morning. Patient doing group classes but also weight machines and instructed to reduce tension in shoulders and shoulder elevation. Increased tone palpable in L UT. Normal modalities response noted following removal of the modalities. Patient to reduce to once a week with option to DC due to response.    Personal Factors and Comorbidities Comorbidity  1;Other    Comorbidities Cogential spine issue, spinal core decompression, C-section x 2 and DM.    Examination-Activity Limitations Other;Stand    Examination-Participation Restrictions Other;Meal Prep    Stability/Clinical Decision Making Stable/Uncomplicated    Rehab Potential Good    PT Frequency 2x / week    PT Duration 6 weeks    PT Treatment/Interventions ADLs/Self Care Home Management;Cryotherapy;Electrical Stimulation;Ultrasound;Moist Heat;Therapeutic activities;Therapeutic exercise;Manual techniques;Patient/family education;Passive range of motion;Dry needling    PT Next Visit Plan Progress ther ex.    Consulted and Agree with Plan of Care Patient             Patient will benefit from skilled therapeutic intervention in order to improve the following deficits and impairments:  Decreased activity tolerance, Decreased range of motion, Pain, Postural dysfunction, Increased muscle spasms  Visit Diagnosis: Cervicalgia  Abnormal posture     Problem List Patient Active Problem List   Diagnosis Date Noted   GERD (gastroesophageal reflux disease) 05/15/2021   Exertional shortness of breath 10/12/2020   Chronic left-sided low back pain without sciatica 10/12/2020   Right lower lobe pulmonary nodule 01/04/2020   Aortic atherosclerosis ([facility]) 01/04/2020   Essential hypertension, benign    Type 2 diabetes mellitus with hyperglycemia, without long-term current use of insulin ([facility])    Diverticulitis    Difficulty swallowing     [name], PTA 11/29/2021, 3:05 PM  [facility] [address] [place], [place], [address] Phone: [phone]   Fax:  [phone]  Name: Cassie Hunter MRN: [id number] Date of Birth: 03/13/1956

## 2021-12-05 ENCOUNTER — Ambulatory Visit: Payer: Medicare HMO

## 2021-12-05 DIAGNOSIS — R293 Abnormal posture: Secondary | ICD-10-CM | POA: Diagnosis not present

## 2021-12-05 DIAGNOSIS — M542 Cervicalgia: Secondary | ICD-10-CM

## 2021-12-05 NOTE — Therapy (Addendum)
[facility] [place], [place], [address] Phone: [phone]   Fax:  [phone]  Physical Therapy Treatment  Patient Details  Name: Cassie Hunter MRN: [id number] Date of Birth: 19-Oct-1955 Referring Provider (PT): Hendricks Limes FNP.   Encounter Date: 12/05/2021   PT End of Session - 12/05/21 0905     Visit Number 10    Number of Visits 12    Date for PT Re-Evaluation 12/06/21    Authorization Type FOTO.    PT Start Time 0900    PT Stop Time 1002    PT Time Calculation (min) 62 min    Activity Tolerance Patient tolerated treatment well    Behavior During Therapy WFL for tasks assessed/performed             Past Medical History:  Diagnosis Date   Diabetes mellitus    Difficulty swallowing    Diverticulitis    Essential hypertension    GERD (gastroesophageal reflux disease)    History of esophageal stricture     Past Surgical History:  Procedure Laterality Date   ABDOMINAL HYSTERECTOMY     APPENDECTOMY     CHOLECYSTECTOMY     SPINAL CORD DECOMPRESSION  1996    There were no vitals filed for this visit.   Subjective Assessment - 12/05/21 0905     Subjective Pt arrives for today's treatment session denying any pain and stating that she feels "pretty good" today.    Pertinent History Cogential spine issue, spinal core decompression, C-section x 2 and DM.    How long can you sit comfortably? Unlimited.    How long can you stand comfortably? Varies but better if in good posture.    Diagnostic tests Multilevel degenerative changes without acute osseous abnormality.    Patient Stated Goals Get out of pain.    Currently in Pain? No/denies    Pain Onset More than a month ago                               [facility] Adult PT Treatment/Exercise - 12/05/21 0001       Shoulder Exercises: Seated   Extension Strengthening;Both;20 reps;Theraband    Theraband Level (Shoulder Extension)  Level 1 (Yellow)    Row Strengthening;Both;20 reps;Theraband    Theraband Level (Shoulder Row) Level 1 (Yellow)    Horizontal ABduction Strengthening;Both;20 reps;Theraband    Theraband Level (Shoulder Horizontal ABduction) Level 1 (Yellow)    External Rotation Strengthening;Both;20 reps;Theraband    Theraband Level (Shoulder External Rotation) Level 1 (Yellow)      Shoulder Exercises: ROM/Strengthening   UBE (Upper Arm Bike) 10 mins (5 forward/backward)      Modalities   Modalities Electrical Stimulation;Moist Heat;Ultrasound      Moist Heat Therapy   Number Minutes Moist Heat 15 Minutes    Moist Heat Location Cervical      Electrical Stimulation   Electrical Stimulation Location Right Upper Trap    Electrical Stimulation Action Pre-mod    Electrical Stimulation Parameters 80-150 hz x 15 mins    Electrical Stimulation Goals Pain;Tone      Manual Therapy   Manual Therapy Soft tissue mobilization    Soft tissue mobilization STW to R UT and cervical paraspinals                          PT Long Term Goals - 12/05/21 [id number]  PT LONG TERM GOAL #1   Title Independent with a HEP.    Time 6    Period Weeks    Status Achieved      PT LONG TERM GOAL #2   Title Increase active cervical rotation to 70 degrees+ so patient can turn head more easily while driving.    Baseline 12/05/21: 50 degrees active cervical rotation to left, 45 degrees to right    Time 6    Period Weeks    Status On-going      PT LONG TERM GOAL #3   Title Walk with grandchildren with neck pain not > 3/10.    Baseline 12/05/21: have yet to try    Time 6    Period Weeks    Status On-going                   Plan - 12/05/21 0906     Clinical Impression Statement Pt arrives for today's treatment session denying any pain.  Pt able to tolerate UBE for warm up today x 10 mins.  Pt instructed in seated resisted shoulder/neck exercises to increase strength and function.  Pt requiring min  cues for proper technique and posture.  Pt able to demonstrate 50 degrees of right cervical rotation and 45 degrees of left cervical flexion.  Pt has not attempted to walk with her grandchildren as of yet.  Pt able to increase FOTO score to 57 today.  Normal responses to estim and MH noted upon removal.  Pt denied any pain upon completion of today's treatment session.    Personal Factors and Comorbidities Comorbidity 1;Other    Comorbidities Cogential spine issue, spinal core decompression, C-section x 2 and DM.    Examination-Activity Limitations Other;Stand    Examination-Participation Restrictions Other;Meal Prep    Stability/Clinical Decision Making Stable/Uncomplicated    Rehab Potential Good    PT Frequency 2x / week    PT Duration 6 weeks    PT Treatment/Interventions ADLs/Self Care Home Management;Cryotherapy;Electrical Stimulation;Ultrasound;Moist Heat;Therapeutic activities;Therapeutic exercise;Manual techniques;Patient/family education;Passive range of motion;Dry needling    PT Next Visit Plan Progress ther ex.    Consulted and Agree with Plan of Care Patient             Patient will benefit from skilled therapeutic intervention in order to improve the following deficits and impairments:  Decreased activity tolerance, Decreased range of motion, Pain, Postural dysfunction, Increased muscle spasms  Visit Diagnosis: Cervicalgia  Abnormal posture     Problem List Patient Active Problem List   Diagnosis Date Noted   GERD (gastroesophageal reflux disease) 05/15/2021   Exertional shortness of breath 10/12/2020   Chronic left-sided low back pain without sciatica 10/12/2020   Right lower lobe pulmonary nodule 01/04/2020   Aortic atherosclerosis ([facility]) 01/04/2020   Essential hypertension, benign    Type 2 diabetes mellitus with hyperglycemia, without long-term current use of insulin ([facility])    Diverticulitis    Difficulty swallowing     Newman Pies, PTA 12/05/2021,  10:05 AM  [facility] [address] [place], [place], [address] Phone: [phone]   Fax:  [phone]  Name: Cassie Hunter MRN: [id number] Date of Birth: [birth year]  Progress Note Reporting Period 10/25/21 to 12/05/21  See note below for Objective Data and Assessment of Progress/Goals.   Patient is making fair progress skilled physical therapy. She reported reduced cervical pain during today's treatment compared to previous appointments. However, she continues to exhibit reduced cervical AROM. Recommend  that she continue with her current plan of care to address her remaining impairments to maximize her safety and function with daily activities.   Jacqulynn Cadet, PT, DPT

## 2021-12-12 ENCOUNTER — Ambulatory Visit: Payer: Medicare HMO

## 2021-12-13 ENCOUNTER — Ambulatory Visit: Payer: Medicare HMO | Admitting: Nurse Practitioner

## 2021-12-13 ENCOUNTER — Telehealth: Payer: Self-pay

## 2021-12-13 ENCOUNTER — Ambulatory Visit (INDEPENDENT_AMBULATORY_CARE_PROVIDER_SITE_OTHER): Payer: Medicare HMO

## 2021-12-13 ENCOUNTER — Encounter: Payer: Self-pay | Admitting: Family Medicine

## 2021-12-13 ENCOUNTER — Ambulatory Visit (INDEPENDENT_AMBULATORY_CARE_PROVIDER_SITE_OTHER): Payer: Medicare HMO | Admitting: Family Medicine

## 2021-12-13 VITALS — BP 131/77 | HR 81 | Temp 97.5°F | Ht 63.0 in | Wt 151.2 lb

## 2021-12-13 VITALS — Wt 151.0 lb

## 2021-12-13 DIAGNOSIS — R112 Nausea with vomiting, unspecified: Secondary | ICD-10-CM | POA: Diagnosis not present

## 2021-12-13 DIAGNOSIS — Z0000 Encounter for general adult medical examination without abnormal findings: Secondary | ICD-10-CM

## 2021-12-13 DIAGNOSIS — R3915 Urgency of urination: Secondary | ICD-10-CM | POA: Diagnosis not present

## 2021-12-13 LAB — URINALYSIS, ROUTINE W REFLEX MICROSCOPIC
Bilirubin, UA: NEGATIVE
Glucose, UA: NEGATIVE
Ketones, UA: NEGATIVE
Leukocytes,UA: NEGATIVE
Nitrite, UA: NEGATIVE
Protein,UA: NEGATIVE
RBC, UA: NEGATIVE
Specific Gravity, UA: 1.025 (ref 1.005–1.030)
Urobilinogen, Ur: 0.2 mg/dL (ref 0.2–1.0)
pH, UA: 5.5 (ref 5.0–7.5)

## 2021-12-13 MED ORDER — ONDANSETRON 4 MG PO TBDP: 4.0000 mg | ORAL_TABLET | Freq: Three times a day (TID) | ORAL | 0 refills | Status: DC | PRN

## 2021-12-13 NOTE — Telephone Encounter (Signed)
When I talked with her this am, I made her a sick appt with Gennette Pac, but after we hung up I saw Brayton El has an opening this afternoon. I tried to call her back, but her voicemail is full.

## 2021-12-13 NOTE — Progress Notes (Signed)
Subjective:   Cassie Hunter is a 66 y.o. female who presents for Medicare Annual (Subsequent) preventive examination.  Virtual Visit via Telephone Note  I connected with  Cassie Hunter on 12/13/21 at  9:00 AM EDT by telephone and verified that I am speaking with the correct person using two identifiers.  Location: Patient: Home Provider: [facility] Persons participating in the virtual visit: patient/Nurse Health Advisor   I discussed the limitations, risks, security and privacy concerns of performing an evaluation and management service by telephone and the availability of in person appointments. The patient expressed understanding and agreed to proceed.  Interactive audio and video telecommunications were attempted between this nurse and patient, however failed, due to patient having technical difficulties OR patient did not have access to video capability.  We continued and completed visit with audio only.  Some vital signs may be absent or patient reported.   Cassie Hunter Cassie Tanequa Kretz, LPN   Review of Systems     Cardiac Risk Factors include: advanced age (>2men, >52 women);diabetes mellitus;hypertension;Other (see comment), Risk factor comments: atherosclerosis     Objective:    Today's Vitals   12/09/21 2121 12/13/21 0907 12/13/21 0909  Weight:  151 lb (68.5 kg)   PainSc: 3   3    Body mass index is 26.75 kg/m.     12/13/2021    9:17 AM 10/25/2021    3:15 PM 08/16/2021    5:36 PM 12/08/2020    8:30 AM 11/05/2015    7:08 PM  Advanced Directives  Does Patient Have a Medical Advance Directive? No No No No No  Would patient like information on creating a medical advance directive? No - Patient declined  No - Patient declined No - Patient declined No - patient declined information    Current Medications (verified) Outpatient Encounter Medications as of 12/13/2021  Medication Sig   albuterol (VENTOLIN HFA) 108 (90 Base) MCG/ACT inhaler Inhale 2 puffs into the lungs every 6  (six) hours as needed.   Alcohol Swabs (B-D SINGLE USE SWABS REGULAR) PADS Test BS daily and as needed Dx E11.9   aspirin 81 MG EC tablet Take 1 tablet (81 mg total) by mouth daily. Swallow whole.   Blood Glucose Calibration (TRUE METRIX LEVEL 1) Low SOLN Use with glucose monitor Dx E11.9   diltiazem (CARDIZEM CD) 120 MG 24 hr capsule Take 1 capsule (120 mg total) by mouth daily.   diltiazem (CARDIZEM) 30 MG tablet Take 1 tablet (30 mg total) by mouth 4 (four) times daily as needed.   glucose blood (TRUE METRIX BLOOD GLUCOSE TEST) test strip Test BS daily and as needed Dx E11.9   hydrochlorothiazide (HYDRODIURIL) 12.5 MG tablet Take 1 tablet (12.5 mg total) by mouth daily.   losartan (COZAAR) 100 MG tablet TAKE 1 TABLET EVERY DAY   metFORMIN (GLUCOPHAGE) 500 MG tablet Take 2 tablets (1,000 mg total) by mouth daily with breakfast AND 1 tablet (500 mg total) daily with supper.   naproxen (NAPROSYN) 500 MG tablet Take 1 tablet (500 mg total) by mouth 2 (two) times daily as needed for moderate pain.   omeprazole (PRILOSEC) 40 MG capsule Take 1 capsule (40 mg total) by mouth 2 (two) times daily before a meal.   simvastatin (ZOCOR) 20 MG tablet Take 1 tablet (20 mg total) by mouth daily at 6 PM.   TRUEplus Lancets 33G MISC Test BS daily and as needed Dx E11.9   No facility-administered encounter medications on file as of  12/13/2021.    Allergies (verified) Codeine and Neosporin [bacitracin-polymyxin b]   History: Past Medical History:  Diagnosis Date   Diabetes mellitus    Difficulty swallowing    Diverticulitis    Essential hypertension    GERD (gastroesophageal reflux disease)    History of esophageal stricture    Past Surgical History:  Procedure Laterality Date   ABDOMINAL HYSTERECTOMY     APPENDECTOMY     CESAREAN SECTION  02/26/1988-07/01/1990   CHOLECYSTECTOMY     SPINAL CORD DECOMPRESSION  1996   TUBAL LIGATION  07/01/1990   Family History  Problem Relation Age of Onset    Pancreatic cancer Father    Lung cancer Father    Cancer Father    CVA Mother    Stroke Mother    Spina bifida Sister    Heart attack Maternal Grandmother    Heart attack Maternal Grandfather    Stroke Paternal Grandmother    Aneurysm Paternal Grandfather    ADD / ADHD Son    Asthma Son    Social History   Socioeconomic History   Marital status: Married    Spouse name: Fritz Pickerel   Number of children: 2   Years of education: Not on file   Highest education level: Not on file  Occupational History   Occupation: Retired Consulting civil engineer  Tobacco Use   Smoking status: Never   Smokeless tobacco: Never  Vaping Use   Vaping Use: Never used  Substance and Sexual Activity   Alcohol use: No   Drug use: No   Sexual activity: Not Currently    Birth control/protection: Surgical  Other Topics Concern   Not on file  Social History Narrative   Lives with her husband in one level home   Daughter lives 2 blocks away   Social Determinants of Health   Financial Resource Strain: Low Risk    Difficulty of Paying Living Expenses: Not hard at all  Food Insecurity: No Food Insecurity   Worried About Charity fundraiser in the Last Year: Never true   Arboriculturist in the Last Year: Never true  Transportation Needs: No Transportation Needs   Lack of Transportation (Medical): No   Lack of Transportation (Non-Medical): No  Physical Activity: Insufficiently Active   Days of Exercise per Week: 3 days   Minutes of Exercise per Session: 40 min  Stress: No Stress Concern Present   Feeling of Stress : Not at all  Social Connections: Moderately Integrated   Frequency of Communication with Friends and Family: More than three times a week   Frequency of Social Gatherings with Friends and Family: Once a week   Attends Religious Services: Never   Marine scientist or Organizations: Yes   Attends Music therapist: More than 4 times per year   Marital Status: Married    Tobacco  Counseling Counseling given: Not Answered   Clinical Intake:  Pre-visit preparation completed: Yes  Pain : 0-10 Pain Score: 3  Pain Type: Chronic pain Pain Location: Neck Pain Orientation: Left, Posterior Pain Radiating Towards: head Pain Descriptors / Indicators: Aching, Sharp Pain Onset: More than a month ago Pain Frequency: Intermittent     BMI - recorded: 26.75 Nutritional Status: BMI 25 -29 Overweight Nutritional Risks: Nausea/ vomitting/ diarrhea (x4 days - seeing on call provider today) Diabetes: Yes CBG done?: No Did pt. bring in CBG monitor from home?: No  How often do you need to have someone help you when  you read instructions, pamphlets, or other written materials from your doctor or pharmacy?: 1 - Never  Diabetic? Nutrition Risk Assessment:  Has the patient had any N/V/D within the last 2 months?  Yes  Does the patient have any non-healing wounds?  No  Has the patient had any unintentional weight loss or weight gain?  No   Diabetes:  Is the patient diabetic?  Yes  If diabetic, was a CBG obtained today?  No  Did the patient bring in their glucometer from home?  No  How often do you monitor your CBG's? Once daily fasting - 152 this am per patient.   Financial Strains and Diabetes Management:  Are you having any financial strains with the device, your supplies or your medication? No .  Does the patient want to be seen by Chronic Care Management for management of their diabetes?  No  Would the patient like to be referred to a Nutritionist or for Diabetic Management?  No   Diabetic Exams:  Diabetic Eye Exam: Completed 02/21/2021.   Diabetic Foot Exam: Completed 10/12/2020. Pt has been advised about the importance in completing this exam. Pt is scheduled for diabetic foot exam on 02/19/2022 at Physical with PCP.    Interpreter Needed?: No  Information entered by :: Sarkis Rhines, LPN   Activities of Daily Living    12/13/2021    9:18 AM 12/09/2021    9:21  PM  In your present state of health, do you have any difficulty performing the following activities:  Hearing? 0 0  Vision? 0 0  Difficulty concentrating or making decisions? 0 0  Walking or climbing stairs? 0 0  Dressing or bathing? 0 0  Doing errands, shopping? 0 0  Preparing Food and eating ? N N  Using the Toilet? N N  In the past six months, have you accidently leaked urine? N N  Do you have problems with loss of bowel control? N N  Managing your Medications? N N  Managing your Finances? N N  Housekeeping or managing your Housekeeping? N N    Patient Care Team: Gwenlyn FudgeJoyce, Britney F, FNP as PCP - General (Family Medicine) Kathleene HazelMcAlhany, Christopher D, MD as PCP - Cardiology (Cardiology) Delora FuelJohnson, Brent, OD (Optometry)  Indicate any recent Medical Services you may have received from other than [facility] providers in the past year (date may be approximate).     Assessment:   This is a routine wellness examination for Cassie Hunter.  Hearing/Vision screen Hearing Screening - Comments:: Denies hearing difficulties   Vision Screening - Comments:: Wears rx glasses - up to date with routine eye exams with [facility]  Dietary issues and exercise activities discussed: Current Exercise Habits: Home exercise routine, Type of exercise: walking;stretching;exercise ball;calisthenics;strength training/weights, Time (Minutes): 40, Frequency (Times/Week): 3, Weekly Exercise (Minutes/Week): 120, Intensity: Moderate, Exercise limited by: orthopedic condition(s);cardiac condition(s)   Goals Addressed               This Visit's Progress     Control Diabetes (pt-stated)   On track     Control diabetes.       Patient Stated        Wants to get back to be able to walk 3 miles per day 3-4 days per week Right now only tolerating 1 mile       Depression Screen    12/13/2021    9:15 AM 11/21/2021    3:24 PM 10/11/2021   11:05 AM 08/21/2021   10:22 AM 08/06/2021  2:35 PM 05/15/2021    8:46 AM 01/11/2021     8:30 AM  PHQ 2/9 Scores  PHQ - 2 Score 0 0 0 0 0 0 0  PHQ- 9 Score 0 0 1 1  2 1     Fall Risk    12/13/2021    9:11 AM 12/09/2021    9:21 PM 10/11/2021   11:05 AM 08/21/2021   10:22 AM 08/06/2021    2:35 PM  Fall Risk   Falls in the past year? 1 1 1 1  0  Number falls in past yr: 0 0 0 0   Injury with Fall? 1 0 0 0   Comment hit her head - didn't get checked out - still having headaches      Risk for fall due to : History of fall(s);Orthopedic patient      Follow up Education provided;Falls prevention discussed  Falls prevention discussed Falls prevention discussed     FALL RISK PREVENTION PERTAINING TO THE HOME:  Any stairs in or around the home? Yes  If so, are there any without handrails? No  Home free of loose throw rugs in walkways, pet beds, electrical cords, etc? Yes  Adequate lighting in your home to reduce risk of falls? Yes   ASSISTIVE DEVICES UTILIZED TO PREVENT FALLS:  Life alert? No  Use of a cane, walker or w/c? No  Grab bars in the bathroom? Yes  Shower chair or bench in shower? Yes  Elevated toilet seat or a handicapped toilet? No   TIMED UP AND GO:  Was the test performed? No . Telephonic visit  Cognitive Function:    12/08/2020    8:32 AM  MMSE - Mini Mental State Exam  Orientation to time 5  Orientation to Place 5  Registration 3  Attention/ Calculation 5  Recall 2  Language- name 2 objects 2  Language- repeat 1  Language- follow 3 step command 3  Language- read & follow direction 1  Write a sentence 1  Copy design 1  Total score 29        12/13/2021    9:19 AM  6CIT Screen  What Year? 0 points  What month? 0 points  What time? 0 points  Count back from 20 0 points  Months in reverse 0 points  Repeat phrase 0 points  Total Score 0 points    Immunizations Immunization History  Administered Date(s) Administered   Fluad Quad(high Dose 65+) 06/15/2020, 05/10/2021   Influenza, Seasonal, Injecte, Preservative Fre 06/01/2014    Influenza,inj,Quad PF,6+ Mos 06/01/2014, 05/02/2017, 04/30/2018, 06/22/2019   PFIZER(Purple Top)SARS-COV-2 Vaccination 03/22/2020, 04/12/2020   Pneumococcal Conjugate-13 10/12/2020   Pneumococcal Polysaccharide-23 05/27/2017   Tdap 07/16/2015    TDAP status: Up to date  Flu Vaccine status: Up to date  Pneumococcal vaccine status: Up to date  Covid-19 vaccine status: Completed vaccines  Qualifies for Shingles Vaccine? Yes   Zostavax completed No   Shingrix Completed?: No.    Education has been provided regarding the importance of this vaccine. Patient has been advised to call insurance company to determine out of pocket expense if they have not yet received this vaccine. Advised may also receive vaccine at local pharmacy or Health Dept. Verbalized acceptance and understanding.  Screening Tests Health Maintenance  Topic Date Due   Zoster Vaccines- Shingrix (1 of 2) Never done   COVID-19 Vaccine (3 - Booster for Pfizer series) 06/07/2020   FOOT EXAM  10/12/2021   INFLUENZA VACCINE  02/12/2022  HEMOGLOBIN A1C  02/18/2022   OPHTHALMOLOGY EXAM  02/21/2022   Pneumonia Vaccine 37+ Years old (3) 05/27/2022   MAMMOGRAM  09/13/2022   Fecal DNA (Cologuard)  05/01/2023   TETANUS/TDAP  07/15/2025   DEXA SCAN  Completed   Hepatitis C Screening  Completed   HPV VACCINES  Aged Out    Health Maintenance  Health Maintenance Due  Topic Date Due   Zoster Vaccines- Shingrix (1 of 2) Never done   COVID-19 Vaccine (3 - Booster for Pfizer series) 06/07/2020   FOOT EXAM  10/12/2021    Colorectal cancer screening: Type of screening: Cologuard. Completed 04/30/2020. Repeat every 3 years  Mammogram status: Completed 09/12/2021. Repeat every year  Bone Density status: Completed 05/08/2020. Results reflect: Bone density results: NORMAL. Repeat every 5 years.  Lung Cancer Screening: (Low Dose CT Chest recommended if Age 49-80 years, 30 pack-year currently smoking OR have quit w/in 15years.) does  not qualify.   Additional Screening:  Hepatitis C Screening: does qualify; Completed 01/02/2017  Vision Screening: Recommended annual ophthalmology exams for early detection of glaucoma and other disorders of the eye. Is the patient up to date with their annual eye exam?  Yes  Who is the provider or what is the name of the office in which the patient attends annual eye exams? [place] If pt is not established with a provider, would they like to be referred to a provider to establish care? No .   Dental Screening: Recommended annual dental exams for proper oral hygiene  Community Resource Referral / Chronic Care Management: CRR required this visit?  No   CCM required this visit?  No      Plan:     I have personally reviewed and noted the following in the patient's chart:   Medical and social history Use of alcohol, tobacco or illicit drugs  Current medications and supplements including opioid prescriptions.  Functional ability and status Nutritional status Physical activity Advanced directives List of other physicians Hospitalizations, surgeries, and ER visits in previous 12 months Vitals Screenings to include cognitive, depression, and falls Referrals and appointments  In addition, I have reviewed and discussed with patient certain preventive protocols, quality metrics, and best practice recommendations. A written personalized care plan for preventive services as well as general preventive health recommendations were provided to patient.     Sandrea Hammond, LPN   [id number]   Nurse Notes: She has had nausea since Monday, vomited last night and again this morning - no known cause - taking meds appropriately, BS has been running 120-150 fasting daily. Made appt as requested with on call, Shelah Lewandowsky today

## 2021-12-13 NOTE — Progress Notes (Signed)
Assessment & Plan:  1. Nausea and vomiting in adult - ondansetron (ZOFRAN-ODT) 4 MG disintegrating tablet; Take 1 tablet (4 mg total) by mouth every 8 (eight) hours as needed for nausea or vomiting.  Dispense: 30 tablet; Refill: 0  2. Urgency of urination - Urinalysis, Routine w reflex microscopic - Urine dipstick shows negative for all components.     Follow up plan: Return if symptoms worsen or fail to improve.  Deliah Boston, MSN, APRN, FNP-C [facility]  Subjective:   Patient ID: Cassie Hunter, female    DOB: 1956-04-14, 66 y.o.   MRN: [id number]  HPI: Takiah Maiden is a 66 y.o. female presenting on 12/13/2021 for Nausea  Patient reports nausea for the past 4 days.  She did vomit last night and this morning.  Her vomit is watery.  She does not have much of an appetite.  States she ate one piece of pizza last night for dinner, ate half of a waffle this morning for breakfast, and half of a piece of pizza for lunch today.  Denies fever, diarrhea, abdominal pain, and acid reflux.  States her dysphagia has been better since increasing omeprazole to twice daily.  Patient also reports sometimes she feels a strong urge to urinate, but then when she goes only has a trickle come out.  This has been going on for the past month.   ROS: Negative unless specifically indicated above in HPI.   Relevant past medical history reviewed and updated as indicated.   Allergies and medications reviewed and updated.   Current Outpatient Medications:    albuterol (VENTOLIN HFA) 108 (90 Base) MCG/ACT inhaler, Inhale 2 puffs into the lungs every 6 (six) hours as needed., Disp: 18 g, Rfl: 2   Alcohol Swabs (B-D SINGLE USE SWABS REGULAR) PADS, Test BS daily and as needed Dx E11.9, Disp: 100 each, Rfl: 3   aspirin 81 MG EC tablet, Take 1 tablet (81 mg total) by mouth daily. Swallow whole., Disp: 30 tablet, Rfl: 12   Blood Glucose Calibration (TRUE METRIX LEVEL 1) Low  SOLN, Use with glucose monitor Dx E11.9, Disp: 3 each, Rfl: 3   diltiazem (CARDIZEM CD) 120 MG 24 hr capsule, Take 1 capsule (120 mg total) by mouth daily., Disp: 90 capsule, Rfl: 3   diltiazem (CARDIZEM) 30 MG tablet, Take 1 tablet (30 mg total) by mouth 4 (four) times daily as needed., Disp: 60 tablet, Rfl: 1   glucose blood (TRUE METRIX BLOOD GLUCOSE TEST) test strip, Test BS daily and as needed Dx E11.9, Disp: 100 each, Rfl: 3   hydrochlorothiazide (HYDRODIURIL) 12.5 MG tablet, Take 1 tablet (12.5 mg total) by mouth daily., Disp: 90 tablet, Rfl: 1   losartan (COZAAR) 100 MG tablet, TAKE 1 TABLET EVERY DAY, Disp: 90 tablet, Rfl: 1   metFORMIN (GLUCOPHAGE) 500 MG tablet, Take 2 tablets (1,000 mg total) by mouth daily with breakfast AND 1 tablet (500 mg total) daily with supper., Disp: 90 tablet, Rfl: 0   naproxen (NAPROSYN) 500 MG tablet, Take 1 tablet (500 mg total) by mouth 2 (two) times daily as needed for moderate pain., Disp: 30 tablet, Rfl: 0   omeprazole (PRILOSEC) 40 MG capsule, Take 1 capsule (40 mg total) by mouth 2 (two) times daily before a meal., Disp: 180 capsule, Rfl: 1   simvastatin (ZOCOR) 20 MG tablet, Take 1 tablet (20 mg total) by mouth daily at 6 PM., Disp: 90 tablet, Rfl: 1   TRUEplus Lancets 33G  MISC, Test BS daily and as needed Dx E11.9, Disp: 100 each, Rfl: 3  Allergies  Allergen Reactions   Codeine Nausea Only and Other (See Comments)   Neosporin [Bacitracin-Polymyxin B] Rash    Objective:   BP 131/77   Pulse 81   Temp (!) 97.5 F (36.4 C)   Ht 5\' 3"  (1.6 m)   Wt 151 lb 3.2 oz (68.6 kg)   SpO2 98%   BMI 26.78 kg/m    Physical Exam Vitals reviewed.  Constitutional:      General: She is not in acute distress.    Appearance: Normal appearance. She is not ill-appearing, toxic-appearing or diaphoretic.  HENT:     Head: Normocephalic and atraumatic.  Eyes:     General: No scleral icterus.       Right eye: No discharge.        Left eye: No discharge.      Conjunctiva/sclera: Conjunctivae normal.  Cardiovascular:     Rate and Rhythm: Normal rate.  Pulmonary:     Effort: Pulmonary effort is normal. No respiratory distress.  Abdominal:     General: Abdomen is flat. Bowel sounds are normal.     Palpations: Abdomen is soft. There is no shifting dullness, fluid wave, hepatomegaly, splenomegaly, mass or pulsatile mass.     Tenderness: There is generalized abdominal tenderness.  Musculoskeletal:        General: Normal range of motion.     Cervical back: Normal range of motion.  Skin:    General: Skin is warm and dry.     Capillary Refill: Capillary refill takes less than 2 seconds.  Neurological:     General: No focal deficit present.     Mental Status: She is alert and oriented to person, place, and time. Mental status is at baseline.  Psychiatric:        Mood and Affect: Mood normal.        Behavior: Behavior normal.        Thought Content: Thought content normal.        Judgment: Judgment normal.

## 2021-12-13 NOTE — Patient Instructions (Signed)
Cassie Hunter , Thank you for taking time to come for your Medicare Wellness Visit. I appreciate your ongoing commitment to your health goals. Please review the following plan we discussed and let me know if I can assist you in the future.   Screening recommendations/referrals: Colonoscopy: Cologuard done 04/30/2020 - repeat in 3 years Mammogram: Done 09/12/2021 - Repeat annually Bone Density: Done 05/08/2020 - Repeat in 5 years  Recommended yearly ophthalmology/optometry visit for glaucoma screening and checkup Recommended yearly dental visit for hygiene and checkup  Vaccinations: Influenza vaccine: Done 05/10/2021 - Repeat annually Pneumococcal vaccine: Done 05/27/2017 & 10/12/2020 Tdap vaccine: Done 2017 - Repeat in 10 years  Shingles vaccine: Due - Shingrix is 2 doses 2-6 months apart and over 90% effective     Covid-19:Done  03/22/2020 & 04/12/2020 - for additional boosters, contact pharmacy  Advanced directives: Advance directive discussed with you today. Even though you declined this today, please call our office should you change your mind, and we can give you the proper paperwork for you to fill out.   Conditions/risks identified: Keep up the great work going to the gym! Aim for 30 minutes of exercise or brisk walking, 6-8 glasses of water, and 5 servings of fruits and vegetables each day.   Next appointment: Follow up in one year for your annual wellness visit    Preventive Care 65 Years and Older, Female Preventive care refers to lifestyle choices and visits with your health care provider that can promote health and wellness. What does preventive care include? A yearly physical exam. This is also called an annual well check. Dental exams once or twice a year. Routine eye exams. Ask your health care provider how often you should have your eyes checked. Personal lifestyle choices, including: Daily care of your teeth and gums. Regular physical activity. Eating a healthy  diet. Avoiding tobacco and drug use. Limiting alcohol use. Practicing safe sex. Taking low-dose aspirin every day. Taking vitamin and mineral supplements as recommended by your health care provider. What happens during an annual well check? The services and screenings done by your health care provider during your annual well check will depend on your age, overall health, lifestyle risk factors, and family history of disease. Counseling  Your health care provider may ask you questions about your: Alcohol use. Tobacco use. Drug use. Emotional well-being. Home and relationship well-being. Sexual activity. Eating habits. History of falls. Memory and ability to understand (cognition). Work and work Statistician. Reproductive health. Screening  You may have the following tests or measurements: Height, weight, and BMI. Blood pressure. Lipid and cholesterol levels. These may be checked every 5 years, or more frequently if you are over 61 years old. Skin check. Lung cancer screening. You may have this screening every year starting at age 37 if you have a 30-pack-year history of smoking and currently smoke or have quit within the past 15 years. Fecal occult blood test (FOBT) of the stool. You may have this test every year starting at age 58. Flexible sigmoidoscopy or colonoscopy. You may have a sigmoidoscopy every 5 years or a colonoscopy every 10 years starting at age 76. Hepatitis C blood test. Hepatitis B blood test. Sexually transmitted disease (STD) testing. Diabetes screening. This is done by checking your blood sugar (glucose) after you have not eaten for a while (fasting). You may have this done every 1-3 years. Bone density scan. This is done to screen for osteoporosis. You may have this done starting at age 56. Mammogram.  This may be done every 1-2 years. Talk to your health care provider about how often you should have regular mammograms. Talk with your health care provider about  your test results, treatment options, and if necessary, the need for more tests. Vaccines  Your health care provider may recommend certain vaccines, such as: Influenza vaccine. This is recommended every year. Tetanus, diphtheria, and acellular pertussis (Tdap, Td) vaccine. You may need a Td booster every 10 years. Zoster vaccine. You may need this after age 57. Pneumococcal 13-valent conjugate (PCV13) vaccine. One dose is recommended after age 60. Pneumococcal polysaccharide (PPSV23) vaccine. One dose is recommended after age 87. Talk to your health care provider about which screenings and vaccines you need and how often you need them. This information is not intended to replace advice given to you by your health care provider. Make sure you discuss any questions you have with your health care provider. Document Released: 07/28/2015 Document Revised: 03/20/2016 Document Reviewed: 05/02/2015 Elsevier Interactive Patient Education  2017 [place] Prevention in the Home Falls can cause injuries. They can happen to people of all ages. There are many things you can do to make your home safe and to help prevent falls. What can I do on the outside of my home? Regularly fix the edges of walkways and driveways and fix any cracks. Remove anything that might make you trip as you walk through a door, such as a raised step or threshold. Trim any bushes or trees on the path to your home. Use bright outdoor lighting. Clear any walking paths of anything that might make someone trip, such as rocks or tools. Regularly check to see if handrails are loose or broken. Make sure that both sides of any steps have handrails. Any raised decks and porches should have guardrails on the edges. Have any leaves, snow, or ice cleared regularly. Use sand or salt on walking paths during winter. Clean up any spills in your garage right away. This includes oil or grease spills. What can I do in the bathroom? Use  night lights. Install grab bars by the toilet and in the tub and shower. Do not use towel bars as grab bars. Use non-skid mats or decals in the tub or shower. If you need to sit down in the shower, use a plastic, non-slip stool. Keep the floor dry. Clean up any water that spills on the floor as soon as it happens. Remove soap buildup in the tub or shower regularly. Attach bath mats securely with double-sided non-slip rug tape. Do not have throw rugs and other things on the floor that can make you trip. What can I do in the bedroom? Use night lights. Make sure that you have a light by your bed that is easy to reach. Do not use any sheets or blankets that are too big for your bed. They should not hang down onto the floor. Have a firm chair that has side arms. You can use this for support while you get dressed. Do not have throw rugs and other things on the floor that can make you trip. What can I do in the kitchen? Clean up any spills right away. Avoid walking on wet floors. Keep items that you use a lot in easy-to-reach places. If you need to reach something above you, use a strong step stool that has a grab bar. Keep electrical cords out of the way. Do not use floor polish or wax that makes floors slippery. If you  must use wax, use non-skid floor wax. Do not have throw rugs and other things on the floor that can make you trip. What can I do with my stairs? Do not leave any items on the stairs. Make sure that there are handrails on both sides of the stairs and use them. Fix handrails that are broken or loose. Make sure that handrails are as long as the stairways. Check any carpeting to make sure that it is firmly attached to the stairs. Fix any carpet that is loose or worn. Avoid having throw rugs at the top or bottom of the stairs. If you do have throw rugs, attach them to the floor with carpet tape. Make sure that you have a light switch at the top of the stairs and the bottom of the  stairs. If you do not have them, ask someone to add them for you. What else can I do to help prevent falls? Wear shoes that: Do not have high heels. Have rubber bottoms. Are comfortable and fit you well. Are closed at the toe. Do not wear sandals. If you use a stepladder: Make sure that it is fully opened. Do not climb a closed stepladder. Make sure that both sides of the stepladder are locked into place. Ask someone to hold it for you, if possible. Clearly mark and make sure that you can see: Any grab bars or handrails. First and last steps. Where the edge of each step is. Use tools that help you move around (mobility aids) if they are needed. These include: Canes. Walkers. Scooters. Crutches. Turn on the lights when you go into a dark area. Replace any light bulbs as soon as they burn out. Set up your furniture so you have a clear path. Avoid moving your furniture around. If any of your floors are uneven, fix them. If there are any pets around you, be aware of where they are. Review your medicines with your doctor. Some medicines can make you feel dizzy. This can increase your chance of falling. Ask your doctor what other things that you can do to help prevent falls. This information is not intended to replace advice given to you by your health care provider. Make sure you discuss any questions you have with your health care provider. Document Released: 04/27/2009 Document Revised: 12/07/2015 Document Reviewed: 08/05/2014 Elsevier Interactive Patient Education  2017 [employer].

## 2021-12-17 ENCOUNTER — Other Ambulatory Visit: Payer: Self-pay

## 2021-12-17 ENCOUNTER — Encounter: Payer: Self-pay | Admitting: Family Medicine

## 2021-12-17 DIAGNOSIS — I10 Essential (primary) hypertension: Secondary | ICD-10-CM

## 2021-12-17 MED ORDER — HYDROCHLOROTHIAZIDE 12.5 MG PO TABS: 12.5000 mg | ORAL_TABLET | Freq: Every day | ORAL | 0 refills | Status: DC

## 2021-12-21 ENCOUNTER — Encounter: Payer: Self-pay | Admitting: Family Medicine

## 2021-12-21 ENCOUNTER — Ambulatory Visit (INDEPENDENT_AMBULATORY_CARE_PROVIDER_SITE_OTHER): Payer: Medicare HMO | Admitting: Family Medicine

## 2021-12-21 VITALS — BP 136/74 | Temp 98.5°F | Ht 63.0 in | Wt 151.2 lb

## 2021-12-21 DIAGNOSIS — B353 Tinea pedis: Secondary | ICD-10-CM | POA: Diagnosis not present

## 2021-12-21 DIAGNOSIS — B351 Tinea unguium: Secondary | ICD-10-CM

## 2021-12-21 DIAGNOSIS — L84 Corns and callosities: Secondary | ICD-10-CM | POA: Diagnosis not present

## 2021-12-21 DIAGNOSIS — E1165 Type 2 diabetes mellitus with hyperglycemia: Secondary | ICD-10-CM | POA: Diagnosis not present

## 2021-12-21 MED ORDER — NYSTATIN 100000 UNIT/GM EX POWD: 1.0000 "application " | Freq: Three times a day (TID) | CUTANEOUS | 0 refills | Status: DC

## 2021-12-21 MED ORDER — FLUCONAZOLE 150 MG PO TABS: 150.0000 mg | ORAL_TABLET | ORAL | 0 refills | Status: AC

## 2021-12-21 NOTE — Progress Notes (Signed)
   Acute Office Visit  Subjective:     Patient ID: Cassie Hunter, female    DOB: 08-26-1955, 66 y.o.   MRN: [id number]  Chief Complaint  Patient presents with   Foot Ulcer    HPI Patient is in today for a foot rash. It is between her last 3 toes. It is red. She is unsure how long this has been there due to her neuropathy but she noticed redness and discomfort last night. There has been some clear drainage. She has DM as well as nerve damage in this foot from a previous leg surgery. She denies fever or chills. She did try a medicated powder last night. She was previously seen by podiatry but she has not been seen recently. She does wear closed toe shoes and socks at all times.   ROS As per HPI.      Objective:    BP 136/74   Temp 98.5 F (36.9 C) (Temporal)   Ht 5\' 3"  (1.6 m)   Wt 151 lb 4 oz (68.6 kg)   SpO2 99%   BMI 26.79 kg/m    Physical Exam Vitals and nursing note reviewed.  Constitutional:      General: She is not in acute distress.    Appearance: She is not ill-appearing, toxic-appearing or diaphoretic.  Pulmonary:     Effort: Pulmonary effort is normal. No respiratory distress.  Feet:     Left foot:     Skin integrity: Skin breakdown (tinea pedis present with erythema and maceration. No purulent drainage, warmth, or tenderness.) and callus (great toe) present.     Toenail Condition: Left toenails are abnormally thick. Fungal disease present. Skin:    General: Skin is warm and dry.  Neurological:     Mental Status: She is alert and oriented to person, place, and time.  Psychiatric:        Mood and Affect: Mood normal.        Behavior: Behavior normal.     No results found for any visits on 12/21/21.      Assessment & Plan:   [place]one was seen today for foot ulcer.  Diagnoses and all orders for this visit:  Type 2 diabetes mellitus with hyperglycemia, without long-term current use of insulin ([place]) With neuropathy. Discussed foot care. Will  place referral back to podiatry for T2DM with calluses, nail fungus, and tinea pedia.  -     Ambulatory referral to Podiatry  Athlete's foot on left -     fluconazole (DIFLUCAN) 150 MG tablet; Take 1 tablet (150 mg total) by mouth once a week for 4 doses. -     nystatin (MYCOSTATIN/NYSTOP) powder; Apply 1 application  topically 3 (three) times daily. -     Ambulatory referral to Podiatry  Foot callus -     Ambulatory referral to Podiatry  Nail fungus -     Ambulatory referral to Podiatry  Hypertension BP slightly elevated today. Monitor BP at home and notify if elevated.   Return if symptoms worsen or fail to improve.  The patient indicates understanding of these issues and agrees with the plan.  Gwenlyn Perking, FNP

## 2021-12-26 ENCOUNTER — Telehealth: Payer: Self-pay

## 2021-12-26 NOTE — Telephone Encounter (Signed)
Nystatin 100000 UNIT/GM powder  Out come Approved on June 13 [place] Case: [id number], Status: Approved, Coverage Starts on: 07/15/2021 12:00:00 AM, Coverage Ends on: 07/14/2022 12:00:00 AM. Questions? Contact [phone].  Pharmacy aware

## 2022-01-02 ENCOUNTER — Ambulatory Visit: Payer: Medicare HMO | Admitting: Podiatry

## 2022-01-09 ENCOUNTER — Other Ambulatory Visit: Payer: Self-pay | Admitting: Physician Assistant

## 2022-01-09 DIAGNOSIS — R10819 Abdominal tenderness, unspecified site: Secondary | ICD-10-CM | POA: Diagnosis not present

## 2022-01-09 DIAGNOSIS — R634 Abnormal weight loss: Secondary | ICD-10-CM

## 2022-01-09 DIAGNOSIS — Z8719 Personal history of other diseases of the digestive system: Secondary | ICD-10-CM | POA: Diagnosis not present

## 2022-01-09 DIAGNOSIS — R1310 Dysphagia, unspecified: Secondary | ICD-10-CM | POA: Diagnosis not present

## 2022-01-09 DIAGNOSIS — D649 Anemia, unspecified: Secondary | ICD-10-CM | POA: Diagnosis not present

## 2022-01-09 DIAGNOSIS — I471 Supraventricular tachycardia: Secondary | ICD-10-CM | POA: Diagnosis not present

## 2022-01-09 DIAGNOSIS — R6881 Early satiety: Secondary | ICD-10-CM | POA: Diagnosis not present

## 2022-01-09 DIAGNOSIS — E538 Deficiency of other specified B group vitamins: Secondary | ICD-10-CM | POA: Diagnosis not present

## 2022-01-09 DIAGNOSIS — R10811 Right upper quadrant abdominal tenderness: Secondary | ICD-10-CM | POA: Diagnosis not present

## 2022-01-09 DIAGNOSIS — R10816 Epigastric abdominal tenderness: Secondary | ICD-10-CM | POA: Diagnosis not present

## 2022-01-09 DIAGNOSIS — R10813 Right lower quadrant abdominal tenderness: Secondary | ICD-10-CM | POA: Diagnosis not present

## 2022-01-11 ENCOUNTER — Encounter: Payer: Self-pay | Admitting: Family Medicine

## 2022-01-14 ENCOUNTER — Other Ambulatory Visit: Payer: Self-pay | Admitting: Family Medicine

## 2022-01-14 DIAGNOSIS — E1165 Type 2 diabetes mellitus with hyperglycemia: Secondary | ICD-10-CM

## 2022-01-16 ENCOUNTER — Encounter: Payer: Self-pay | Admitting: Family Medicine

## 2022-01-21 ENCOUNTER — Ambulatory Visit: Payer: Medicare HMO | Admitting: Podiatry

## 2022-01-21 DIAGNOSIS — M2142 Flat foot [pes planus] (acquired), left foot: Secondary | ICD-10-CM | POA: Diagnosis not present

## 2022-01-21 DIAGNOSIS — E0843 Diabetes mellitus due to underlying condition with diabetic autonomic (poly)neuropathy: Secondary | ICD-10-CM

## 2022-01-21 NOTE — Progress Notes (Signed)
   HPI: 66 y.o. female presenting today as a referral from her PCP for evaluation of discoloration with thickening to the left hallux nail plate.  Patient states that over the past few years she has noticed thickening and discoloration to the left great toenail.  Denies a history of injury.  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  She says it is increasingly becoming more symptomatic and she has tried hard insoles in the past that are very uncomfortable.  She presents for further treatment and evaluation  Past Medical History:  Diagnosis Date   Diabetes mellitus    Difficulty swallowing    Diverticulitis    Essential hypertension    GERD (gastroesophageal reflux disease)    History of esophageal stricture     Past Surgical History:  Procedure Laterality Date   ABDOMINAL HYSTERECTOMY     APPENDECTOMY     CESAREAN SECTION  02/26/1988-07/01/1990   CHOLECYSTECTOMY     SPINAL CORD DECOMPRESSION  1996   TUBAL LIGATION  07/01/1990    Allergies  Allergen Reactions   Codeine Nausea Only and Other (See Comments)   Neosporin [Bacitracin-Polymyxin B] Rash     Physical Exam: General: The patient is alert and oriented x3 in no acute distress.  Dermatology: Skin is warm, dry and supple bilateral lower extremities. Negative for open lesions or macerations.  Hyperkeratotic dystrophic discolored toenail noted to the left hallux nail plate consistent with findings of onychomycosis  Vascular: Palpable pedal pulses bilaterally. Capillary refill within normal limits.  Negative for any significant edema or erythema  Neurological: Light touch and protective threshold diminished  Musculoskeletal Exam: Symptomatic pes planovalgus deformity noted with PTTD left lower extremity.   Assessment: 1.  Onychomycosis of toenail left hallux nail plate 2.  Pes planovalgus / PTTD LLE 3.  Diabetes mellitus with peripheral polyneuropathy   Plan of Care:   1. Patient evaluated.  2.  Today we discussed different treatment options for onychomycosis of the toenail.  Including both oral, topical, and laser antifungal treatment modalities. 3.  Formula 3 antifungal topical dispensed at checkout 4.  Appointment for diabetic shoes and insoles fitting/casting 5.  Return to clinic annually     Felecia Shelling, DPM [facility]  Dr. Felecia Shelling, DPM    [address] [place], [place] [address]                Office [phone]  Fax [phone]

## 2022-01-22 ENCOUNTER — Encounter: Payer: Self-pay | Admitting: Family Medicine

## 2022-01-29 ENCOUNTER — Other Ambulatory Visit: Payer: Medicare HMO

## 2022-02-06 ENCOUNTER — Ambulatory Visit
Admission: RE | Admit: 2022-02-06 | Discharge: 2022-02-06 | Disposition: A | Discharge status: Home / Self Care | Payer: Medicare HMO | Source: Ambulatory Visit | Attending: Physician Assistant | Admitting: Physician Assistant

## 2022-02-06 DIAGNOSIS — K5730 Diverticulosis of large intestine without perforation or abscess without bleeding: Secondary | ICD-10-CM | POA: Diagnosis not present

## 2022-02-06 DIAGNOSIS — R634 Abnormal weight loss: Secondary | ICD-10-CM

## 2022-02-06 DIAGNOSIS — K449 Diaphragmatic hernia without obstruction or gangrene: Secondary | ICD-10-CM | POA: Diagnosis not present

## 2022-02-06 DIAGNOSIS — R10819 Abdominal tenderness, unspecified site: Secondary | ICD-10-CM

## 2022-02-06 DIAGNOSIS — N281 Cyst of kidney, acquired: Secondary | ICD-10-CM | POA: Diagnosis not present

## 2022-02-06 MED ORDER — IOPAMIDOL (ISOVUE-300) INJECTION 61%
100.0000 mL | Freq: Once | INTRAVENOUS | Status: AC | PRN
  Administered 2022-02-06: 100 mL via INTRAVENOUS

## 2022-02-07 ENCOUNTER — Encounter: Payer: Self-pay | Admitting: Family Medicine

## 2022-02-11 ENCOUNTER — Encounter: Payer: Self-pay | Admitting: Family Medicine

## 2022-02-11 ENCOUNTER — Ambulatory Visit (INDEPENDENT_AMBULATORY_CARE_PROVIDER_SITE_OTHER): Payer: Medicare HMO

## 2022-02-11 DIAGNOSIS — M2142 Flat foot [pes planus] (acquired), left foot: Secondary | ICD-10-CM

## 2022-02-11 DIAGNOSIS — E0843 Diabetes mellitus due to underlying condition with diabetic autonomic (poly)neuropathy: Secondary | ICD-10-CM

## 2022-02-11 NOTE — Progress Notes (Signed)
Patient presents to the office today for diabetic shoe and insole measuring.  Patient was measured with brannock device to determine size and width for 1 pair of extra depth shoes and foam casted for 3 pair of insoles.   Documentation of medical necessity will be sent to patient's treating diabetic doctor to verify and sign.   Patient's diabetic provider: Deliah Boston, FNP  Shoes and insoles will be ordered at that time and patient will be notified for an appointment for fitting when they arrive.   Shoe size (per patient): 6 medium women's   Brannock measurement: 6.5 medium women's  Patient shoe selection-   1st choice:   [address] [employer] [place]     Shoe size ordered: 6.5 medium women's

## 2022-02-19 ENCOUNTER — Encounter: Payer: Self-pay | Admitting: Family Medicine

## 2022-02-19 ENCOUNTER — Ambulatory Visit (INDEPENDENT_AMBULATORY_CARE_PROVIDER_SITE_OTHER): Payer: Medicare HMO | Admitting: Family Medicine

## 2022-02-19 VITALS — BP 118/67 | HR 82 | Temp 97.6°F | Resp 20 | Ht 63.0 in | Wt 148.0 lb

## 2022-02-19 DIAGNOSIS — R1319 Other dysphagia: Secondary | ICD-10-CM | POA: Diagnosis not present

## 2022-02-19 DIAGNOSIS — I471 Supraventricular tachycardia: Secondary | ICD-10-CM | POA: Insufficient documentation

## 2022-02-19 DIAGNOSIS — R6881 Early satiety: Secondary | ICD-10-CM | POA: Insufficient documentation

## 2022-02-19 DIAGNOSIS — I700 Atherosclerosis of aorta: Secondary | ICD-10-CM

## 2022-02-19 DIAGNOSIS — K219 Gastro-esophageal reflux disease without esophagitis: Secondary | ICD-10-CM

## 2022-02-19 DIAGNOSIS — E538 Deficiency of other specified B group vitamins: Secondary | ICD-10-CM | POA: Diagnosis not present

## 2022-02-19 DIAGNOSIS — E1165 Type 2 diabetes mellitus with hyperglycemia: Secondary | ICD-10-CM | POA: Diagnosis not present

## 2022-02-19 DIAGNOSIS — I10 Essential (primary) hypertension: Secondary | ICD-10-CM

## 2022-02-19 DIAGNOSIS — E034 Atrophy of thyroid (acquired): Secondary | ICD-10-CM | POA: Diagnosis not present

## 2022-02-19 DIAGNOSIS — R634 Abnormal weight loss: Secondary | ICD-10-CM | POA: Insufficient documentation

## 2022-02-19 LAB — BAYER DCA HB A1C WAIVED: HB A1C (BAYER DCA - WAIVED): 6 % — ABNORMAL HIGH (ref 4.8–5.6)

## 2022-02-19 MED ORDER — SIMVASTATIN 20 MG PO TABS: 20.0000 mg | ORAL_TABLET | Freq: Every day | ORAL | 1 refills | Status: DC

## 2022-02-19 NOTE — Patient Instructions (Signed)
Please schedule your eye exam.

## 2022-02-19 NOTE — Progress Notes (Signed)
Assessment & Plan:  1. Type 2 diabetes mellitus with hyperglycemia, without long-term current use of insulin (HCC) Lab Results  Component Value Date   HGBA1C 6.0 (H) 02/19/2022   HGBA1C 6.1 (H) 08/21/2021   HGBA1C 6.4 (H) 05/15/2021    - Diabetes is at goal of A1c < 7. - Medications: continue current medications - Home glucose monitoring: continue monitoring - Patient is currently taking a statin. Patient is taking an ACE-inhibitor/ARB.  - Instruction/counseling given: reminded to get eye exam  Diabetes Health Maintenance Due  Topic Date Due   HEMOGLOBIN A1C  02/18/2022   OPHTHALMOLOGY EXAM  02/21/2022   FOOT EXAM  02/20/2023    Lab Results  Component Value Date   LABMICR 3.9 05/15/2021   LABMICR 19.7 06/22/2019   - CBC with Differential/Platelet - CMP14+EGFR - Lipid panel - Bayer DCA Hb A1c Waived - simvastatin (ZOCOR) 20 MG tablet; Take 1 tablet (20 mg total) by mouth daily at 6 PM.  Dispense: 90 tablet; Refill: 1  2. Essential hypertension, benign Well controlled on current regimen.  - CBC with Differential/Platelet - CMP14+EGFR - Lipid panel  3. Aortic atherosclerosis (HCC) Continue current regimen. - CBC with Differential/Platelet - CMP14+EGFR - Lipid panel - simvastatin (ZOCOR) 20 MG tablet; Take 1 tablet (20 mg total) by mouth daily at 6 PM.  Dispense: 90 tablet; Refill: 1  4. Atrophy of thyroid - Thyroid Panel With TSH  5. Gastroesophageal reflux disease, unspecified whether esophagitis present Keep upcoming appointment for EGD with GI. - CMP14+EGFR  6. Esophageal dysphagia Keep upcoming appointment for EGD with GI.  7. Vitamin B12 deficiency Labs to reassess. - Vitamin B12   Return in about 6 months (around 08/22/2022) for annual physical.  Cassie Limes, MSN, APRN, FNP-C [facility]  Subjective:    Patient ID: Cassie Hunter, female    DOB: [birth year], 66 y.o.   MRN: [id number]  Patient Care Team: Loman Brooklyn, FNP as PCP - General (Family Medicine) Burnell Blanks, MD as PCP - Cardiology (Cardiology) Celestia Khat, [place] (Optometry)   Chief Complaint:  Chief Complaint  Patient presents with   Medical Management of Chronic Issues    6 mo     HPI: Cassie Hunter is a 66 y.o. female presenting on 02/19/2022 for Medical Management of Chronic Issues (6 mo )  Diabetes: Patient presents for follow up of diabetes. Current symptoms include: hyperglycemia. Known diabetic complications: none. Medication compliance: yes. Current diet: in general, a "healthy" diet  . Current exercise: none. Home blood sugar records:  fasting in the 120-140s and up to 160s if she doesn't eat good . Is she  on ACE inhibitor or angiotensin II receptor blocker? Yes (Losartan). Is she on a statin? Yes (Simvastatin). Last urine microalbumin/creatinine ratio completed in November 2022. She is seeing a podiatrist and is getting shoes made to feet her feet.   Vitamin B12 Deficiency: advised to start OTC vitamin B12 supplement in February, which she reports she took until she ran out. She forgot to pick up a new bottle when she had money again.  Hypertension: patient does not check her blood pressure at home. She does not add salt to her food.  Aortic Atherosclerosis: taking simvastatin and aspirin daily.   Difficulty Swallowing/GERD: patient is scheduled for an EGD and colonoscopy in October. She has been cleared by cardiology. She is taking omeprazole 40 mg daily.   New complaints: None   Social history:  Relevant past  medical, surgical, family and social history reviewed and updated as indicated. Interim medical history since our last visit reviewed.  Allergies and medications reviewed and updated.  DATA REVIEWED: CHART IN EPIC  ROS: Negative unless specifically indicated above in HPI.    Current Outpatient Medications:    albuterol (VENTOLIN HFA) 108 (90 Base) MCG/ACT inhaler, Inhale 2 puffs into  the lungs every 6 (six) hours as needed., Disp: 18 g, Rfl: 2   Alcohol Swabs (B-D SINGLE USE SWABS REGULAR) PADS, Test BS daily and as needed Dx E11.9, Disp: 100 each, Rfl: 3   aspirin 81 MG EC tablet, Take 1 tablet (81 mg total) by mouth daily. Swallow whole., Disp: 30 tablet, Rfl: 12   Blood Glucose Calibration (TRUE METRIX LEVEL 1) Low SOLN, Use with glucose monitor Dx E11.9, Disp: 3 each, Rfl: 3   diltiazem (CARDIZEM CD) 120 MG 24 hr capsule, Take 1 capsule (120 mg total) by mouth daily., Disp: 90 capsule, Rfl: 3   diltiazem (CARDIZEM) 30 MG tablet, Take 1 tablet (30 mg total) by mouth 4 (four) times daily as needed., Disp: 60 tablet, Rfl: 1   glucose blood (TRUE METRIX BLOOD GLUCOSE TEST) test strip, Test BS daily and as needed Dx E11.9, Disp: 100 each, Rfl: 3   losartan (COZAAR) 100 MG tablet, TAKE 1 TABLET EVERY DAY, Disp: 90 tablet, Rfl: 1   metFORMIN (GLUCOPHAGE) 500 MG tablet, TAKE 2 TABLETS (1,000 MG TOTAL) BY MOUTH DAILY WITH BREAKFAST AND 1 TABLET (500 MG TOTAL) DAILY WITH SUPPER., Disp: 270 tablet, Rfl: 0   naproxen (NAPROSYN) 500 MG tablet, Take 1 tablet (500 mg total) by mouth 2 (two) times daily as needed for moderate pain., Disp: 30 tablet, Rfl: 0   nystatin (MYCOSTATIN/NYSTOP) powder, Apply 1 application  topically 3 (three) times daily., Disp: 15 g, Rfl: 0   omeprazole (PRILOSEC) 40 MG capsule, Take 1 capsule (40 mg total) by mouth 2 (two) times daily before a meal., Disp: 180 capsule, Rfl: 1   ondansetron (ZOFRAN-ODT) 4 MG disintegrating tablet, Take 1 tablet (4 mg total) by mouth every 8 (eight) hours as needed for nausea or vomiting., Disp: 30 tablet, Rfl: 0   simvastatin (ZOCOR) 20 MG tablet, Take 1 tablet (20 mg total) by mouth daily at 6 PM., Disp: 90 tablet, Rfl: 1   TRUEplus Lancets 33G MISC, Test BS daily and as needed Dx E11.9, Disp: 100 each, Rfl: 3   Allergies  Allergen Reactions   Codeine Nausea Only and Other (See Comments)   Neosporin [Bacitracin-Polymyxin B]  Rash   Past Medical History:  Diagnosis Date   Diabetes mellitus    Difficulty swallowing    Diverticulitis    Essential hypertension    GERD (gastroesophageal reflux disease)    History of esophageal stricture     Past Surgical History:  Procedure Laterality Date   ABDOMINAL HYSTERECTOMY     APPENDECTOMY     CESAREAN SECTION  02/26/1988-07/01/1990   CHOLECYSTECTOMY     SPINAL CORD DECOMPRESSION  1996   TUBAL LIGATION  07/01/1990    Social History   Socioeconomic History   Marital status: Married    Spouse name: Fritz Pickerel   Number of children: 2   Years of education: Not on file   Highest education level: Not on file  Occupational History   Occupation: Retired Consulting civil engineer  Tobacco Use   Smoking status: Never   Smokeless tobacco: Never  Vaping Use   Vaping Use: Never used  Substance and  Sexual Activity   Alcohol use: No   Drug use: No   Sexual activity: Not Currently    Birth control/protection: Surgical  Other Topics Concern   Not on file  Social History Narrative   Lives with her husband in one level home   Daughter lives 2 blocks away   Social Determinants of Health   Financial Resource Strain: Low Risk  (12/13/2021)   Overall Financial Resource Strain (CARDIA)    Difficulty of Paying Living Expenses: Not hard at all  Food Insecurity: No Food Insecurity (12/13/2021)   Hunger Vital Sign    Worried About Running Out of Food in the Last Year: Never true    Ran Out of Food in the Last Year: Never true  Transportation Needs: No Transportation Needs (12/13/2021)   PRAPARE - Hydrologist (Medical): No    Lack of Transportation (Non-Medical): No  Physical Activity: Insufficiently Active (12/13/2021)   Exercise Vital Sign    Days of Exercise per Week: 3 days    Minutes of Exercise per Session: 40 min  Stress: No Stress Concern Present (12/13/2021)   [place]    Feeling of  Stress : Not at all  Social Connections: Moderately Integrated (12/13/2021)   Social Connection and Isolation Panel [NHANES]    Frequency of Communication with Friends and Family: More than three times a week    Frequency of Social Gatherings with Friends and Family: Once a week    Attends Religious Services: Never    Marine scientist or Organizations: Yes    Attends Music therapist: More than 4 times per year    Marital Status: Married  Human resources officer Violence: Not At Risk (12/13/2021)   Humiliation, Afraid, Rape, and Kick questionnaire    Fear of Current or Ex-Partner: No    Emotionally Abused: No    Physically Abused: No    Sexually Abused: No        Objective:    BP 118/67   Pulse 82   Temp 97.6 F (36.4 C)   Resp 20   Ht _0  (1.6 m)   Wt 148 lb (67.1 kg)   SpO2 98%   BMI 26.22 kg/m   Wt Readings from Last 3 Encounters:  02/19/22 148 lb (67.1 kg)  12/21/21 151 lb 4 oz (68.6 kg)  12/13/21 151 lb 3.2 oz (68.6 kg)    Physical Exam Vitals reviewed.  Constitutional:      General: She is not in acute distress.    Appearance: Normal appearance. She is overweight. She is not ill-appearing, toxic-appearing or diaphoretic.  HENT:     Head: Normocephalic and atraumatic.  Eyes:     General: No scleral icterus.       Right eye: No discharge.        Left eye: No discharge.     Conjunctiva/sclera: Conjunctivae normal.  Cardiovascular:     Rate and Rhythm: Normal rate and regular rhythm.     Heart sounds: Normal heart sounds. No murmur heard.    No friction rub. No gallop.  Pulmonary:     Effort: Pulmonary effort is normal. No respiratory distress.     Breath sounds: Normal breath sounds. No stridor. No wheezing, rhonchi or rales.  Musculoskeletal:        General: Normal range of motion.     Cervical back: Normal range of motion.  Skin:    General:  Skin is warm and dry.     Capillary Refill: Capillary refill takes less than 2 seconds.   Neurological:     General: No focal deficit present.     Mental Status: She is alert and oriented to person, place, and time. Mental status is at baseline.  Psychiatric:        Mood and Affect: Mood normal.        Behavior: Behavior normal.        Thought Content: Thought content normal.        Judgment: Judgment normal.    Diabetic Foot Exam - Simple   Simple Foot Form Diabetic Foot exam was performed with the following findings: Yes 02/19/2022  9:25 AM  Visual Inspection No deformities, no ulcerations, no other skin breakdown bilaterally: Yes Sensation Testing See comments: Yes Pulse Check Posterior Tibialis and Dorsalis pulse intact bilaterally: Yes Comments Left foot unable to feel monofilament or touch.     Lab Results  Component Value Date   TSH 0.403 (L) 08/21/2021   Lab Results  Component Value Date   WBC 9.1 08/21/2021   HGB 11.8 08/21/2021   HCT 34.9 08/21/2021   MCV 84 08/21/2021   PLT 392 08/21/2021   Lab Results  Component Value Date   NA 134 08/21/2021   K 3.9 08/21/2021   CO2 26 08/21/2021   GLUCOSE 143 (H) 08/21/2021   BUN 16 08/21/2021   CREATININE 0.97 08/21/2021   BILITOT 0.3 08/21/2021   ALKPHOS 79 08/21/2021   AST 14 08/21/2021   ALT 13 08/21/2021   PROT 6.0 08/21/2021   ALBUMIN 4.0 08/21/2021   CALCIUM 9.4 08/21/2021   ANIONGAP 11 08/16/2021   EGFR 64 08/21/2021   Lab Results  Component Value Date   CHOL 171 08/21/2021   Lab Results  Component Value Date   HDL 97 08/21/2021   Lab Results  Component Value Date   LDLCALC 49 08/21/2021   Lab Results  Component Value Date   TRIG 157 (H) 08/21/2021   Lab Results  Component Value Date   CHOLHDL 1.8 08/21/2021   Lab Results  Component Value Date   HGBA1C 6.1 (H) 08/21/2021

## 2022-02-20 ENCOUNTER — Encounter: Payer: Self-pay | Admitting: Family Medicine

## 2022-02-20 DIAGNOSIS — E875 Hyperkalemia: Secondary | ICD-10-CM

## 2022-02-20 LAB — CBC WITH DIFFERENTIAL/PLATELET
Basophils Absolute: 0 10*3/uL (ref 0.0–0.2)
Basos: 1 %
EOS (ABSOLUTE): 0.4 10*3/uL (ref 0.0–0.4)
Eos: 6 %
Hematocrit: 34.9 % (ref 34.0–46.6)
Hemoglobin: 11.8 g/dL (ref 11.1–15.9)
Immature Grans (Abs): 0 10*3/uL (ref 0.0–0.1)
Immature Granulocytes: 0 %
Lymphocytes Absolute: 1.2 10*3/uL (ref 0.7–3.1)
Lymphs: 21 %
MCH: 28.7 pg (ref 26.6–33.0)
MCHC: 33.8 g/dL (ref 31.5–35.7)
MCV: 85 fL (ref 79–97)
Monocytes Absolute: 0.4 10*3/uL (ref 0.1–0.9)
Monocytes: 7 %
Neutrophils Absolute: 3.8 10*3/uL (ref 1.4–7.0)
Neutrophils: 65 %
Platelets: 354 10*3/uL (ref 150–450)
RBC: 4.11 x10E6/uL (ref 3.77–5.28)
RDW: 12.5 % (ref 11.7–15.4)
WBC: 5.9 10*3/uL (ref 3.4–10.8)

## 2022-02-20 LAB — CMP14+EGFR
ALT: 7 IU/L (ref 0–32)
AST: 13 IU/L (ref 0–40)
Albumin/Globulin Ratio: 1.7 (ref 1.2–2.2)
Albumin: 4.1 g/dL (ref 3.9–4.9)
Alkaline Phosphatase: 91 IU/L (ref 44–121)
BUN/Creatinine Ratio: 16 (ref 12–28)
BUN: 14 mg/dL (ref 8–27)
Bilirubin Total: 0.4 mg/dL (ref 0.0–1.2)
CO2: 25 mmol/L (ref 20–29)
Calcium: 9.8 mg/dL (ref 8.7–10.3)
Chloride: 99 mmol/L (ref 96–106)
Creatinine, Ser: 0.86 mg/dL (ref 0.57–1.00)
Globulin, Total: 2.4 g/dL (ref 1.5–4.5)
Glucose: 137 mg/dL — ABNORMAL HIGH (ref 70–99)
Potassium: 5.4 mmol/L — ABNORMAL HIGH (ref 3.5–5.2)
Sodium: 138 mmol/L (ref 134–144)
Total Protein: 6.5 g/dL (ref 6.0–8.5)
eGFR: 74 mL/min/{1.73_m2} (ref >59)

## 2022-02-20 LAB — LIPID PANEL
Chol/HDL Ratio: 2.2 ratio (ref 0.0–4.4)
Cholesterol, Total: 199 mg/dL (ref 100–199)
HDL: 90 mg/dL (ref >39)
LDL Chol Calc (NIH): 90 mg/dL (ref 0–99)
Triglycerides: 110 mg/dL (ref 0–149)
VLDL Cholesterol Cal: 19 mg/dL (ref 5–40)

## 2022-02-20 LAB — THYROID PANEL WITH TSH
Free Thyroxine Index: 2.4 (ref 1.2–4.9)
T3 Uptake Ratio: 28 % (ref 24–39)
T4, Total: 8.4 ug/dL (ref 4.5–12.0)
TSH: 1.29 u[IU]/mL (ref 0.450–4.500)

## 2022-02-20 LAB — VITAMIN B12: Vitamin B-12: 464 pg/mL (ref 232–1245)

## 2022-03-04 ENCOUNTER — Ambulatory Visit (INDEPENDENT_AMBULATORY_CARE_PROVIDER_SITE_OTHER): Payer: Medicare HMO | Admitting: Nurse Practitioner

## 2022-03-04 ENCOUNTER — Encounter: Payer: Self-pay | Admitting: Nurse Practitioner

## 2022-03-04 VITALS — BP 145/77 | HR 81 | Temp 98.6°F | Ht 63.0 in | Wt 148.6 lb

## 2022-03-04 DIAGNOSIS — T63461A Toxic effect of venom of wasps, accidental (unintentional), initial encounter: Secondary | ICD-10-CM | POA: Diagnosis not present

## 2022-03-04 DIAGNOSIS — E875 Hyperkalemia: Secondary | ICD-10-CM

## 2022-03-04 LAB — BMP8+EGFR
BUN/Creatinine Ratio: 13 (ref 12–28)
BUN: 12 mg/dL (ref 8–27)
CO2: 22 mmol/L (ref 20–29)
Calcium: 9.5 mg/dL (ref 8.7–10.3)
Chloride: 101 mmol/L (ref 96–106)
Creatinine, Ser: 0.94 mg/dL (ref 0.57–1.00)
Glucose: 174 mg/dL — ABNORMAL HIGH (ref 70–99)
Potassium: 4.4 mmol/L (ref 3.5–5.2)
Sodium: 139 mmol/L (ref 134–144)
eGFR: 67 mL/min/{1.73_m2} (ref >59)

## 2022-03-04 MED ORDER — PREDNISONE 10 MG PO TABS: 10.0000 mg | ORAL_TABLET | Freq: Every day | ORAL | 0 refills | Status: DC

## 2022-03-04 MED ORDER — METHYLPREDNISOLONE ACETATE 40 MG/ML IJ SUSP
40.0000 mg | Freq: Once | INTRAMUSCULAR | Status: AC
  Administered 2022-03-04: 40 mg via INTRAMUSCULAR

## 2022-03-04 MED ORDER — HYDROCORTISONE 1 % EX LOTN: 1.0000 | TOPICAL_LOTION | Freq: Two times a day (BID) | CUTANEOUS | 0 refills | Status: DC

## 2022-03-04 NOTE — Patient Instructions (Signed)
Bee, Wasp, or [employer], Adult Bees, wasps, and hornets are part of a family of insects that sting. Normally, a sting will cause pain, redness, and swelling at the sting site. However, some people have an allergy to these stings, and their reactions can be much more serious. What increases the risk? You may be at a greater risk of getting stung if you: Provoke a stinging insect by swatting or disturbing it. Wear strong-smelling soaps, deodorants, or body sprays. Spend time outdoors near gardens with flowers or fruit trees or in clothes that expose skin. Eat or drink outside. What are the signs or symptoms? The reaction to an insect sting can vary from a mild, normal response to life-threatening anaphylaxis. The sting site is often a red lump in the skin, sometimes with a tiny hole in the center, that may still have the stinger in the center of the wound. Normal reaction A normal reaction is experienced by most people after an insect sting. Symptoms include: Pain, redness, and swelling at the sting site. These can develop over 24-48 hours. Pain, redness, and swelling that may spread to a larger, connected area beyond the sting site. The spreading can continue over 24-48 hours. Allergic reaction An allergic reaction can vary in severity and includes symptoms in other areas of the body beyond the sting site. People who experience an allergic reaction have a higher risk of having similar or worse symptoms the next time they are stung. Symptoms may include: Hives, itching, and swelling. Abdominal symptoms including cramping, nausea, vomiting, and diarrhea. Severe symptoms that require immediate medical attention include: Chest pain or tightness. Wheezing or trouble breathing. Swelling of the tongue, throat, or lips. Trouble swallowing or hoarse voice. Anaphylactic reaction An anaphylactic reaction is a severe, life-threatening allergy and requires immediate medical attention. The symptoms often  include severe allergic reaction symptoms that develop rapidly and lead to: A sudden and sharp drop in blood pressure. Dizziness. Loss of consciousness. How is this diagnosed? This condition is usually diagnosed based on your symptoms and medical history as well as a physical exam. You may have an allergy test to determine if you are allergic to the insect venom. How is this treated? If you were stung by a bee, the stinger and a small sac of venom may be in the wound. Remove the stinger as soon as possible. Do this by brushing across the wound with gauze, a clean fingernail, or a flat card such as a credit card. This can help reduce the severity of your body's reaction to the sting. Normal reactions can be treated with: Washing the area thoroughly with soap and water. Applying ice to the area to reduce swelling. Oral or topical medicines to help reduce pain and itching, if present. Pay close attention to your symptoms after you have been stung. If possible, have someone stay with you to see if an allergic reaction develops. If allergic symptoms develop, oral antihistamines can be taken and you will need medical help right away. If you had an allergic reaction before, you may need to: Use an auto-injector "pen"(pre-filled automatic epinephrine injection device)at the first sign of an allergic reaction. Get medical help right away because the epinephrine is short-acting. It is intended to give you more time to get to an emergency room. Follow these instructions at home:  Wash the sting site 2-3 times a day with soap and water as told by your health care provider. Apply or take over-the-counter and prescription medicines only as told  by your health care provider. If directed, apply ice to the sting area. Put ice in a plastic bag. Place a towel between your skin and the bag. Leave the ice on for 20 minutes, 2-3 times a day. Do not scratch the sting area. If you had a severe allergic reaction to  a sting, you may need to: Wear a medical bracelet or necklace that lists the allergy. Carry an anaphylaxis kit or an epinephrine auto-injector "pen" with you at all times. Tell your family members, friends, and coworkers when and how to use it. Use it at the first sign of an allergic reaction. How is this prevented? Avoid swatting at stinging insects and disturbing insect nests. Do not use fragrant soaps or lotions and avoid sitting near flowering plants, if possible. Wear shoes, pants, and long sleeves when spending time outdoors, especially in grassy areas where stinging insects are common. Keep outdoor areas free from nests or hives. Keep food and drink containers covered when eating outdoors. Wear gloves if you are gardening or working outdoors. Find a barrier between you and the insect(s), such as a door, if an attack by a stinging insect or a swarm seems likely. Contact a health care provider if: Your symptoms do not get better in 2-3 days. You have redness, swelling, or pain that spreads beyond the area of the sting. You have a fever. Get help right away if: You have symptoms of a severe allergic reaction. These include: Chest tightness or pain. Wheezing, or trouble swallowing or breathing. Light-headedness, dizziness, or fainting. Itchy, raised, red patches on the skin beyond the sting site. Abdominal cramping, nausea, vomiting, or diarrhea. Trouble swallowing or a swollen tongue, throat, or lips. These symptoms may be an emergency. Get help right away. Call 911. Do not wait to see if the symptoms will go away. Do not drive yourself to the hospital. Summary Stings from bees, wasps, and hornets can cause pain and swelling, but they are usually not serious. However, some people may have an allergic reaction to a sting. This can cause the symptoms to be more severe. Pay close attention to your symptoms after you have been stung. If possible, have someone stay with you to look for  signs of worsening symptoms. Call your health care provider if you have any signs of an allergic reaction. This information is not intended to replace advice given to you by your health care provider. Make sure you discuss any questions you have with your health care provider. Document Revised: 08/28/2021 Document Reviewed: 08/28/2021 Elsevier Patient Education  2023 Elsevier Inc.  

## 2022-03-04 NOTE — Progress Notes (Signed)
Acute Office Visit  Subjective:     Patient ID: Cassie Hunter, female    DOB: Dec 02, 1955, 66 y.o.   MRN: [id number]  Chief Complaint  Patient presents with   Insect Bite    Stung Saturday by 4    HPI Patient is in today for assessment of rash and swelling from wasp sting in the past 2 to 3 days.  Patient denies fever, headache, nausea and belly pain.  Ice pack and topical Benadryl has provided some relief.  Review of Systems  Constitutional: Negative.  Negative for chills, fever and malaise/fatigue.  HENT: Negative.    Respiratory: Negative.    Cardiovascular: Negative.   Gastrointestinal: Negative.   Genitourinary: Negative.   Skin:  Positive for itching and rash.  All other systems reviewed and are negative.       Objective:    BP (!) 145/77   Pulse 81   Temp 98.6 F (37 C)   Ht 5\' 3"  (1.6 m)   Wt 148 lb 9.6 oz (67.4 kg)   SpO2 99%   BMI 26.32 kg/m  BP Readings from Last 3 Encounters:  03/04/22 (!) 145/77  02/19/22 118/67  12/21/21 136/74   Wt Readings from Last 3 Encounters:  03/04/22 148 lb 9.6 oz (67.4 kg)  02/19/22 148 lb (67.1 kg)  12/21/21 151 lb 4 oz (68.6 kg)      Physical Exam Vitals and nursing note reviewed.  Constitutional:      Appearance: Normal appearance.  HENT:     Right Ear: External ear normal. There is no impacted cerumen.     Left Ear: External ear normal.     Nose: Nose normal.     Mouth/Throat:     Pharynx: Oropharynx is clear.  Cardiovascular:     Rate and Rhythm: Normal rate and regular rhythm.     Pulses: Normal pulses.     Heart sounds: Normal heart sounds.  Pulmonary:     Effort: Pulmonary effort is normal.     Breath sounds: Normal breath sounds.  Abdominal:     General: Bowel sounds are normal.  Skin:    General: Skin is warm.     Findings: No erythema or rash.  Neurological:     General: No focal deficit present.     Mental Status: She is alert and oriented to person, place, and time.     No  results found for any visits on 03/04/22.      Assessment & Plan:  Patient was advised to his presenting cellulitis and inflammation/erythematous edema.  I will treat patient's for today as inflammation from wasp sting. Provided education to patient to watch for signs and symptoms of infection, increased redness and inflammation.  At that point we will treat as cellulitis. Take all medication as prescribed, Depo-Medrol shot given in clinic, 10 mg prednisone given daily for 6 days.  Over-the-counter topical hydrocortisone cream 1%.  Follow-up for worsening or unresolved symptoms. Problem List Items Addressed This Visit   None Visit Diagnoses     Wasp sting, accidental or unintentional, initial encounter    -  Primary   Relevant Medications   predniSONE (DELTASONE) 10 MG tablet   hydrocortisone 1 % lotion   methylPREDNISolone acetate (DEPO-MEDROL) injection 40 mg (Completed)   Hyperkalemia           Meds ordered this encounter  Medications   predniSONE (DELTASONE) 10 MG tablet    Sig: Take 1 tablet (10 mg total) by  mouth daily with breakfast.    Dispense:  6 tablet    Refill:  0    Order Specific Question:   Supervising Provider    Answer:   Standley Brooking   hydrocortisone 1 % lotion    Sig: Apply 1 Application topically 2 (two) times daily.    Dispense:  118 mL    Refill:  0    Order Specific Question:   Supervising Provider    Answer:   Standley Brooking   methylPREDNISolone acetate (DEPO-MEDROL) injection 40 mg    Return if symptoms worsen or fail to improve.  Daryll Drown, NP

## 2022-03-20 ENCOUNTER — Encounter: Payer: Self-pay | Admitting: Family Medicine

## 2022-03-20 ENCOUNTER — Ambulatory Visit (INDEPENDENT_AMBULATORY_CARE_PROVIDER_SITE_OTHER): Payer: Medicare HMO

## 2022-03-20 ENCOUNTER — Ambulatory Visit (INDEPENDENT_AMBULATORY_CARE_PROVIDER_SITE_OTHER): Payer: Medicare HMO | Admitting: Family Medicine

## 2022-03-20 VITALS — BP 145/65 | HR 84 | Temp 97.4°F | Ht 63.0 in | Wt 146.0 lb

## 2022-03-20 DIAGNOSIS — W19XXXA Unspecified fall, initial encounter: Secondary | ICD-10-CM

## 2022-03-20 DIAGNOSIS — M25522 Pain in left elbow: Secondary | ICD-10-CM

## 2022-03-20 DIAGNOSIS — S59902A Unspecified injury of left elbow, initial encounter: Secondary | ICD-10-CM

## 2022-03-21 ENCOUNTER — Encounter: Payer: Self-pay | Admitting: Family Medicine

## 2022-03-21 NOTE — Progress Notes (Signed)
Subjective:  Patient ID: Cassie Hunter, female    DOB: 12-Sep-1955  Age: 66 y.o. MRN: [id number]  CC: Fall   HPI Cassie Hunter presents for fell and caught herself on the left forearm. Now having pain at the elbow Denies hitting head. No LOC.      03/20/2022   11:25 AM 03/04/2022    9:54 AM 02/19/2022    9:01 AM  Depression screen PHQ 2/9  Decreased Interest 0 0 0  Down, Depressed, Hopeless 0 0 0  PHQ - 2 Score 0 0 0    History Cassie Hunter has a past medical history of Diabetes mellitus, Difficulty swallowing, Diverticulitis, Essential hypertension, GERD (gastroesophageal reflux disease), and History of esophageal stricture.   She has a past surgical history that includes Appendectomy; Cholecystectomy; Abdominal hysterectomy; Spinal cord decompression (1996); Cesarean section (02/26/1988-07/01/1990); and Tubal ligation (07/01/1990).   Her family history includes ADD / ADHD in her son; Aneurysm in her paternal grandfather; Asthma in her son; CVA in her mother; Cancer in her father; Heart attack in her maternal grandfather and maternal grandmother; Lung cancer in her father; Pancreatic cancer in her father; Spina bifida in her sister; Stroke in her mother and paternal grandmother.She reports that she has never smoked. She has never used smokeless tobacco. She reports that she does not drink alcohol and does not use drugs.    ROS Review of Systems noncontributory Objective:  BP (!) 145/65   Pulse 84   Temp (!) 97.4 F (36.3 C)   Ht 5\' 3"  (1.6 m)   Wt 146 lb (66.2 kg)   SpO2 99%   BMI 25.86 kg/m   BP Readings from Last 3 Encounters:  03/20/22 (!) 145/65  03/04/22 (!) 145/77  02/19/22 118/67    Wt Readings from Last 3 Encounters:  03/20/22 146 lb (66.2 kg)  03/04/22 148 lb 9.6 oz (67.4 kg)  02/19/22 148 lb (67.1 kg)     Physical Exam  Tender at the origin of the left forearm extensors. FROM for supination, pronation and flexion. XR - no fx noted (prelim by  Tylisa Alcivar.  Assessment & Plan:   Cassie Hunter was seen today for fall.  Diagnoses and all orders for this visit:  Fall, initial encounter -     DG Elbow 2 Views Left; Future  Elbow injury, left, initial encounter    Wear sling, use naproxyn as needed for comfort   I am having 08-10-1971 maintain her True Metrix Level 1, B-D SINGLE USE SWABS REGULAR, True Metrix Blood Glucose Test, TRUEplus Lancets 33G, albuterol, aspirin EC, diltiazem, diltiazem, losartan, naproxen, omeprazole, ondansetron, nystatin, metFORMIN, simvastatin, Ascorbic Acid (VITAMIN C PO), ferrous sulfate, predniSONE, and hydrocortisone.  Allergies as of 03/20/2022       Reactions   Codeine Nausea Only, Other (See Comments)   Neosporin [bacitracin-polymyxin B] Rash        Medication List        Accurate as of March 20, 2022 11:59 PM. If you have any questions, ask your nurse or doctor.          albuterol 108 (90 Base) MCG/ACT inhaler Commonly known as: VENTOLIN HFA Inhale 2 puffs into the lungs every 6 (six) hours as needed.   aspirin EC 81 MG tablet Take 1 tablet (81 mg total) by mouth daily. Swallow whole.   B-D SINGLE USE SWABS REGULAR Pads Test BS daily and as needed Dx E11.9   diltiazem 120 MG 24 hr capsule Commonly known as:  CARDIZEM CD Take 1 capsule (120 mg total) by mouth daily.   diltiazem 30 MG tablet Commonly known as: Cardizem Take 1 tablet (30 mg total) by mouth 4 (four) times daily as needed.   ferrous sulfate 325 (65 FE) MG tablet Take 325 mg by mouth daily.   hydrocortisone 1 % lotion Apply 1 Application topically 2 (two) times daily.   losartan 100 MG tablet Commonly known as: COZAAR TAKE 1 TABLET EVERY DAY   metFORMIN 500 MG tablet Commonly known as: GLUCOPHAGE TAKE 2 TABLETS (1,000 MG TOTAL) BY MOUTH DAILY WITH BREAKFAST AND 1 TABLET (500 MG TOTAL) DAILY WITH SUPPER.   naproxen 500 MG tablet Commonly known as: Naprosyn Take 1 tablet (500 mg total) by mouth 2 (two)  times daily as needed for moderate pain.   nystatin powder Commonly known as: MYCOSTATIN/NYSTOP Apply 1 application  topically 3 (three) times daily.   omeprazole 40 MG capsule Commonly known as: PRILOSEC Take 1 capsule (40 mg total) by mouth 2 (two) times daily before a meal.   ondansetron 4 MG disintegrating tablet Commonly known as: ZOFRAN-ODT Take 1 tablet (4 mg total) by mouth every 8 (eight) hours as needed for nausea or vomiting.   predniSONE 10 MG tablet Commonly known as: DELTASONE Take 1 tablet (10 mg total) by mouth daily with breakfast.   simvastatin 20 MG tablet Commonly known as: ZOCOR Take 1 tablet (20 mg total) by mouth daily at 6 PM.   True Metrix Blood Glucose Test test strip Generic drug: glucose blood Test BS daily and as needed Dx E11.9   True Metrix Level 1 Low Soln Use with glucose monitor Dx E11.9   TRUEplus Lancets 33G Misc Test BS daily and as needed Dx E11.9   VITAMIN C PO Take by mouth daily.         Follow-up: No follow-ups on file.  Mechele Claude, M.D.

## 2022-04-09 ENCOUNTER — Ambulatory Visit (INDEPENDENT_AMBULATORY_CARE_PROVIDER_SITE_OTHER): Payer: Medicare HMO

## 2022-04-09 DIAGNOSIS — Z23 Encounter for immunization: Secondary | ICD-10-CM | POA: Diagnosis not present

## 2022-04-10 ENCOUNTER — Ambulatory Visit: Payer: Medicare HMO

## 2022-04-11 DIAGNOSIS — H524 Presbyopia: Secondary | ICD-10-CM | POA: Diagnosis not present

## 2022-04-18 ENCOUNTER — Other Ambulatory Visit: Payer: Self-pay | Admitting: Family Medicine

## 2022-04-18 DIAGNOSIS — E1165 Type 2 diabetes mellitus with hyperglycemia: Secondary | ICD-10-CM

## 2022-04-18 DIAGNOSIS — I10 Essential (primary) hypertension: Secondary | ICD-10-CM

## 2022-05-06 DIAGNOSIS — D509 Iron deficiency anemia, unspecified: Secondary | ICD-10-CM | POA: Diagnosis not present

## 2022-05-06 DIAGNOSIS — R1310 Dysphagia, unspecified: Secondary | ICD-10-CM | POA: Diagnosis not present

## 2022-05-06 DIAGNOSIS — K648 Other hemorrhoids: Secondary | ICD-10-CM | POA: Diagnosis not present

## 2022-05-06 DIAGNOSIS — K222 Esophageal obstruction: Secondary | ICD-10-CM | POA: Diagnosis not present

## 2022-05-06 DIAGNOSIS — K5730 Diverticulosis of large intestine without perforation or abscess without bleeding: Secondary | ICD-10-CM | POA: Diagnosis not present

## 2022-05-06 DIAGNOSIS — R634 Abnormal weight loss: Secondary | ICD-10-CM | POA: Diagnosis not present

## 2022-05-29 ENCOUNTER — Other Ambulatory Visit: Payer: Self-pay | Admitting: Family Medicine

## 2022-05-29 DIAGNOSIS — R1319 Other dysphagia: Secondary | ICD-10-CM

## 2022-06-03 ENCOUNTER — Other Ambulatory Visit: Payer: Self-pay | Admitting: Family Medicine

## 2022-06-03 DIAGNOSIS — Z1231 Encounter for screening mammogram for malignant neoplasm of breast: Secondary | ICD-10-CM

## 2022-06-04 ENCOUNTER — Other Ambulatory Visit: Payer: Medicare HMO

## 2022-06-10 ENCOUNTER — Ambulatory Visit (INDEPENDENT_AMBULATORY_CARE_PROVIDER_SITE_OTHER): Payer: Medicare HMO | Admitting: *Deleted

## 2022-06-10 DIAGNOSIS — M2142 Flat foot [pes planus] (acquired), left foot: Secondary | ICD-10-CM | POA: Diagnosis not present

## 2022-06-10 DIAGNOSIS — E0843 Diabetes mellitus due to underlying condition with diabetic autonomic (poly)neuropathy: Secondary | ICD-10-CM

## 2022-06-10 DIAGNOSIS — M2141 Flat foot [pes planus] (acquired), right foot: Secondary | ICD-10-CM | POA: Diagnosis not present

## 2022-06-10 DIAGNOSIS — E1141 Type 2 diabetes mellitus with diabetic mononeuropathy: Secondary | ICD-10-CM | POA: Diagnosis not present

## 2022-06-10 NOTE — Patient Instructions (Signed)
Change inserts every 4 months. 1st pair was put in today, 2nd pair change in April, 3rd pair in August   

## 2022-06-10 NOTE — Progress Notes (Signed)
Patient presents today to pick up diabetic shoe prescribed by Dr. Logan Bores.   Diabetic shoes and inserts were dispensed and fit was satisfactory. Reviewed instructions for break-in and wear. Written instructions given to patient.  Patient will follow up as needed.

## 2022-06-20 ENCOUNTER — Other Ambulatory Visit: Payer: Self-pay | Admitting: Family Medicine

## 2022-06-20 DIAGNOSIS — E1165 Type 2 diabetes mellitus with hyperglycemia: Secondary | ICD-10-CM

## 2022-06-22 ENCOUNTER — Encounter: Payer: Self-pay | Admitting: Family Medicine

## 2022-06-24 ENCOUNTER — Other Ambulatory Visit: Payer: Self-pay | Admitting: Family Medicine

## 2022-06-24 DIAGNOSIS — E1165 Type 2 diabetes mellitus with hyperglycemia: Secondary | ICD-10-CM

## 2022-06-24 MED ORDER — METFORMIN HCL 500 MG PO TABS: ORAL_TABLET | ORAL | 3 refills | Status: DC

## 2022-07-26 ENCOUNTER — Other Ambulatory Visit: Payer: Self-pay | Admitting: Family Medicine

## 2022-07-26 DIAGNOSIS — E1165 Type 2 diabetes mellitus with hyperglycemia: Secondary | ICD-10-CM

## 2022-07-26 DIAGNOSIS — I700 Atherosclerosis of aorta: Secondary | ICD-10-CM

## 2022-08-18 ENCOUNTER — Other Ambulatory Visit: Payer: Self-pay | Admitting: Cardiovascular Disease

## 2022-08-22 ENCOUNTER — Ambulatory Visit (INDEPENDENT_AMBULATORY_CARE_PROVIDER_SITE_OTHER): Payer: Medicare HMO | Admitting: Family Medicine

## 2022-08-22 ENCOUNTER — Encounter: Payer: Self-pay | Admitting: Family Medicine

## 2022-08-22 VITALS — BP 132/68 | HR 76 | Temp 97.8°F | Ht 63.0 in | Wt 144.4 lb

## 2022-08-22 DIAGNOSIS — K219 Gastro-esophageal reflux disease without esophagitis: Secondary | ICD-10-CM | POA: Diagnosis not present

## 2022-08-22 DIAGNOSIS — E1169 Type 2 diabetes mellitus with other specified complication: Secondary | ICD-10-CM

## 2022-08-22 DIAGNOSIS — R1319 Other dysphagia: Secondary | ICD-10-CM | POA: Diagnosis not present

## 2022-08-22 DIAGNOSIS — I700 Atherosclerosis of aorta: Secondary | ICD-10-CM

## 2022-08-22 DIAGNOSIS — E538 Deficiency of other specified B group vitamins: Secondary | ICD-10-CM | POA: Diagnosis not present

## 2022-08-22 DIAGNOSIS — E1159 Type 2 diabetes mellitus with other circulatory complications: Secondary | ICD-10-CM

## 2022-08-22 DIAGNOSIS — E1165 Type 2 diabetes mellitus with hyperglycemia: Secondary | ICD-10-CM | POA: Diagnosis not present

## 2022-08-22 DIAGNOSIS — Z0001 Encounter for general adult medical examination with abnormal findings: Secondary | ICD-10-CM | POA: Diagnosis not present

## 2022-08-22 DIAGNOSIS — E785 Hyperlipidemia, unspecified: Secondary | ICD-10-CM

## 2022-08-22 DIAGNOSIS — I471 Supraventricular tachycardia, unspecified: Secondary | ICD-10-CM

## 2022-08-22 DIAGNOSIS — Z0000 Encounter for general adult medical examination without abnormal findings: Secondary | ICD-10-CM

## 2022-08-22 DIAGNOSIS — E875 Hyperkalemia: Secondary | ICD-10-CM

## 2022-08-22 DIAGNOSIS — I152 Hypertension secondary to endocrine disorders: Secondary | ICD-10-CM

## 2022-08-22 LAB — BAYER DCA HB A1C WAIVED: HB A1C (BAYER DCA - WAIVED): 5.7 % — ABNORMAL HIGH (ref 4.8–5.6)

## 2022-08-22 NOTE — Patient Instructions (Signed)

## 2022-08-22 NOTE — Progress Notes (Signed)
Complete physical exam  Patient: Cassie Hunter   DOB: 06-23-1956   67 y.o. Female  MRN: [id number]  Subjective:    Chief Complaint  Patient presents with   Annual Exam    Cassie Hunter is a 67 y.o. female who presents today for a complete physical exam. She reports consuming a  regular  diet. She is having a lot of dysphagia due to a severe stricture so she is not able to eat much at time. She generally feels fairly well. She reports sleeping fairly well. She does not have additional problems to discuss today.   She reports that fasting blood sugars have been <130. She is on an ARB and statin. Established with cardiology.   She has been referred to a GI specialist in [facility] for an EGD due to a severe esophageal stricture. This will be on 09/04/22. She is on omeprazole 40 mg daily for GERD.   She has a history of SVT. She reports runs of this at times that last for up to 2 hours. She is established with cardiology and is on cardizem for this. She has a follow up appt with cardiology later this months.    Most recent fall risk assessment:    08/22/2022    7:57 AM  Fall Risk   Falls in the past year? 1  Number falls in past yr: 0  Injury with Fall? 1  Risk for fall due to : History of fall(s)  Follow up Falls evaluation completed     Most recent depression screenings:    08/22/2022    7:58 AM 03/20/2022   11:25 AM  PHQ 2/9 Scores  PHQ - 2 Score 0 0  PHQ- 9 Score 0       Past Medical History:  Diagnosis Date   Diabetes mellitus    Difficulty swallowing    Diverticulitis    Essential hypertension    GERD (gastroesophageal reflux disease)    History of esophageal stricture       Patient Care Team: Gwenlyn Perking, FNP as PCP - General (Family Medicine) Burnell Blanks, MD as PCP - Cardiology (Cardiology) Celestia Khat, OD [facility])   Outpatient Medications Prior to Visit  Medication Sig   albuterol (VENTOLIN HFA) 108 (90 Base)  MCG/ACT inhaler Inhale 2 puffs into the lungs every 6 (six) hours as needed.   Alcohol Swabs (B-D SINGLE USE SWABS REGULAR) PADS Test BS daily and as needed Dx E11.9   aspirin 81 MG EC tablet Take 1 tablet (81 mg total) by mouth daily. Swallow whole.   Blood Glucose Calibration (TRUE METRIX LEVEL 1) Low SOLN Use with glucose monitor Dx E11.9   diltiazem (CARDIZEM CD) 120 MG 24 hr capsule Take 1 capsule by mouth once daily   diltiazem (CARDIZEM) 30 MG tablet Take 1 tablet (30 mg total) by mouth 4 (four) times daily as needed.   glucose blood (TRUE METRIX BLOOD GLUCOSE TEST) test strip Test BS daily and as needed Dx E11.9   losartan (COZAAR) 100 MG tablet TAKE 1 TABLET EVERY DAY   metFORMIN (GLUCOPHAGE) 500 MG tablet Take 2 tablets (1,000 mg total) by mouth daily with breakfast AND 1 tablet (500 mg total) daily with supper.   omeprazole (PRILOSEC) 40 MG capsule TAKE 1 CAPSULE BY MOUTH TWICE DAILY BEFORE A MEAL   ondansetron (ZOFRAN-ODT) 4 MG disintegrating tablet Take 1 tablet (4 mg total) by mouth every 8 (eight) hours as needed for nausea or vomiting.  simvastatin (ZOCOR) 20 MG tablet TAKE 1 TABLET EVERY DAY AT 6 PM   TRUEplus Lancets 33G MISC Test BS daily and as needed Dx E11.9   [DISCONTINUED] Ascorbic Acid (VITAMIN C PO) Take by mouth daily.   [DISCONTINUED] ferrous sulfate 325 (65 FE) MG tablet Take 325 mg by mouth daily.   [DISCONTINUED] hydrocortisone 1 % lotion Apply 1 Application topically 2 (two) times daily.   [DISCONTINUED] naproxen (NAPROSYN) 500 MG tablet Take 1 tablet (500 mg total) by mouth 2 (two) times daily as needed for moderate pain.   [DISCONTINUED] nystatin (MYCOSTATIN/NYSTOP) powder Apply 1 application  topically 3 (three) times daily.   [DISCONTINUED] predniSONE (DELTASONE) 10 MG tablet Take 1 tablet (10 mg total) by mouth daily with breakfast.   No facility-administered medications prior to visit.    ROS Negative unless specially indicated above in HPI.      Objective:     BP 132/68   Pulse 76   Temp 97.8 F (36.6 C) (Temporal)   Ht 5' 3"$  (1.6 m)   Wt 144 lb 6 oz (65.5 kg)   SpO2 100%   BMI 25.57 kg/m    Physical Exam Vitals and nursing note reviewed.  Constitutional:      General: She is not in acute distress.    Appearance: Normal appearance. She is not ill-appearing.  HENT:     Head: Normocephalic.     Right Ear: Tympanic membrane, ear canal and external ear normal.     Left Ear: Tympanic membrane, ear canal and external ear normal.     Nose: Nose normal.     Mouth/Throat:     Mouth: Mucous membranes are dry.     Pharynx: Oropharynx is clear.  Eyes:     Extraocular Movements: Extraocular movements intact.     Conjunctiva/sclera: Conjunctivae normal.     Pupils: Pupils are equal, round, and reactive to light.  Neck:     Vascular: No carotid bruit.  Cardiovascular:     Rate and Rhythm: Normal rate and regular rhythm.     Pulses: Normal pulses.     Heart sounds: Normal heart sounds. No murmur heard.    No friction rub. No gallop.  Pulmonary:     Effort: Pulmonary effort is normal.     Breath sounds: Normal breath sounds.  Abdominal:     General: Bowel sounds are normal. There is no distension.     Palpations: Abdomen is soft. There is no mass.     Tenderness: There is no abdominal tenderness. There is no guarding.  Musculoskeletal:        General: No swelling or tenderness.     Cervical back: Normal range of motion and neck supple. No tenderness.     Right lower leg: No edema.     Left lower leg: No edema.  Skin:    General: Skin is warm and dry.     Capillary Refill: Capillary refill takes less than 2 seconds.     Findings: No lesion or rash.  Neurological:     General: No focal deficit present.     Mental Status: She is alert and oriented to person, place, and time.     Cranial Nerves: No cranial nerve deficit.     Motor: No weakness.     Gait: Gait normal.  Psychiatric:        Mood and Affect: Mood  normal.        Behavior: Behavior normal.  Thought Content: Thought content normal.        Judgment: Judgment normal.      No results found for any visits on 08/22/22.     Assessment & Plan:    Routine Health Maintenance and Physical Exam  Ashya was seen today for annual exam.  Diagnoses and all orders for this visit:  Routine general medical examination at a health care facility -     CMP14+EGFR  Type 2 diabetes mellitus with hyperglycemia, without long-term current use of insulin (HCC) A1c is 5.7 today. At goal of <7. On statin and ARB. Sees podiatry. Urine micro today. Reminded to schedule eye exam.  -     Microalbumin / creatinine urine ratio -     Bayer DCA Hb A1c Waived -     CMP14+EGFR  Hypertension associated with diabetes ([place]) Well controlled on current regimen. On losartan. -     Bayer DCA Hb A1c Waived  Dyslipidemia associated with type 2 diabetes mellitus (HCC) Last LDL was 90. Continue statin. Diet and exercise.  -     Bayer DCA Hb A1c Waived  Aortic atherosclerosis ([place]) On statin and aspirin.   Hyperkalemia CMP pending.   Gastroesophageal reflux disease, unspecified whether esophagitis present Esophageal dysphagia Managed by GI. On prilosec. Has upcoming EGD with [facility].  Vitamin B12 deficiency On supplement. Last b12 WNL.  Supraventricular tachycardia Managed by cardiology. On cardizem. Has upcoming appt.    Immunization History  Administered Date(s) Administered   Fluad Quad(high Dose 65+) 06/15/2020, 05/10/2021, 04/09/2022   Influenza, Seasonal, Injecte, Preservative Fre 06/01/2014   Influenza,inj,Quad PF,6+ Mos 06/01/2014, 05/02/2017, 04/30/2018, 06/22/2019   Influenza-Unspecified 06/01/2014, 05/02/2017, 04/30/2018   PFIZER(Purple Top)SARS-COV-2 Vaccination 03/22/2020, 04/12/2020   Pneumococcal Conjugate-13 10/12/2020   Pneumococcal Polysaccharide-23 05/27/2017   Tdap 07/16/2015    Health Maintenance  Topic Date Due    OPHTHALMOLOGY EXAM  02/21/2022   Diabetic kidney evaluation - Urine ACR  05/15/2022   HEMOGLOBIN A1C  08/22/2022   COVID-19 Vaccine (3 - 2023-24 season) 09/08/2023 (Originally 03/15/2022)   Zoster Vaccines- Shingrix (1 of 2) 11/20/2023 (Originally 01/08/2005)   MAMMOGRAM  09/13/2022   Medicare Annual Wellness (AWV)  12/14/2022   FOOT EXAM  02/20/2023   Diabetic kidney evaluation - eGFR measurement  03/05/2023   Fecal DNA (Cologuard)  05/01/2023   DEXA SCAN  05/08/2025   DTaP/Tdap/Td (2 - Td or Tdap) 07/15/2025   Pneumonia Vaccine 72+ Years old (3 of 3 - PPSV23 or PCV20) 10/12/2025   INFLUENZA VACCINE  Completed   Hepatitis C Screening  Completed   HPV VACCINES  Aged Out   COLONOSCOPY (Pts 45-29yr Insurance coverage will need to be confirmed)  Discontinued    Discussed health benefits of physical activity, and encouraged her to engage in regular exercise appropriate for her age and condition.  Problem List Items Addressed This Visit       Cardiovascular and Mediastinum   Aortic atherosclerosis ([place]   Supraventricular tachycardia   Hypertension associated with diabetes ([place]     Digestive   GERD (gastroesophageal reflux disease)   RESOLVED: Difficulty swallowing     Endocrine   Dyslipidemia associated with type 2 diabetes mellitus ([place]     Other   Vitamin B12 deficiency   Hyperkalemia   Other Visit Diagnoses     Routine general medical examination at a health care facility    -  Primary      Return in about 6 months (around 02/20/2023) for chronic follow  up.     Gwenlyn Perking, FNP

## 2022-08-23 LAB — CMP14+EGFR
ALT: 7 IU/L (ref 0–32)
AST: 13 IU/L (ref 0–40)
Albumin/Globulin Ratio: 2.1 (ref 1.2–2.2)
Albumin: 4.1 g/dL (ref 3.9–4.9)
Alkaline Phosphatase: 97 IU/L (ref 44–121)
BUN/Creatinine Ratio: 9 — ABNORMAL LOW (ref 12–28)
BUN: 7 mg/dL — ABNORMAL LOW (ref 8–27)
Bilirubin Total: 0.4 mg/dL (ref 0.0–1.2)
CO2: 23 mmol/L (ref 20–29)
Calcium: 9 mg/dL (ref 8.7–10.3)
Chloride: 100 mmol/L (ref 96–106)
Creatinine, Ser: 0.79 mg/dL (ref 0.57–1.00)
Globulin, Total: 2 g/dL (ref 1.5–4.5)
Glucose: 103 mg/dL — ABNORMAL HIGH (ref 70–99)
Potassium: 4.9 mmol/L (ref 3.5–5.2)
Sodium: 138 mmol/L (ref 134–144)
Total Protein: 6.1 g/dL (ref 6.0–8.5)
eGFR: 82 mL/min/{1.73_m2} (ref >59)

## 2022-08-29 ENCOUNTER — Encounter: Payer: Self-pay | Admitting: Cardiovascular Disease

## 2022-09-07 NOTE — Progress Notes (Unsigned)
No chief complaint on file.  History of Present Illness: 67 yo female with history of SVT, DM, hyperlipidemia, esophageal stricture and HTN who is here today for cardiac follow up. She was seen as a new patient for the evaluation of palpitations in August 2022. She has a history of esophageal stricture. She had a choking episode on May 2022. After this, she felt her heart racing and chest pain. EMS told her that her heart was racing and that she had SVT. She tried vagal maneuvers and then had IV adenosine and IV cardizem administered per EMS and converted to sinus. Echo September 2022 with LVEF=60-65% and no valve disease. She called our office in January 2023 and reported palpitations. Cardiac monitor February 2023 with 6 runs of SVT, longest 8 beats. Cardizem CD 120 mg was started daily.   She is here today for follow up. The patient denies any chest pain, dyspnea, palpitations, lower extremity edema, orthopnea, PND, dizziness, near syncope or syncope.   Primary Care Physician: Gwenlyn Perking, FNP   Past Medical History:  Diagnosis Date   Diabetes mellitus    Difficulty swallowing    Diverticulitis    Essential hypertension    GERD (gastroesophageal reflux disease)    History of esophageal stricture     Past Surgical History:  Procedure Laterality Date   ABDOMINAL HYSTERECTOMY     APPENDECTOMY     CESAREAN SECTION  02/26/1988-07/01/1990   CHOLECYSTECTOMY     SPINAL CORD DECOMPRESSION  1996   TUBAL LIGATION  07/01/1990    Current Outpatient Medications  Medication Sig Dispense Refill   albuterol (VENTOLIN HFA) 108 (90 Base) MCG/ACT inhaler Inhale 2 puffs into the lungs every 6 (six) hours as needed. 18 g 2   Alcohol Swabs (B-D SINGLE USE SWABS REGULAR) PADS Test BS daily and as needed Dx E11.9 100 each 3   aspirin 81 MG EC tablet Take 1 tablet (81 mg total) by mouth daily. Swallow whole. 30 tablet 12   Blood Glucose Calibration (TRUE METRIX LEVEL 1) Low SOLN Use with glucose  monitor Dx E11.9 3 each 3   diltiazem (CARDIZEM CD) 120 MG 24 hr capsule Take 1 capsule by mouth once daily 90 capsule 0   diltiazem (CARDIZEM) 30 MG tablet Take 1 tablet (30 mg total) by mouth 4 (four) times daily as needed. 60 tablet 1   glucose blood (TRUE METRIX BLOOD GLUCOSE TEST) test strip Test BS daily and as needed Dx E11.9 100 each 3   losartan (COZAAR) 100 MG tablet TAKE 1 TABLET EVERY DAY 90 tablet 1   metFORMIN (GLUCOPHAGE) 500 MG tablet Take 2 tablets (1,000 mg total) by mouth daily with breakfast AND 1 tablet (500 mg total) daily with supper. 270 tablet 3   omeprazole (PRILOSEC) 40 MG capsule TAKE 1 CAPSULE BY MOUTH TWICE DAILY BEFORE A MEAL 180 capsule 0   ondansetron (ZOFRAN-ODT) 4 MG disintegrating tablet Take 1 tablet (4 mg total) by mouth every 8 (eight) hours as needed for nausea or vomiting. 30 tablet 0   simvastatin (ZOCOR) 20 MG tablet TAKE 1 TABLET EVERY DAY AT 6 PM 90 tablet 0   TRUEplus Lancets 33G MISC Test BS daily and as needed Dx E11.9 100 each 3   No current facility-administered medications for this visit.    Allergies  Allergen Reactions   Codeine Nausea Only and Other (See Comments)   Neosporin [Bacitracin-Polymyxin B] Rash    Social History   Socioeconomic History  Marital status: Married    Spouse name: Fritz Pickerel   Number of children: 2   Years of education: Not on file   Highest education level: Not on file  Occupational History   Occupation: Retired Consulting civil engineer  Tobacco Use   Smoking status: Never   Smokeless tobacco: Never  Vaping Use   Vaping Use: Never used  Substance and Sexual Activity   Alcohol use: No   Drug use: No   Sexual activity: Not Currently    Birth control/protection: Surgical  Other Topics Concern   Not on file  Social History Narrative   Lives with her husband in one level home   Daughter lives 2 blocks away   Social Determinants of Health   Financial Resource Strain: [place]  (12/13/2021)   Overall Financial  Resource Strain (CARDIA)    Difficulty of Paying Living Expenses: Not hard at all  Food Insecurity: No [place] (12/13/2021)   Hunger Vital Sign    Worried About Running Out of Food in the Last Year: Never true    [place] in the Last Year: Never true  Transportation Needs: No Transportation Needs (12/13/2021)   PRAPARE - Hydrologist (Medical): No    Lack of Transportation (Non-Medical): No  Physical Activity: Insufficiently Active (12/13/2021)   Exercise Vital Sign    Days of Exercise per Week: 3 days    Minutes of Exercise per Session: 40 min  Stress: No Stress Concern Present (12/13/2021)   [place]    Feeling of Stress : Not at all  Social Connections: Moderately Integrated (12/13/2021)   Social Connection and Isolation Panel [NHANES]    Frequency of Communication with Friends and Family: More than three times a week    Frequency of Social Gatherings with Friends and Family: Once a week    Attends Religious Services: Never    Marine scientist or Organizations: Yes    Attends Music therapist: More than 4 times per year    Marital Status: Married  Human resources officer Violence: Not At Risk (12/13/2021)   Humiliation, Afraid, Rape, and Kick questionnaire    Fear of Current or Ex-Partner: No    Emotionally Abused: No    Physically Abused: No    Sexually Abused: No    Family History  Problem Relation Age of Onset   Pancreatic cancer Father    Lung cancer Father    Cancer Father    CVA Mother    Stroke Mother    Spina bifida Sister    Heart attack Maternal Grandmother    Heart attack Maternal Grandfather    Stroke Paternal Grandmother    Aneurysm Paternal Grandfather    ADD / ADHD Son    Asthma Son     Review of Systems:  As stated in the HPI and otherwise negative.   There were no vitals taken for this visit.  Physical Examination: General: Well  developed, well nourished, NAD  HEENT: OP clear, mucus membranes moist  SKIN: warm, dry. No rashes. Neuro: No focal deficits  Musculoskeletal: Muscle strength 5/5 all ext  Psychiatric: Mood and affect normal  Neck: No JVD, no carotid bruits, no thyromegaly, no lymphadenopathy.  Lungs:Clear bilaterally, no wheezes, rhonci, crackles Cardiovascular: Regular rate and rhythm. No murmurs, gallops or rubs. Abdomen:Soft. Bowel sounds present. Non-tender.  Extremities: No lower extremity edema. Pulses are 2 + in the bilateral DP/PT.  EKG:  EKG is *** ordered today. The EKG is personally reviewed and shows   Recent Labs: 02/19/2022: Hemoglobin 11.8; Platelets 354; TSH 1.290 08/22/2022: ALT 7; BUN 7; Creatinine, Ser 0.79; Potassium 4.9; Sodium 138   Lipid Panel    Component Value Date/Time   CHOL 199 02/19/2022 0857   TRIG 110 02/19/2022 0857   HDL 90 02/19/2022 0857   CHOLHDL 2.2 02/19/2022 0857   LDLCALC 90 02/19/2022 0857     Wt Readings from Last 3 Encounters:  08/22/22 65.5 kg  03/20/22 66.2 kg  03/04/22 67.4 kg    Assessment and Plan:   1. SVT: Infrequent episodes of SVT. Continue Cardizem. If the frequency of her episodes increases, will refer to EP to discuss ablation.   Labs/ tests ordered today include:  No orders of the defined types were placed in this encounter.  Disposition:   F/U me in 12 months.   Signed, Lauree Chandler, MD 09/07/2022 8:34 AM    [facility] [address], [place], [place]  [address] Phone: [phone]; Fax: [phone]

## 2022-09-09 ENCOUNTER — Ambulatory Visit: Payer: Medicare HMO | Attending: Cardiovascular Disease | Admitting: Cardiovascular Disease

## 2022-09-09 ENCOUNTER — Encounter: Payer: Self-pay | Admitting: Cardiovascular Disease

## 2022-09-09 VITALS — BP 158/70 | HR 82 | Ht 63.0 in | Wt 146.0 lb

## 2022-09-09 DIAGNOSIS — I471 Supraventricular tachycardia, unspecified: Secondary | ICD-10-CM | POA: Diagnosis not present

## 2022-09-09 DIAGNOSIS — I480 Paroxysmal atrial fibrillation: Secondary | ICD-10-CM

## 2022-09-09 MED ORDER — DILTIAZEM HCL ER COATED BEADS 240 MG PO CP24: 240.0000 mg | ORAL_CAPSULE | Freq: Every day | ORAL | 3 refills | Status: DC

## 2022-09-09 NOTE — Patient Instructions (Signed)
Medication Instructions:  Your physician has recommended you make the following change in your medication:  1.) increase diltiazem (Cardizem) to 240 mg daily  *If you need a refill on your cardiac medications before your next appointment, please call your pharmacy*   Lab Work: none   Testing/Procedures: none   Follow-Up: At [facility], you and your health needs are our priority.  As part of our continuing mission to provide you with exceptional heart care, we have created designated Provider Care Teams.  These Care Teams include your primary Cardiologist (physician) and Advanced Practice Providers (APPs -  Physician Assistants and Nurse Practitioners) who all work together to provide you with the care you need, when you need it.   Your next appointment:   12 month(s)  Provider:   Lauree Chandler, MD

## 2022-09-11 ENCOUNTER — Other Ambulatory Visit: Payer: Self-pay | Admitting: Family Medicine

## 2022-09-11 DIAGNOSIS — R1319 Other dysphagia: Secondary | ICD-10-CM

## 2022-09-11 DIAGNOSIS — B353 Tinea pedis: Secondary | ICD-10-CM

## 2022-09-16 ENCOUNTER — Ambulatory Visit: Payer: Medicare HMO

## 2022-10-23 ENCOUNTER — Encounter: Payer: Self-pay | Admitting: Family Medicine

## 2022-10-23 DIAGNOSIS — E1165 Type 2 diabetes mellitus with hyperglycemia: Secondary | ICD-10-CM

## 2022-10-24 ENCOUNTER — Other Ambulatory Visit: Payer: Self-pay | Admitting: Family Medicine

## 2022-10-24 DIAGNOSIS — E1165 Type 2 diabetes mellitus with hyperglycemia: Secondary | ICD-10-CM

## 2022-10-24 DIAGNOSIS — I700 Atherosclerosis of aorta: Secondary | ICD-10-CM

## 2022-10-24 MED ORDER — METFORMIN HCL 500 MG PO TABS: ORAL_TABLET | ORAL | 0 refills | Status: DC

## 2022-10-24 NOTE — Addendum Note (Signed)
Addended by: Tamera Punt on: 10/24/2022 10:04 AM   Modules accepted: Orders

## 2022-11-13 ENCOUNTER — Ambulatory Visit: Payer: Medicare HMO

## 2022-11-23 ENCOUNTER — Other Ambulatory Visit: Payer: Self-pay | Admitting: Family Medicine

## 2022-11-23 DIAGNOSIS — E1165 Type 2 diabetes mellitus with hyperglycemia: Secondary | ICD-10-CM

## 2022-11-23 DIAGNOSIS — I10 Essential (primary) hypertension: Secondary | ICD-10-CM

## 2022-12-03 ENCOUNTER — Emergency Department (HOSPITAL_COMMUNITY): Payer: Medicare HMO

## 2022-12-03 ENCOUNTER — Other Ambulatory Visit: Payer: Self-pay

## 2022-12-03 ENCOUNTER — Emergency Department (HOSPITAL_COMMUNITY)
Admission: EM | Admit: 2022-12-03 | Discharge: 2022-12-03 | Disposition: A | Discharge status: Home / Self Care | Payer: Medicare HMO | Attending: Emergency Medicine | Admitting: Emergency Medicine

## 2022-12-03 DIAGNOSIS — Z79899 Other long term (current) drug therapy: Secondary | ICD-10-CM | POA: Insufficient documentation

## 2022-12-03 DIAGNOSIS — R42 Dizziness and giddiness: Secondary | ICD-10-CM | POA: Diagnosis not present

## 2022-12-03 DIAGNOSIS — R112 Nausea with vomiting, unspecified: Secondary | ICD-10-CM | POA: Insufficient documentation

## 2022-12-03 DIAGNOSIS — Z7982 Long term (current) use of aspirin: Secondary | ICD-10-CM | POA: Diagnosis not present

## 2022-12-03 DIAGNOSIS — G4489 Other headache syndrome: Secondary | ICD-10-CM | POA: Diagnosis not present

## 2022-12-03 DIAGNOSIS — R110 Nausea: Secondary | ICD-10-CM | POA: Diagnosis not present

## 2022-12-03 DIAGNOSIS — R519 Headache, unspecified: Secondary | ICD-10-CM | POA: Insufficient documentation

## 2022-12-03 DIAGNOSIS — I6523 Occlusion and stenosis of bilateral carotid arteries: Secondary | ICD-10-CM | POA: Diagnosis not present

## 2022-12-03 DIAGNOSIS — Z7984 Long term (current) use of oral hypoglycemic drugs: Secondary | ICD-10-CM | POA: Insufficient documentation

## 2022-12-03 DIAGNOSIS — R0689 Other abnormalities of breathing: Secondary | ICD-10-CM | POA: Diagnosis not present

## 2022-12-03 DIAGNOSIS — R531 Weakness: Secondary | ICD-10-CM | POA: Diagnosis not present

## 2022-12-03 DIAGNOSIS — Z20822 Contact with and (suspected) exposure to covid-19: Secondary | ICD-10-CM | POA: Diagnosis not present

## 2022-12-03 DIAGNOSIS — I10 Essential (primary) hypertension: Secondary | ICD-10-CM | POA: Diagnosis not present

## 2022-12-03 LAB — CBC WITH DIFFERENTIAL/PLATELET
Abs Immature Granulocytes: 0.03 10*3/uL (ref 0.00–0.07)
Basophils Absolute: 0 10*3/uL (ref 0.0–0.1)
Basophils Relative: 1 %
Eosinophils Absolute: 0.1 10*3/uL (ref 0.0–0.5)
Eosinophils Relative: 1 %
HCT: 35.1 % — ABNORMAL LOW (ref 36.0–46.0)
Hemoglobin: 11.8 g/dL — ABNORMAL LOW (ref 12.0–15.0)
Immature Granulocytes: 0 %
Lymphocytes Relative: 16 %
Lymphs Abs: 1.3 10*3/uL (ref 0.7–4.0)
MCH: 28.6 pg (ref 26.0–34.0)
MCHC: 33.6 g/dL (ref 30.0–36.0)
MCV: 85.2 fL (ref 80.0–100.0)
Monocytes Absolute: 0.5 10*3/uL (ref 0.1–1.0)
Monocytes Relative: 7 %
Neutro Abs: 6 10*3/uL (ref 1.7–7.7)
Neutrophils Relative %: 75 %
Platelets: 355 10*3/uL (ref 150–400)
RBC: 4.12 MIL/uL (ref 3.87–5.11)
RDW: 12.8 % (ref 11.5–15.5)
WBC: 8 10*3/uL (ref 4.0–10.5)
nRBC: 0 % (ref 0.0–0.2)

## 2022-12-03 LAB — URINALYSIS, ROUTINE W REFLEX MICROSCOPIC
Bilirubin Urine: NEGATIVE
Glucose, UA: NEGATIVE mg/dL
Hgb urine dipstick: NEGATIVE
Ketones, ur: NEGATIVE mg/dL
Leukocytes,Ua: NEGATIVE
Nitrite: NEGATIVE
Protein, ur: NEGATIVE mg/dL
Specific Gravity, Urine: 1.011 (ref 1.005–1.030)
pH: 7 (ref 5.0–8.0)

## 2022-12-03 LAB — SARS CORONAVIRUS 2 BY RT PCR: SARS Coronavirus 2 by RT PCR: NEGATIVE

## 2022-12-03 LAB — BASIC METABOLIC PANEL
Anion gap: 9 (ref 5–15)
BUN: 8 mg/dL (ref 8–23)
CO2: 26 mmol/L (ref 22–32)
Calcium: 8.8 mg/dL — ABNORMAL LOW (ref 8.9–10.3)
Chloride: 95 mmol/L — ABNORMAL LOW (ref 98–111)
Creatinine, Ser: 0.79 mg/dL (ref 0.44–1.00)
GFR, Estimated: >60 mL/min (ref >60)
Glucose, Bld: 136 mg/dL — ABNORMAL HIGH (ref 70–99)
Potassium: 3.7 mmol/L (ref 3.5–5.1)
Sodium: 130 mmol/L — ABNORMAL LOW (ref 135–145)

## 2022-12-03 LAB — LACTIC ACID, PLASMA: Lactic Acid, Venous: 1.9 mmol/L (ref 0.5–1.9)

## 2022-12-03 MED ORDER — ACETAMINOPHEN 325 MG PO TABS
650.0000 mg | ORAL_TABLET | Freq: Once | ORAL | Status: AC
  Administered 2022-12-03: 650 mg via ORAL
  Filled 2022-12-03: qty 2

## 2022-12-03 MED ORDER — IOHEXOL 350 MG/ML SOLN
75.0000 mL | Freq: Once | INTRAVENOUS | Status: AC | PRN
  Administered 2022-12-03: 75 mL via INTRAVENOUS

## 2022-12-03 MED ORDER — ONDANSETRON 4 MG PO TBDP: 4.0000 mg | ORAL_TABLET | Freq: Three times a day (TID) | ORAL | 0 refills | Status: DC | PRN

## 2022-12-03 MED ORDER — IBUPROFEN 400 MG PO TABS
400.0000 mg | ORAL_TABLET | Freq: Once | ORAL | Status: AC
  Administered 2022-12-03: 400 mg via ORAL
  Filled 2022-12-03: qty 1

## 2022-12-03 MED ORDER — MECLIZINE HCL 25 MG PO TABS: 25.0000 mg | ORAL_TABLET | Freq: Three times a day (TID) | ORAL | 0 refills | Status: DC | PRN

## 2022-12-03 MED ORDER — SODIUM CHLORIDE 0.9 % IV BOLUS
500.0000 mL | Freq: Once | INTRAVENOUS | Status: AC
  Administered 2022-12-03: 500 mL via INTRAVENOUS

## 2022-12-03 MED ORDER — MECLIZINE HCL 12.5 MG PO TABS
25.0000 mg | ORAL_TABLET | Freq: Once | ORAL | Status: AC
  Administered 2022-12-03: 25 mg via ORAL
  Filled 2022-12-03: qty 2

## 2022-12-03 NOTE — ED Provider Notes (Signed)
  Physical Exam  BP (!) 150/67   Pulse 79   Temp 97.6 F (36.4 C) (Oral)   Resp 13   Ht 5\' 3"  (1.6 m)   Wt 66 kg   SpO2 100%   BMI 25.77 kg/m   Physical Exam Vitals and nursing note reviewed.  Constitutional:      General: She is not in acute distress.    Appearance: She is well-developed.  HENT:     Head: Normocephalic and atraumatic.  Eyes:     Conjunctiva/sclera: Conjunctivae normal.  Cardiovascular:     Rate and Rhythm: Normal rate and regular rhythm.     Heart sounds: No murmur heard. Pulmonary:     Effort: Pulmonary effort is normal. No respiratory distress.     Breath sounds: Normal breath sounds.  Abdominal:     Palpations: Abdomen is soft.     Tenderness: There is no abdominal tenderness.  Musculoskeletal:        General: No swelling.     Cervical back: Neck supple.  Skin:    General: Skin is warm and dry.     Capillary Refill: Capillary refill takes less than 2 seconds.  Neurological:     Mental Status: She is alert.  Psychiatric:        Mood and Affect: Mood normal.     Procedures  Procedures  ED Course / MDM   Clinical Course as of 12/03/22 0751  Tue Dec 03, 2022  0700 Pending re-eval [MK]    Clinical Course User Index [MK] Guerin Lashomb, Wyn Forster, MD   Medical Decision Making Amount and/or Complexity of Data Reviewed Labs: ordered. Radiology: ordered.  Risk OTC drugs. Prescription drug management.   Patient received in handoff.  Dizziness worse with positional movements and fatigable.  Concern for peripheral vertigo.  Plan at time of signout was for reevaluation after meclizine administration.  On reevaluation, patient states symptoms have significant improved.  Patient will be discharged with meclizine and Zofran.       Glendora Score, MD 12/03/22 [phone]

## 2022-12-03 NOTE — ED Triage Notes (Signed)
Pt BIB [facility] from home with c/o headache, nausea and dizziness.

## 2022-12-03 NOTE — ED Provider Notes (Signed)
[place] [facility]ERGENCY DEPARTMENT AT [facility] Provider Note   CSN: [id number] Arrival date & time: 12/03/22  4098     History {Add pertinent medical, surgical, social history, OB history to HPI:1} No chief complaint on file.   Cassie Hunter is a 67 y.o. female.  Patient presents to the emergency department for evaluation of dizziness and headache.  Patient reports that she feels weak all over.  Symptoms began this morning when she woke up.  She is experiencing nausea, has not had any vomiting.  No abdominal pain.  No diarrhea or constipation.       Home Medications Prior to Admission medications   Medication Sig Start Date End Date Taking? Authorizing Provider  albuterol (VENTOLIN HFA) 108 (90 Base) MCG/ACT inhaler Inhale 2 puffs into the lungs every 6 (six) hours as needed. 10/12/20   Gwenlyn Fudge, FNP  Alcohol Swabs (B-D SINGLE USE SWABS REGULAR) PADS Test BS daily and as needed Dx E11.9 05/15/20   Dettinger, Elige Radon, MD  aspirin 81 MG EC tablet Take 1 tablet (81 mg total) by mouth daily. Swallow whole. 05/15/21   Gwenlyn Fudge, FNP  Blood Glucose Calibration (TRUE METRIX LEVEL 1) Low SOLN Use with glucose monitor Dx E11.9 05/11/20   Dettinger, Elige Radon, MD  diltiazem (CARDIZEM CD) 240 MG 24 hr capsule Take 1 capsule (240 mg total) by mouth daily. 09/09/22   Kathleene Hazel, MD  diltiazem (CARDIZEM) 30 MG tablet Take 1 tablet (30 mg total) by mouth 4 (four) times daily as needed. 08/06/21   Kathleene Hazel, MD  glucose blood (TRUE METRIX BLOOD GLUCOSE TEST) test strip Test BS daily and as needed Dx E11.9 05/15/20   Dettinger, Elige Radon, MD  losartan (COZAAR) 100 MG tablet TAKE 1 TABLET EVERY DAY 11/25/22   Gabriel Earing, FNP  metFORMIN (GLUCOPHAGE) 500 MG tablet Take 2 tablets (1,000 mg total) by mouth daily with breakfast AND 1 tablet (500 mg total) daily with supper. 10/24/22   Gabriel Earing, FNP  omeprazole (PRILOSEC) 40 MG capsule TAKE  1 CAPSULE BY MOUTH TWICE DAILY BEFORE A MEAL 09/11/22   Gabriel Earing, FNP  ondansetron (ZOFRAN-ODT) 4 MG disintegrating tablet Take 1 tablet (4 mg total) by mouth every 8 (eight) hours as needed for nausea or vomiting. 12/13/21   Gwenlyn Fudge, FNP  simvastatin (ZOCOR) 20 MG tablet TAKE 1 TABLET EVERY DAY AT 6 PM 10/24/22   Gabriel Earing, FNP  TRUEplus Lancets 33G MISC Test BS daily and as needed Dx E11.9 05/15/20   Dettinger, Elige Radon, MD      Allergies    Codeine and Neosporin [bacitracin-polymyxin b]    Review of Systems   Review of Systems  Physical Exam Updated Vital Signs BP (!) 150/67   Pulse 79   Temp (!) 96.8 F (36 C) (Rectal)   Resp 13   Ht 5\' 3"  (1.6 m)   Wt 66 kg   SpO2 100%   BMI 25.77 kg/m  Physical Exam Vitals and nursing note reviewed.  Constitutional:      General: She is not in acute distress.    Appearance: She is well-developed.  HENT:     Head: Normocephalic and atraumatic.     Mouth/Throat:     Mouth: Mucous membranes are moist.  Eyes:     General: Vision grossly intact. Gaze aligned appropriately.     Extraocular Movements: Extraocular movements intact.     Conjunctiva/sclera:  Conjunctivae normal.  Cardiovascular:     Rate and Rhythm: Normal rate and regular rhythm.     Pulses: Normal pulses.     Heart sounds: Normal heart sounds, S1 normal and S2 normal. No murmur heard.    No friction rub. No gallop.  Pulmonary:     Effort: Pulmonary effort is normal. No respiratory distress.     Breath sounds: Normal breath sounds.  Abdominal:     General: Bowel sounds are normal.     Palpations: Abdomen is soft.     Tenderness: There is no abdominal tenderness. There is no guarding or rebound.     Hernia: No hernia is present.  Musculoskeletal:        General: No swelling.     Cervical back: Full passive range of motion without pain, normal range of motion and neck supple. No spinous process tenderness or muscular tenderness. Normal range of  motion.     Right lower leg: No edema.     Left lower leg: No edema.  Skin:    General: Skin is warm and dry.     Capillary Refill: Capillary refill takes less than 2 seconds.     Findings: No ecchymosis, erythema, rash or wound.  Neurological:     General: No focal deficit present.     Mental Status: She is alert and oriented to person, place, and time.     GCS: GCS eye subscore is 4. GCS verbal subscore is 5. GCS motor subscore is 6.     Cranial Nerves: Cranial nerves 2-12 are intact.     Sensory: Sensation is intact.     Motor: Motor function is intact.     Coordination: Coordination is intact.     Comments: Extraocular muscle movement: normal No visual field cut Pupils: equal and reactive both direct and consensual response is normal No nystagmus present    Sensory function is intact to light touch, pinprick Proprioception intact  Grip strength 5/5 symmetric in upper extremities No pronator drift Normal finger to nose bilaterally  Lower extremity strength 5/5 against gravity  Psychiatric:        Attention and Perception: Attention normal.        Mood and Affect: Mood normal.        Speech: Speech normal.        Behavior: Behavior normal.     ED Results / Procedures / Treatments   Labs (all labs ordered are listed, but only abnormal results are displayed) Labs Reviewed  CBC WITH DIFFERENTIAL/PLATELET - Abnormal; Notable for the following components:      Result Value   Hemoglobin 11.8 (*)    HCT 35.1 (*)    All other components within normal limits  BASIC METABOLIC PANEL - Abnormal; Notable for the following components:   Sodium 130 (*)    Chloride 95 (*)    Glucose, Bld 136 (*)    Calcium 8.8 (*)    All other components within normal limits  URINALYSIS, ROUTINE W REFLEX MICROSCOPIC - Abnormal; Notable for the following components:   Color, Urine STRAW (*)    All other components within normal limits  SARS CORONAVIRUS 2 BY RT PCR  LACTIC ACID, PLASMA     EKG EKG Interpretation  Date/Time:  Tuesday Dec 03 2022 04:06:06 EDT Ventricular Rate:  74 PR Interval:  184 QRS Duration: 118 QT Interval:  416 QTC Calculation: 462 R Axis:   -27 Text Interpretation: Sinus rhythm Incomplete RBBB and LAFB Low voltage,  precordial leads No significant change since last tracing Confirmed by Gilda Crease [phone]) on 12/03/2022 4:13:50 AM  Radiology CT ANGIO HEAD NECK W WO CM  Result Date: 12/03/2022 CLINICAL DATA:  Central vertigo. Headaches with nausea and dizziness EXAM: CT ANGIOGRAPHY HEAD AND NECK WITH AND WITHOUT CONTRAST TECHNIQUE: Multidetector CT imaging of the head and neck was performed using the standard protocol during bolus administration of intravenous contrast. Multiplanar CT image reconstructions and MIPs were obtained to evaluate the vascular anatomy. Carotid stenosis measurements (when applicable) are obtained utilizing NASCET criteria, using the distal internal carotid diameter as the denominator. RADIATION DOSE REDUCTION: This exam was performed according to the departmental dose-optimization program which includes automated exposure control, adjustment of the mA and/or kV according to patient size and/or use of iterative reconstruction technique. CONTRAST:  75mL OMNIPAQUE IOHEXOL 350 MG/ML SOLN COMPARISON:  08/16/2021 neck CT. FINDINGS: CT HEAD FINDINGS Brain: No evidence of acute infarction, hemorrhage, hydrocephalus, extra-axial collection or mass lesion/mass effect. Vascular: No hyperdense vessel or unexpected calcification. Skull: Normal. Negative for fracture or focal lesion. Sinuses/Orbits: No acute finding. Review of the MIP images confirms the above findings CTA NECK FINDINGS Aortic arch: Negative Right carotid system: Vessels are smoothly contoured and widely patent. Mild atheromatous plaque at the bifurcation Left carotid system: Vessels are smoothly contoured and widely patent. Mild atheromatous plaque at the bifurcation  Vertebral arteries: No proximal subclavian or vertebral stenosis. Vertebral arteries are smoothly contoured. Skeleton: Degenerative spurring in the cervical spine especially affecting right-sided facets. No acute or aggressive finding Other neck: No acute or aggressive finding Upper chest: Clear apical lungs Review of the MIP images confirms the above findings CTA HEAD FINDINGS Anterior circulation: Atheromatous calcification of the carotid siphons. No associated flow limiting stenosis. No branch occlusion, beading, or aneurysm. Small right PCOM infundibulum. Posterior circulation: The vertebral and basilar arteries are smoothly contoured and diffusely patent. No branch occlusion, beading, or aneurysm. Venous sinuses: Diffusely patent Anatomic variants: None significant Review of the MIP images confirms the above findings IMPRESSION: 1. No emergent finding or explanation for symptoms. 2. Mild atherosclerosis without significant stenosis of major arteries in the head and neck. Electronically Signed   By: Tiburcio Pea M.D.   On: 12/03/2022 05:48    Procedures Procedures  {Document cardiac monitor, telemetry assessment procedure when appropriate:1}  Medications Ordered in ED Medications  ibuprofen (ADVIL) tablet 400 mg (has no administration in time range)  acetaminophen (TYLENOL) tablet 650 mg (has no administration in time range)  meclizine (ANTIVERT) tablet 25 mg (has no administration in time range)  sodium chloride 0.9 % bolus 500 mL (has no administration in time range)  iohexol (OMNIPAQUE) 350 MG/ML injection 75 mL (75 mLs Intravenous Contrast Given 12/03/22 0516)    ED Course/ Medical Decision Making/ A&P   {   Click here for ABCD2, HEART and other calculatorsREFRESH Note before signing :1}                          Medical Decision Making Amount and/or Complexity of Data Reviewed Labs: ordered. Radiology: ordered.  Risk OTC drugs. Prescription drug management.   Differential  Diagnosis considered includes, but not limited to: Hypertensive emergencies, Idiopathic intracranial hypertension, Carotid or vertebrobasilar dissection, Space occupying lesions (tumors, abscesses, cysts), Acute hydrocephalus, Dural sinus thrombosis, Intracranial hemorrhage, Giant cell (temporal) arteritis, Cerebrovascular accident or stroke, Meningitis and encephalitis, Migraine HA, Tension HA.  Patient presents with headache, dizziness, nausea and vomiting.  She  feels off balance.  Patient feels generalized weakness.  She has a normal, nonfocal neurologic exam.  Lab work has been unremarkable.  Vital signs are normal other than slight hypertension.  No leukocytosis, no anemia.  Electrolytes normal, renal function normal.  Normal lactic acid.  Urinalysis without abnormality.  Patient underwent CT angiography head and neck.  No acute abnormality is noted.  She has been ambulatory here in the department.  She reports that the dizziness is significantly improved but she still feels weak.  Will give IV fluids, monitor.  {Document critical care time when appropriate:1} {Document review of labs and clinical decision tools ie heart score, Chads2Vasc2 etc:1}  {Document your independent review of radiology images, and any outside records:1} {Document your discussion with family members, caretakers, and with consultants:1} {Document social determinants of health affecting pt's care:1} {Document your decision making why or why not admission, treatments were needed:1} Final Clinical Impression(s) / ED Diagnoses Final diagnoses:  Weakness  Dizziness    Rx / DC Orders ED Discharge Orders     None

## 2022-12-03 NOTE — ED Notes (Signed)
Patient transported to CT 

## 2022-12-06 ENCOUNTER — Other Ambulatory Visit: Payer: Self-pay | Admitting: Family Medicine

## 2022-12-06 DIAGNOSIS — R1319 Other dysphagia: Secondary | ICD-10-CM

## 2022-12-11 ENCOUNTER — Ambulatory Visit (INDEPENDENT_AMBULATORY_CARE_PROVIDER_SITE_OTHER): Payer: Medicare HMO | Admitting: Podiatry

## 2022-12-11 ENCOUNTER — Ambulatory Visit (INDEPENDENT_AMBULATORY_CARE_PROVIDER_SITE_OTHER): Payer: Medicare HMO | Admitting: Family Medicine

## 2022-12-11 ENCOUNTER — Encounter: Payer: Self-pay | Admitting: Family Medicine

## 2022-12-11 VITALS — BP 127/69 | HR 90 | Temp 97.8°F | Ht 63.0 in | Wt 146.5 lb

## 2022-12-11 DIAGNOSIS — B351 Tinea unguium: Secondary | ICD-10-CM | POA: Diagnosis not present

## 2022-12-11 DIAGNOSIS — M79674 Pain in right toe(s): Secondary | ICD-10-CM

## 2022-12-11 DIAGNOSIS — M2142 Flat foot [pes planus] (acquired), left foot: Secondary | ICD-10-CM

## 2022-12-11 DIAGNOSIS — E0843 Diabetes mellitus due to underlying condition with diabetic autonomic (poly)neuropathy: Secondary | ICD-10-CM | POA: Diagnosis not present

## 2022-12-11 DIAGNOSIS — R519 Headache, unspecified: Secondary | ICD-10-CM | POA: Diagnosis not present

## 2022-12-11 DIAGNOSIS — M79675 Pain in left toe(s): Secondary | ICD-10-CM

## 2022-12-11 DIAGNOSIS — R42 Dizziness and giddiness: Secondary | ICD-10-CM

## 2022-12-11 DIAGNOSIS — E1142 Type 2 diabetes mellitus with diabetic polyneuropathy: Secondary | ICD-10-CM

## 2022-12-11 NOTE — Progress Notes (Addendum)
Established Patient Office Visit  Subjective   Patient ID: Cassie Hunter, female    DOB: 1955/10/06  Age: 67 y.o. MRN: [id number]  Chief Complaint  Patient presents with   Dizziness    HPI She presented to the ER on 12/03/22 for dizziness, vertigo, nausea, and headache. It felt like the room was spinning and she felt very off balance. Dizziness was triggered by rolling over and head movement.   In the ER, CT of her head was head was normal. She reprots that dizziness has resolved. She has still has an intermittent headache. HA is usually in the back of her head. It sometimes radiates to the top of her head. She sometimes has starp shooting pains to the top of her head. This has happened 2-3x. She has been having some neck pain. She has gotten a new pillow and this has helped with her neck pain. Denies nausea or vomiting, weakness, changes in vision or speech. She is no longer taking meclizine since dizziness has resolved.   She had a follow up appt with podiatry earlier today for debridement of calluses and nails.She has an upcoming appt with podiatry for new diabetic shoes for hx of T2DM with neuropathy, pes planus of left foot, and symptomatic calluses.   ROS As per HPI.   Objective:     BP 127/69   Pulse 90   Temp 97.8 F (36.6 C) (Temporal)   Ht 5\' 3"  (1.6 m)   Wt 146 lb 8 oz (66.5 kg)   SpO2 97%   BMI 25.95 kg/m    Physical Exam Vitals and nursing note reviewed.  Constitutional:      Appearance: Normal appearance.  HENT:     Head: Normocephalic and atraumatic.     Mouth/Throat:     Mouth: Mucous membranes are moist.     Pharynx: Oropharynx is clear.  Eyes:     General:        Right eye: No discharge.        Left eye: No discharge.     Extraocular Movements: Extraocular movements intact.     Pupils: Pupils are equal, round, and reactive to light.  Cardiovascular:     Rate and Rhythm: Normal rate and regular rhythm.     Heart sounds: Normal heart sounds.  No murmur heard. Pulmonary:     Effort: Pulmonary effort is normal. No respiratory distress.     Breath sounds: Normal breath sounds.  Musculoskeletal:     Cervical back: Neck supple. No rigidity or tenderness.     Right lower leg: No edema.     Left lower leg: No edema.  Skin:    General: Skin is warm and dry.  Neurological:     Mental Status: She is alert and oriented to person, place, and time. Mental status is at baseline.     Cranial Nerves: No cranial nerve deficit.     Sensory: No sensory deficit.     Motor: No weakness.     Coordination: Coordination normal.  Psychiatric:        Mood and Affect: Mood normal.        Behavior: Behavior normal.      No results found for any visits on 12/11/22.    The 10-year ASCVD risk score (Arnett DK, et al., 2019) is: 14.4%    Assessment & Plan:   Cythnia was seen today for dizziness.  Diagnoses and all orders for this visit:  Vertigo Now resolved. Reviewed ER  note, imaging, and labs. Normal neuro exam today.   Nonintractable episodic headache, unspecified headache type Discussed symptomatic care and return precautions.   Acquired pes planus of left foot Foot callus Diabetic polyneuropathy associated with type 2 diabetes mellitus [facility]) Reviewed podiatry note. Had debridement today. Has upcoming appt for diabetic shoe fitting.    Return if symptoms worsen or fail to improve.   The patient indicates understanding of these issues and agrees with the plan.  Gabriel Earing, FNP

## 2022-12-11 NOTE — Progress Notes (Signed)
   Chief Complaint  Patient presents with   Callouses    Patient came in today for diabetic foot care, A1c- 5.8 BG- not taking, left hallux callus and nail fungus     HPI: 67 y.o. female presenting today to be reevaluated for new diabetic shoes and insoles.  Patient states that the diabetic shoes with custom molded insoles helped tremendously with her foot and ankle.  And collapse of her arch of the foot.  Patient also requesting routine diabetic footcare today.  She says that her toenails are long and tender and painful and she is unable to trim her own nails.  She also has symptomatic calluses to the bilateral feet.  Past Medical History:  Diagnosis Date   Diabetes mellitus    Difficulty swallowing    Diverticulitis    Essential hypertension    GERD (gastroesophageal reflux disease)    History of esophageal stricture     Past Surgical History:  Procedure Laterality Date   ABDOMINAL HYSTERECTOMY     APPENDECTOMY     CESAREAN SECTION  02/26/1988-07/01/1990   CHOLECYSTECTOMY     SPINAL CORD DECOMPRESSION  1996   TUBAL LIGATION  07/01/1990    Allergies  Allergen Reactions   Codeine Nausea Only and Other (See Comments)   Neosporin [Bacitracin-Polymyxin B] Rash     Physical Exam: General: The patient is alert and oriented x3 in no acute distress.  Dermatology: Skin is warm, dry and supple bilateral lower extremities. Negative for open lesions or macerations.  Hyperkeratotic dystrophic discolored toenails noted 1-5 bilateral.  There is also some hyperkeratotic preulcerative callus tissue noted to the bilateral feet  Vascular: Palpable pedal pulses bilaterally. Capillary refill within normal limits.  Negative for any significant edema or erythema  Neurological: Light touch and protective threshold diminished  Musculoskeletal Exam: Symptomatic pes planovalgus deformity noted with PTTD left lower extremity.   Assessment: 1.  Pain due to onychomycosis of toenails both 2.  Pes  planovalgus / PTTD LLE 3.  Diabetes mellitus with peripheral polyneuropathy 4.  Symptomatic calluses bilateral feet  -Patient evaluated -Mechanical debridement of nails 1-5 bilateral was performed using a nail nipper without incident or bleeding -Appointment with diabetic shoe department for new diabetic shoes with custom molded Plastizote insoles.  Order placed -Excisional debridement of the hyperkeratotic callus tissue was also performed today using a 312 scalpel without incident or bleeding.  Patient did feel some relief -Return to clinic annually     Felecia Shelling, DPM [facility]  Dr. Felecia Shelling, DPM    [address] [place], [place] [address]                Office [phone]  Fax [phone]

## 2022-12-11 NOTE — Patient Instructions (Signed)
Vertigo Vertigo is the feeling that you or your surroundings are moving when they are not. This feeling can come and go at any time. Vertigo often goes away on its own. Vertigo can be dangerous if it occurs while you are doing something that could endanger yourself or others, such as driving or operating machinery. Your health care provider will do tests to try to determine the cause of your vertigo. Tests will also help your health care provider decide how best to treat your condition. Follow these instructions at home: Eating and drinking     Dehydration can make vertigo worse. Drink enough fluid to keep your urine pale yellow. Do not drink alcohol. Activity Return to your normal activities as told by your health care provider. Ask your health care provider what activities are safe for you. In the morning, first sit up on the side of the bed. When you feel okay, stand slowly while you hold onto something until you know that your balance is fine. Move slowly. Avoid sudden body or head movements or certain positions, as told by your health care provider. If you have trouble walking or keeping your balance, try using a cane for stability. If you feel dizzy or unstable, sit down right away. Avoid doing any tasks that would cause danger to you or others if vertigo occurs. Avoid bending down if you feel dizzy. Place items in your home so that they are easy for you to reach without bending or leaning over. Do not drive or use machinery if you feel dizzy. General instructions Take over-the-counter and prescription medicines only as told by your health care provider. Keep all follow-up visits. This is important. Contact a health care provider if: Your medicines do not relieve your vertigo or they make it worse. Your condition gets worse or you develop new symptoms. You have a fever. You develop nausea or vomiting, or if nausea gets worse. Your family or friends notice any behavioral changes. You  have numbness or a prickling and tingling sensation in part of your body. Get help right away if you: Are always dizzy or you faint. Develop severe headaches. Develop a stiff neck. Develop sensitivity to light. Have difficulty moving or speaking. Have weakness in your hands, arms, or legs. Have changes in your hearing or vision. These symptoms may represent a serious problem that is an emergency. Do not wait to see if the symptoms will go away. Get medical help right away. Call your local emergency services (911 in the [place] Do not drive yourself to the hospital. Summary Vertigo is the feeling that you or your surroundings are moving when they are not. Your health care provider will do tests to try to determine the cause of your vertigo. Follow instructions for home care. You may be told to avoid certain tasks, positions, or movements. Contact a health care provider if your medicines do not relieve your symptoms, or if you have a fever, nausea, vomiting, or changes in behavior. Get help right away if you have severe headaches or difficulty speaking, or you develop hearing or vision problems. This information is not intended to replace advice given to you by your health care provider. Make sure you discuss any questions you have with your health care provider. Document Revised: 05/31/2020 Document Reviewed: 05/31/2020 Elsevier Patient Education  2024 [employer].

## 2022-12-13 ENCOUNTER — Encounter: Payer: Self-pay | Admitting: Family Medicine

## 2022-12-17 ENCOUNTER — Ambulatory Visit (INDEPENDENT_AMBULATORY_CARE_PROVIDER_SITE_OTHER): Payer: Medicare HMO

## 2022-12-17 VITALS — Ht 63.0 in | Wt 146.0 lb

## 2022-12-17 DIAGNOSIS — Z0000 Encounter for general adult medical examination without abnormal findings: Secondary | ICD-10-CM | POA: Diagnosis not present

## 2022-12-17 NOTE — Progress Notes (Signed)
Subjective:   Cassie Hunter is a 67 y.o. female who presents for Medicare Annual (Subsequent) preventive examination. I connected with  [name] on 12/17/22 by a audio enabled telemedicine application and verified that I am speaking with the correct person using two identifiers.  Patient Location: Home  Provider Location: Home Office  I discussed the limitations of evaluation and management by telemedicine. The patient expressed understanding and agreed to proceed.  Review of Systems     Cardiac Risk Factors include: advanced age (>75men, >41 women);diabetes mellitus;hypertension     Objective:    Today's Vitals   12/17/22 0806  Weight: 146 lb (66.2 kg)  Height: 5\' 3"  (1.6 m)   Body mass index is 25.86 kg/m.     12/17/2022    8:09 AM 12/03/2022    4:01 AM 12/13/2021    9:17 AM 10/25/2021    3:15 PM 08/16/2021    5:36 PM 12/08/2020    8:30 AM 11/05/2015    7:08 PM  Advanced Directives  Does Patient Have a Medical Advance Directive? No No No No No No No  Would patient like information on creating a medical advance directive? No - Patient declined  No - Patient declined  No - Patient declined No - Patient declined No - patient declined information    Current Medications (verified) Outpatient Encounter Medications as of 12/17/2022  Medication Sig   albuterol (VENTOLIN HFA) 108 (90 Base) MCG/ACT inhaler Inhale 2 puffs into the lungs every 6 (six) hours as needed.   Alcohol Swabs (B-D SINGLE USE SWABS REGULAR) PADS Test BS daily and as needed Dx E11.9   aspirin 81 MG EC tablet Take 1 tablet (81 mg total) by mouth daily. Swallow whole.   Blood Glucose Calibration (TRUE METRIX LEVEL 1) Low SOLN Use with glucose monitor Dx E11.9   diltiazem (CARDIZEM CD) 240 MG 24 hr capsule Take 1 capsule (240 mg total) by mouth daily.   diltiazem (CARDIZEM) 30 MG tablet Take 1 tablet (30 mg total) by mouth 4 (four) times daily as needed.   glucose blood (TRUE METRIX BLOOD GLUCOSE  TEST) test strip Test BS daily and as needed Dx E11.9   losartan (COZAAR) 100 MG tablet TAKE 1 TABLET EVERY DAY   meclizine (ANTIVERT) 25 MG tablet Take 1 tablet (25 mg total) by mouth 3 (three) times daily as needed for dizziness.   metFORMIN (GLUCOPHAGE) 500 MG tablet Take 2 tablets (1,000 mg total) by mouth daily with breakfast AND 1 tablet (500 mg total) daily with supper.   omeprazole (PRILOSEC) 40 MG capsule TAKE 1 CAPSULE BY MOUTH TWICE DAILY BEFORE A MEAL   ondansetron (ZOFRAN-ODT) 4 MG disintegrating tablet Take 1 tablet (4 mg total) by mouth every 8 (eight) hours as needed for nausea or vomiting.   simvastatin (ZOCOR) 20 MG tablet TAKE 1 TABLET EVERY DAY AT 6 PM   TRUEplus Lancets 33G MISC Test BS daily and as needed Dx E11.9   No facility-administered encounter medications on file as of 12/17/2022.    Allergies (verified) Codeine and Neosporin [bacitracin-polymyxin b]   History: Past Medical History:  Diagnosis Date   Diabetes mellitus    Difficulty swallowing    Diverticulitis    Essential hypertension    GERD (gastroesophageal reflux disease)    History of esophageal stricture    Past Surgical History:  Procedure Laterality Date   ABDOMINAL HYSTERECTOMY     APPENDECTOMY     CESAREAN SECTION  02/26/1988-07/01/1990  CHOLECYSTECTOMY     SPINAL CORD DECOMPRESSION  1996   TUBAL LIGATION  07/01/1990   Family History  Problem Relation Age of Onset   Pancreatic cancer Father    Lung cancer Father    Cancer Father    CVA Mother    Stroke Mother    Spina bifida Sister    Heart attack Maternal Grandmother    Heart attack Maternal Grandfather    Stroke Paternal Grandmother    Aneurysm Paternal Grandfather    ADD / ADHD Son    Asthma Son    Social History   Socioeconomic History   Marital status: Married    Spouse name: Peyton Najjar   Number of children: 2   Years of education: Not on file   Highest education level: GED or equivalent  Occupational History    Occupation: Retired Geophysicist/field seismologist  Tobacco Use   Smoking status: Never   Smokeless tobacco: Never  Vaping Use   Vaping Use: Never used  Substance and Sexual Activity   Alcohol use: No   Drug use: No   Sexual activity: Not Currently    Birth control/protection: Surgical  Other Topics Concern   Not on file  Social History Narrative   Lives with her husband in one level home   Daughter lives 2 blocks away   Social Determinants of Health   Financial Resource Strain: Low Risk  (12/17/2022)   Overall Financial Resource Strain (CARDIA)    Difficulty of Paying Living Expenses: Not hard at all  Food Insecurity: Patient Declined (12/10/2022)   Hunger Vital Sign    Worried About Running Out of Food in the Last Year: Patient declined    Ran Out of Food in the Last Year: Patient declined  Transportation Needs: No Transportation Needs (12/17/2022)   PRAPARE - Administrator, Civil Service (Medical): No    Lack of Transportation (Non-Medical): No  Physical Activity: Inactive (12/17/2022)   Exercise Vital Sign    Days of Exercise per Week: 0 days    Minutes of Exercise per Session: 0 min  Stress: No Stress Concern Present (12/17/2022)   [employer] of Occupational Health - Occupational Stress Questionnaire    Feeling of Stress : Not at all  Social Connections: Moderately Isolated (12/17/2022)   Social Connection and Isolation Panel [NHANES]    Frequency of Communication with Friends and Family: More than three times a week    Frequency of Social Gatherings with Friends and Family: More than three times a week    Attends Religious Services: Never    Database administrator or Organizations: No    Attends Engineer, structural: Never    Marital Status: Married    Tobacco Counseling Counseling given: Not Answered   Clinical Intake:  Pre-visit preparation completed: Yes  Pain : No/denies pain     Nutritional Risks: None Diabetes: No  How often do you need  to have someone help you when you read instructions, pamphlets, or other written materials from your doctor or pharmacy?: 1 - Never  Diabetic?yes Nutrition Risk Assessment:  Has the patient had any N/V/D within the last 2 months?  No  Does the patient have any non-healing wounds?  No  Has the patient had any unintentional weight loss or weight gain?  No   Diabetes:  Is the patient diabetic?  Yes  If diabetic, was a CBG obtained today?  No  Did the patient bring in their glucometer from home?  No  How often do you monitor your CBG's? 3 x week .   Financial Strains and Diabetes Management:  Are you having any financial strains with the device, your supplies or your medication? No .  Does the patient want to be seen by Chronic Care Management for management of their diabetes?  No  Would the patient like to be referred to a Nutritionist or for Diabetic Management?  No   Diabetic Exams:  Diabetic Eye Exam: Completed 03/2022 Diabetic Foot Exam: Overdue, Pt has been advised about the importance in completing this exam. Pt is scheduled for diabetic foot exam on next office visit .   Interpreter Needed?: No  Information entered by :: Renie Ora, LPN   Activities of Daily Living    12/17/2022    8:10 AM 12/15/2022    9:03 PM  In your present state of health, do you have any difficulty performing the following activities:  Hearing? 0 0  Vision? 0 0  Difficulty concentrating or making decisions? 0 0  Walking or climbing stairs? 0 0  Dressing or bathing? 0 0  Doing errands, shopping? 0 0  Preparing Food and eating ? N N  Using the Toilet? N N  In the past six months, have you accidently leaked urine? N N  Do you have problems with loss of bowel control? N N  Managing your Medications? N N  Managing your Finances? N N  Housekeeping or managing your Housekeeping? N N    Patient Care Team: Gabriel Earing, FNP as PCP - General (Family Medicine) Kathleene Hazel, MD as  PCP - Cardiology (Cardiology) Delora Fuel, OD (Optometry)  Indicate any recent Medical Services you may have received from other than [facility] providers in the past year (date may be approximate).     Assessment:   This is a routine wellness examination for Micha.  Hearing/Vision screen Vision Screening - Comments:: Wears rx glasses - up to date with routine eye exams with  Dr. Laural Benes  Dietary issues and exercise activities discussed: Current Exercise Habits: The patient does not participate in regular exercise at present   Goals Addressed             This Visit's Progress    DIET - INCREASE WATER INTAKE         Depression Screen    12/17/2022    8:08 AM 12/11/2022    3:06 PM 08/22/2022    7:58 AM 03/20/2022   11:25 AM 03/04/2022    9:54 AM 02/19/2022    9:01 AM 12/13/2021    2:02 PM  PHQ 2/9 Scores  PHQ - 2 Score 0 0 0 0 0 0 0  PHQ- 9 Score 0 0 0    0    Fall Risk    12/17/2022    8:07 AM 12/15/2022    9:03 PM 12/11/2022    3:06 PM 08/22/2022    7:57 AM 03/20/2022   11:25 AM  Fall Risk   Falls in the past year? 1 1 0 1 0  Number falls in past yr: 0 0  0   Injury with Fall? 1 1  1    Risk for fall due to :    History of fall(s)   Follow up Falls evaluation completed;Education provided;Falls prevention discussed   Falls evaluation completed     FALL RISK PREVENTION PERTAINING TO THE HOME:  Any stairs in or around the home? No  If so, are there any without handrails?  No  Home free of loose throw rugs in walkways, pet beds, electrical cords, etc? Yes  Adequate lighting in your home to reduce risk of falls? Yes   ASSISTIVE DEVICES UTILIZED TO PREVENT FALLS:  Life alert? No  Use of a cane, walker or w/c? No  Grab bars in the bathroom? Yes  Shower chair or bench in shower? No  Elevated toilet seat or a handicapped toilet? No       12/08/2020    8:32 AM  MMSE - Mini Mental State Exam  Orientation to time 5  Orientation to Place 5  Registration 3  Attention/  Calculation 5  Recall 2  Language- name 2 objects 2  Language- repeat 1  Language- follow 3 step command 3  Language- read & follow direction 1  Write a sentence 1  Copy design 1  Total score 29        12/17/2022    8:10 AM 12/13/2021    9:19 AM  6CIT Screen  What Year? 0 points 0 points  What month? 0 points 0 points  What time? 0 points 0 points  Count back from 20 0 points 0 points  Months in reverse 0 points 0 points  Repeat phrase 0 points 0 points  Total Score 0 points 0 points    Immunizations Immunization History  Administered Date(s) Administered   Fluad Quad(high Dose 65+) 06/15/2020, 05/10/2021, 04/09/2022   Influenza, Seasonal, Injecte, Preservative Fre 06/01/2014   Influenza,inj,Quad PF,6+ Mos 06/01/2014, 05/02/2017, 04/30/2018, 06/22/2019   Influenza-Unspecified 06/01/2014, 05/02/2017, 04/30/2018   PFIZER(Purple Top)SARS-COV-2 Vaccination 03/22/2020, 04/12/2020   Pneumococcal Conjugate-13 10/12/2020   Pneumococcal Polysaccharide-23 05/27/2017   Tdap 07/16/2015    TDAP status: Up to date  Flu Vaccine status: Up to date  Pneumococcal vaccine status: Up to date  Covid-19 vaccine status: Completed vaccines  Qualifies for Shingles Vaccine? Yes   Zostavax completed No   Shingrix Completed?: No.    Education has been provided regarding the importance of this vaccine. Patient has been advised to call insurance company to determine out of pocket expense if they have not yet received this vaccine. Advised may also receive vaccine at local pharmacy or Health Dept. Verbalized acceptance and understanding.  Screening Tests Health Maintenance  Topic Date Due   OPHTHALMOLOGY EXAM  02/21/2022   Diabetic kidney evaluation - Urine ACR  05/15/2022   MAMMOGRAM  09/13/2022   COVID-19 Vaccine (3 - 2023-24 season) 09/08/2023 (Originally 03/15/2022)   Zoster Vaccines- Shingrix (1 of 2) 11/20/2023 (Originally 01/08/2005)   INFLUENZA VACCINE  02/13/2023   FOOT EXAM  02/20/2023    HEMOGLOBIN A1C  02/20/2023   Fecal DNA (Cologuard)  05/01/2023   Diabetic kidney evaluation - eGFR measurement  12/03/2023   Medicare Annual Wellness (AWV)  12/17/2023   DEXA SCAN  05/08/2025   DTaP/Tdap/Td (2 - Td or Tdap) 07/15/2025   Pneumonia Vaccine 76+ Years old (3 of 3 - PPSV23 or PCV20) 10/12/2025   Hepatitis C Screening  Completed   HPV VACCINES  Aged Out   Colonoscopy  Discontinued    Health Maintenance  Health Maintenance Due  Topic Date Due   OPHTHALMOLOGY EXAM  02/21/2022   Diabetic kidney evaluation - Urine ACR  05/15/2022   MAMMOGRAM  09/13/2022    Colorectal cancer screening: Type of screening: Colonoscopy. Completed 05/06/2022. Repeat every 10 years  Mammogram status: Ordered scheduled 01/08/2023. Pt provided with contact info and advised to call to schedule appt.   Bone Density status: Completed  05/08/2020. Results reflect: Bone density results: OSTEOPENIA. Repeat every 5 years.  Lung Cancer Screening: (Low Dose CT Chest recommended if Age 60-80 years, 30 pack-year currently smoking OR have quit w/in 15years.) does not qualify.   Lung Cancer Screening Referral: n/a  Additional Screening:  Hepatitis C Screening: does not qualify; Completed 01/02/2017  Vision Screening: Recommended annual ophthalmology exams for early detection of glaucoma and other disorders of the eye. Is the patient up to date with their annual eye exam?  Yes  Who is the provider or what is the name of the office in which the patient attends annual eye exams? Dr.Johnson  If pt is not established with a provider, would they like to be referred to a provider to establish care? No .   Dental Screening: Recommended annual dental exams for proper oral hygiene  Community Resource Referral / Chronic Care Management: CRR required this visit?  No   CCM required this visit?  No      Plan:     I have personally reviewed and noted the following in the patient's chart:   Medical and  social history Use of alcohol, tobacco or illicit drugs  Current medications and supplements including opioid prescriptions. Patient is not currently taking opioid prescriptions. Functional ability and status Nutritional status Physical activity Advanced directives List of other physicians Hospitalizations, surgeries, and ER visits in previous 12 months Vitals Screenings to include cognitive, depression, and falls Referrals and appointments  In addition, I have reviewed and discussed with patient certain preventive protocols, quality metrics, and best practice recommendations. A written personalized care plan for preventive services as well as general preventive health recommendations were provided to patient.     Lorrene Reid, LPN   07/20/1094   Nurse Notes: none

## 2022-12-17 NOTE — Patient Instructions (Signed)
Cassie Hunter , Thank you for taking time to come for your Medicare Wellness Visit. I appreciate your ongoing commitment to your health goals. Please review the following plan we discussed and let me know if I can assist you in the future.   These are the goals we discussed:  Goals       Control Diabetes (pt-stated)      Control diabetes.       DIET - INCREASE WATER INTAKE      Patient Stated      Wants to get back to be able to walk 3 miles per day 3-4 days per week Right now only tolerating 1 mile        This is a list of the screening recommended for you and due dates:  Health Maintenance  Topic Date Due   Eye exam for diabetics  02/21/2022   Yearly kidney health urinalysis for diabetes  05/15/2022   Mammogram  09/13/2022   COVID-19 Vaccine (3 - 2023-24 season) 09/08/2023*   Zoster (Shingles) Vaccine (1 of 2) 11/20/2023*   Flu Shot  02/13/2023   Complete foot exam   02/20/2023   Hemoglobin A1C  02/20/2023   Cologuard (Stool DNA test)  05/01/2023   Yearly kidney function blood test for diabetes  12/03/2023   Medicare Annual Wellness Visit  12/17/2023   DEXA scan (bone density measurement)  05/08/2025   DTaP/Tdap/Td vaccine (2 - Td or Tdap) 07/15/2025   Pneumonia Vaccine (3 of 3 - PPSV23 or PCV20) 10/12/2025   Hepatitis C Screening  Completed   HPV Vaccine  Aged Out   Colon Cancer Screening  Discontinued  *Topic was postponed. The date shown is not the original due date.    Advanced directives: Advance directive discussed with you today. I have provided a copy for you to complete at home and have notarized. Once this is complete please bring a copy in to our office so we can scan it into your chart.   Conditions/risks identified: Aim for 30 minutes of exercise or brisk walking, 6-8 glasses of water, and 5 servings of fruits and vegetables each day.   Next appointment: Follow up in one year for your annual wellness visit    Preventive Care 65 Years and Older,  Female Preventive care refers to lifestyle choices and visits with your health care provider that can promote health and wellness. What does preventive care include? A yearly physical exam. This is also called an annual well check. Dental exams once or twice a year. Routine eye exams. Ask your health care provider how often you should have your eyes checked. Personal lifestyle choices, including: Daily care of your teeth and gums. Regular physical activity. Eating a healthy diet. Avoiding tobacco and drug use. Limiting alcohol use. Practicing safe sex. Taking low-dose aspirin every day. Taking vitamin and mineral supplements as recommended by your health care provider. What happens during an annual well check? The services and screenings done by your health care provider during your annual well check will depend on your age, overall health, lifestyle risk factors, and family history of disease. Counseling  Your health care provider may ask you questions about your: Alcohol use. Tobacco use. Drug use. Emotional well-being. Home and relationship well-being. Sexual activity. Eating habits. History of falls. Memory and ability to understand (cognition). Work and work Astronomer. Reproductive health. Screening  You may have the following tests or measurements: Height, weight, and BMI. Blood pressure. Lipid and cholesterol levels. These may be checked  every 5 years, or more frequently if you are over 50 years old. Skin check. Lung cancer screening. You may have this screening every year starting at age 74 if you have a 30-pack-year history of smoking and currently smoke or have quit within the past 15 years. Fecal occult blood test (FOBT) of the stool. You may have this test every year starting at age 60. Flexible sigmoidoscopy or colonoscopy. You may have a sigmoidoscopy every 5 years or a colonoscopy every 10 years starting at age 47. Hepatitis C blood test. Hepatitis B blood  test. Sexually transmitted disease (STD) testing. Diabetes screening. This is done by checking your blood sugar (glucose) after you have not eaten for a while (fasting). You may have this done every 1-3 years. Bone density scan. This is done to screen for osteoporosis. You may have this done starting at age 70. Mammogram. This may be done every 1-2 years. Talk to your health care provider about how often you should have regular mammograms. Talk with your health care provider about your test results, treatment options, and if necessary, the need for more tests. Vaccines  Your health care provider may recommend certain vaccines, such as: Influenza vaccine. This is recommended every year. Tetanus, diphtheria, and acellular pertussis (Tdap, Td) vaccine. You may need a Td booster every 10 years. Zoster vaccine. You may need this after age 23. Pneumococcal 13-valent conjugate (PCV13) vaccine. One dose is recommended after age 84. Pneumococcal polysaccharide (PPSV23) vaccine. One dose is recommended after age 29. Talk to your health care provider about which screenings and vaccines you need and how often you need them. This information is not intended to replace advice given to you by your health care provider. Make sure you discuss any questions you have with your health care provider. Document Released: 07/28/2015 Document Revised: 03/20/2016 Document Reviewed: 05/02/2015 Elsevier Interactive Patient Education  2017 [employer].  Fall Prevention in the Home Falls can cause injuries. They can happen to people of all ages. There are many things you can do to make your home safe and to help prevent falls. What can I do on the outside of my home? Regularly fix the edges of walkways and driveways and fix any cracks. Remove anything that might make you trip as you walk through a door, such as a raised step or threshold. Trim any bushes or trees on the path to your home. Use bright outdoor  lighting. Clear any walking paths of anything that might make someone trip, such as rocks or tools. Regularly check to see if handrails are loose or broken. Make sure that both sides of any steps have handrails. Any raised decks and porches should have guardrails on the edges. Have any leaves, snow, or ice cleared regularly. Use sand or salt on walking paths during winter. Clean up any spills in your garage right away. This includes oil or grease spills. What can I do in the bathroom? Use night lights. Install grab bars by the toilet and in the tub and shower. Do not use towel bars as grab bars. Use non-skid mats or decals in the tub or shower. If you need to sit down in the shower, use a plastic, non-slip stool. Keep the floor dry. Clean up any water that spills on the floor as soon as it happens. Remove soap buildup in the tub or shower regularly. Attach bath mats securely with double-sided non-slip rug tape. Do not have throw rugs and other things on the floor that can  make you trip. What can I do in the bedroom? Use night lights. Make sure that you have a light by your bed that is easy to reach. Do not use any sheets or blankets that are too big for your bed. They should not hang down onto the floor. Have a firm chair that has side arms. You can use this for support while you get dressed. Do not have throw rugs and other things on the floor that can make you trip. What can I do in the kitchen? Clean up any spills right away. Avoid walking on wet floors. Keep items that you use a lot in easy-to-reach places. If you need to reach something above you, use a strong step stool that has a grab bar. Keep electrical cords out of the way. Do not use floor polish or wax that makes floors slippery. If you must use wax, use non-skid floor wax. Do not have throw rugs and other things on the floor that can make you trip. What can I do with my stairs? Do not leave any items on the stairs. Make  sure that there are handrails on both sides of the stairs and use them. Fix handrails that are broken or loose. Make sure that handrails are as long as the stairways. Check any carpeting to make sure that it is firmly attached to the stairs. Fix any carpet that is loose or worn. Avoid having throw rugs at the top or bottom of the stairs. If you do have throw rugs, attach them to the floor with carpet tape. Make sure that you have a light switch at the top of the stairs and the bottom of the stairs. If you do not have them, ask someone to add them for you. What else can I do to help prevent falls? Wear shoes that: Do not have high heels. Have rubber bottoms. Are comfortable and fit you well. Are closed at the toe. Do not wear sandals. If you use a stepladder: Make sure that it is fully opened. Do not climb a closed stepladder. Make sure that both sides of the stepladder are locked into place. Ask someone to hold it for you, if possible. Clearly mark and make sure that you can see: Any grab bars or handrails. First and last steps. Where the edge of each step is. Use tools that help you move around (mobility aids) if they are needed. These include: Canes. Walkers. Scooters. Crutches. Turn on the lights when you go into a dark area. Replace any light bulbs as soon as they burn out. Set up your furniture so you have a clear path. Avoid moving your furniture around. If any of your floors are uneven, fix them. If there are any pets around you, be aware of where they are. Review your medicines with your doctor. Some medicines can make you feel dizzy. This can increase your chance of falling. Ask your doctor what other things that you can do to help prevent falls. This information is not intended to replace advice given to you by your health care provider. Make sure you discuss any questions you have with your health care provider. Document Released: 04/27/2009 Document Revised: 12/07/2015  Document Reviewed: 08/05/2014 Elsevier Interactive Patient Education  2017 [employer].

## 2022-12-19 ENCOUNTER — Ambulatory Visit: Payer: Medicare HMO

## 2023-01-02 ENCOUNTER — Other Ambulatory Visit: Payer: Medicare HMO

## 2023-01-08 ENCOUNTER — Ambulatory Visit: Payer: Medicare HMO

## 2023-01-13 ENCOUNTER — Other Ambulatory Visit: Payer: Self-pay | Admitting: *Deleted

## 2023-01-13 MED ORDER — TRUE METRIX BLOOD GLUCOSE TEST VI STRP: ORAL_STRIP | 3 refills | Status: AC

## 2023-01-13 MED ORDER — TRUEPLUS LANCETS 33G MISC: 3 refills | Status: DC

## 2023-01-13 MED ORDER — TRUE METRIX AIR GLUCOSE METER W/DEVICE KIT: PACK | 0 refills | Status: DC

## 2023-01-13 MED ORDER — BD SWAB SINGLE USE REGULAR PADS: MEDICATED_PAD | 3 refills | Status: DC

## 2023-01-23 ENCOUNTER — Ambulatory Visit: Payer: Medicare HMO

## 2023-01-23 ENCOUNTER — Ambulatory Visit (INDEPENDENT_AMBULATORY_CARE_PROVIDER_SITE_OTHER): Payer: Medicare HMO | Admitting: Podiatry

## 2023-01-23 DIAGNOSIS — M2142 Flat foot [pes planus] (acquired), left foot: Secondary | ICD-10-CM

## 2023-01-23 DIAGNOSIS — E0843 Diabetes mellitus due to underlying condition with diabetic autonomic (poly)neuropathy: Secondary | ICD-10-CM

## 2023-01-23 NOTE — Progress Notes (Signed)
Patient presents today to be measured for  diabetic shoes and insoles.  Patient was measured for  1 pair of diabetic shoes and 3 pairs of foam casted diabetic insoles.  Ht 5'3 Wt 146 Shoe size 6.5 m Shoe type 844  Treating physician Harlow Mares     Re-appointment for regularly scheduled diabetic foot care visits or if they should experience any trouble with the shoes or insoles.

## 2023-01-27 DIAGNOSIS — M2142 Flat foot [pes planus] (acquired), left foot: Secondary | ICD-10-CM | POA: Insufficient documentation

## 2023-01-28 ENCOUNTER — Ambulatory Visit: Payer: Medicare HMO

## 2023-02-05 DIAGNOSIS — R1319 Other dysphagia: Secondary | ICD-10-CM | POA: Diagnosis not present

## 2023-02-05 DIAGNOSIS — Z8719 Personal history of other diseases of the digestive system: Secondary | ICD-10-CM | POA: Diagnosis not present

## 2023-02-05 DIAGNOSIS — R1310 Dysphagia, unspecified: Secondary | ICD-10-CM | POA: Diagnosis not present

## 2023-02-06 ENCOUNTER — Other Ambulatory Visit: Payer: Self-pay | Admitting: Family Medicine

## 2023-02-06 DIAGNOSIS — E1165 Type 2 diabetes mellitus with hyperglycemia: Secondary | ICD-10-CM

## 2023-02-06 DIAGNOSIS — I10 Essential (primary) hypertension: Secondary | ICD-10-CM

## 2023-02-17 ENCOUNTER — Ambulatory Visit
Admission: RE | Admit: 2023-02-17 | Discharge: 2023-02-17 | Disposition: A | Discharge status: Home / Self Care | Payer: Medicare HMO | Source: Ambulatory Visit | Attending: Family Medicine | Admitting: Family Medicine

## 2023-02-17 DIAGNOSIS — Z1231 Encounter for screening mammogram for malignant neoplasm of breast: Secondary | ICD-10-CM

## 2023-02-21 ENCOUNTER — Ambulatory Visit (INDEPENDENT_AMBULATORY_CARE_PROVIDER_SITE_OTHER): Payer: Medicare HMO | Admitting: Family Medicine

## 2023-02-21 ENCOUNTER — Encounter: Payer: Self-pay | Admitting: Family Medicine

## 2023-02-21 VITALS — BP 137/70 | HR 78 | Temp 98.0°F | Ht 63.0 in | Wt 149.1 lb

## 2023-02-21 DIAGNOSIS — E1165 Type 2 diabetes mellitus with hyperglycemia: Secondary | ICD-10-CM

## 2023-02-21 DIAGNOSIS — E1159 Type 2 diabetes mellitus with other circulatory complications: Secondary | ICD-10-CM

## 2023-02-21 DIAGNOSIS — M2142 Flat foot [pes planus] (acquired), left foot: Secondary | ICD-10-CM

## 2023-02-21 DIAGNOSIS — E1169 Type 2 diabetes mellitus with other specified complication: Secondary | ICD-10-CM

## 2023-02-21 DIAGNOSIS — K219 Gastro-esophageal reflux disease without esophagitis: Secondary | ICD-10-CM

## 2023-02-21 DIAGNOSIS — E785 Hyperlipidemia, unspecified: Secondary | ICD-10-CM | POA: Diagnosis not present

## 2023-02-21 DIAGNOSIS — I152 Hypertension secondary to endocrine disorders: Secondary | ICD-10-CM

## 2023-02-21 DIAGNOSIS — E034 Atrophy of thyroid (acquired): Secondary | ICD-10-CM

## 2023-02-21 DIAGNOSIS — R1319 Other dysphagia: Secondary | ICD-10-CM

## 2023-02-21 DIAGNOSIS — I700 Atherosclerosis of aorta: Secondary | ICD-10-CM

## 2023-02-21 DIAGNOSIS — E1142 Type 2 diabetes mellitus with diabetic polyneuropathy: Secondary | ICD-10-CM

## 2023-02-21 DIAGNOSIS — M5030 Other cervical disc degeneration, unspecified cervical region: Secondary | ICD-10-CM | POA: Diagnosis not present

## 2023-02-21 DIAGNOSIS — I471 Supraventricular tachycardia, unspecified: Secondary | ICD-10-CM

## 2023-02-21 DIAGNOSIS — E538 Deficiency of other specified B group vitamins: Secondary | ICD-10-CM

## 2023-02-21 LAB — LIPID PANEL

## 2023-02-21 LAB — CMP14+EGFR

## 2023-02-21 LAB — CBC WITH DIFFERENTIAL/PLATELET
Basophils Absolute: 0 10*3/uL (ref 0.0–0.2)
Basos: 1 %
EOS (ABSOLUTE): 0.3 10*3/uL (ref 0.0–0.4)
Eos: 5 %
Hematocrit: 33.4 % — ABNORMAL LOW (ref 34.0–46.6)
Hemoglobin: 11.5 g/dL (ref 11.1–15.9)
Immature Grans (Abs): 0 10*3/uL (ref 0.0–0.1)
Immature Granulocytes: 0 %
Lymphocytes Absolute: 1.2 10*3/uL (ref 0.7–3.1)
Lymphs: 24 %
MCH: 28.8 pg (ref 26.6–33.0)
MCHC: 34.4 g/dL (ref 31.5–35.7)
MCV: 84 fL (ref 79–97)
Monocytes Absolute: 0.4 10*3/uL (ref 0.1–0.9)
Monocytes: 7 %
Neutrophils Absolute: 3.2 10*3/uL (ref 1.4–7.0)
Neutrophils: 63 %
Platelets: 357 10*3/uL (ref 150–450)
RBC: 3.99 x10E6/uL (ref 3.77–5.28)
RDW: 12.1 % (ref 11.7–15.4)
WBC: 5.1 10*3/uL (ref 3.4–10.8)

## 2023-02-21 LAB — VITAMIN B12

## 2023-02-21 LAB — BAYER DCA HB A1C WAIVED: HB A1C (BAYER DCA - WAIVED): 5.7 % — ABNORMAL HIGH (ref 4.8–5.6)

## 2023-02-21 LAB — TSH

## 2023-02-21 NOTE — Progress Notes (Unsigned)
Established Patient Office Visit  Subjective   Patient ID: Cassie Hunter, female    DOB: Aug 24, 1955  Age: 67 y.o. MRN: [id number]  Chief Complaint  Patient presents with   Medical Management of Chronic Issues   Hyperlipidemia   Diabetes   Hypertension    HPI  T2DM Pt presents for follow up evaluation of Type 2 diabetes mellitus.  Current symptoms include paresthesia of the feet. Patient denies foot ulcerations, increased appetite, polydipsia, polyuria, visual disturbances, vomiting, and weight loss.  Current diabetic medications include: metformin Compliant with meds - Yes  Current monitoring regimen: home blood tests - daily Home blood sugar records: fasting range: 90-115, postprandial 107-135 Any episodes of hypoglycemia? no  2. HTN Complaint with meds - Yes Current Medications - losartan Pertinent ROS:  Visual Disturbances - No Chest pain - No Dyspnea - No Palpitations - No LE edema - No  3. GERD/dysphagia Has seen specialist in [facility]. She will be having 3 different procedures at [facility] to have her esophagus stretching because her strictures are so tight. Denies abdominal pain or vomiting. She is having some intermittent nausea.   4. Cervical DDD She is having daily neck pain that is now radiating up to her head and cause significant headaches. Pain worsens with activity and bending or rotating neck. Denies numbness or tingling. She has completed PT in the past. She has tried tylenol, heat without improvement.   Past Medical History:  Diagnosis Date   Diabetes mellitus    Difficulty swallowing    Diverticulitis    Essential hypertension    GERD (gastroesophageal reflux disease)    History of esophageal stricture       ROS As per HPI.    Objective:     BP 137/70   Pulse 78   Temp 98 F (36.7 C) (Temporal)   Ht 5\' 3"  (1.6 m)   Wt 149 lb 2 oz (67.6 kg)   SpO2 98%   BMI 26.42 kg/m  Wt Readings from Last 3 Encounters:  02/21/23 149 lb 2 oz  (67.6 kg)  12/17/22 146 lb (66.2 kg)  12/11/22 146 lb 8 oz (66.5 kg)      Physical Exam Vitals and nursing note reviewed.  Constitutional:      General: She is not in acute distress.    Appearance: Normal appearance. She is not ill-appearing, toxic-appearing or diaphoretic.  Cardiovascular:     Rate and Rhythm: Normal rate and regular rhythm.     Heart sounds: Normal heart sounds. No murmur heard. Pulmonary:     Effort: Pulmonary effort is normal. No respiratory distress.     Breath sounds: Normal breath sounds. No wheezing or rhonchi.  Abdominal:     General: Bowel sounds are normal. There is no distension.     Palpations: Abdomen is soft.     Tenderness: There is no abdominal tenderness. There is no guarding or rebound.  Musculoskeletal:     Cervical back: Neck supple. No rigidity.     Right lower leg: No edema.     Left lower leg: No edema.  Lymphadenopathy:     Cervical: No cervical adenopathy.  Skin:    General: Skin is warm and dry.  Neurological:     General: No focal deficit present.     Mental Status: She is alert and oriented to person, place, and time.     Motor: No weakness.     Gait: Gait normal.  Psychiatric:  Mood and Affect: Mood normal.        Behavior: Behavior normal.      No results found for any visits on 02/21/23.    The 10-year ASCVD risk score (Arnett DK, et al., 2019) is: 20.2%    Assessment & Plan:   Mkayla was seen today for medical management of chronic issues, hyperlipidemia, diabetes and hypertension.  Diagnoses and all orders for this visit:  Type 2 diabetes mellitus with hyperglycemia, without long-term current use of insulin (HCC) A1c 5.7 today, at goal of <7. Medication changes today: none. She is on an ACE/ARB and statin. Eye exam: has upcoming. Foot exam: today, also sees podiary. Urine micro: today. Diet and exercise.  -     Microalbumin / creatinine urine ratio -     Vitamin B12 -     Bayer DCA Hb A1c  Waived  Hypertension associated with diabetes (HCC) Well controlled on current regimen.  -     CBC with Differential/Platelet -     CMP14+EGFR  Dyslipidemia associated with type 2 diabetes mellitus (HCC) On statin. Fasting panel pending.  -     Lipid panel  Aortic atherosclerosis (HCC) On statin and aspirin.   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 Will check TSH today.  -     TSH  Esophageal dysphagia Has procedure schedule with [facility].   Gastroesophageal reflux disease, unspecified whether esophagitis present Continue PPI.   Supraventricular tachycardia On cardizem. Sees cardiology.   DDD (degenerative disc disease), cervical Referral to ortho for further management. Has failed PT and conservative measures.  -     Ambulatory referral to Orthopedic Surgery  Acquired pes planus of left foot Sees podiatry.    Return in about 3 months (around 05/24/2023) for chronic follow up.   The patient indicates understanding of these issues and agrees with the plan.  Gabriel Earing, FNP

## 2023-02-24 ENCOUNTER — Ambulatory Visit (INDEPENDENT_AMBULATORY_CARE_PROVIDER_SITE_OTHER): Payer: Medicare HMO

## 2023-02-24 DIAGNOSIS — E0843 Diabetes mellitus due to underlying condition with diabetic autonomic (poly)neuropathy: Secondary | ICD-10-CM | POA: Diagnosis not present

## 2023-02-24 DIAGNOSIS — M2142 Flat foot [pes planus] (acquired), left foot: Secondary | ICD-10-CM

## 2023-02-24 DIAGNOSIS — E119 Type 2 diabetes mellitus without complications: Secondary | ICD-10-CM | POA: Diagnosis not present

## 2023-02-24 NOTE — Progress Notes (Signed)

## 2023-03-04 ENCOUNTER — Other Ambulatory Visit: Payer: Self-pay | Admitting: Family Medicine

## 2023-03-04 DIAGNOSIS — R1319 Other dysphagia: Secondary | ICD-10-CM

## 2023-03-14 ENCOUNTER — Other Ambulatory Visit: Payer: Self-pay | Admitting: Family Medicine

## 2023-03-14 DIAGNOSIS — E1165 Type 2 diabetes mellitus with hyperglycemia: Secondary | ICD-10-CM

## 2023-03-14 DIAGNOSIS — I700 Atherosclerosis of aorta: Secondary | ICD-10-CM

## 2023-04-01 ENCOUNTER — Other Ambulatory Visit: Payer: Self-pay

## 2023-04-01 DIAGNOSIS — E1165 Type 2 diabetes mellitus with hyperglycemia: Secondary | ICD-10-CM

## 2023-04-04 ENCOUNTER — Other Ambulatory Visit: Payer: Self-pay | Admitting: Family Medicine

## 2023-04-04 DIAGNOSIS — I700 Atherosclerosis of aorta: Secondary | ICD-10-CM

## 2023-04-04 DIAGNOSIS — I10 Essential (primary) hypertension: Secondary | ICD-10-CM

## 2023-04-04 DIAGNOSIS — E1165 Type 2 diabetes mellitus with hyperglycemia: Secondary | ICD-10-CM

## 2023-04-07 ENCOUNTER — Other Ambulatory Visit: Payer: Medicare HMO

## 2023-04-16 ENCOUNTER — Other Ambulatory Visit: Payer: Medicare HMO

## 2023-04-17 ENCOUNTER — Other Ambulatory Visit: Payer: Medicare HMO

## 2023-04-17 DIAGNOSIS — E1165 Type 2 diabetes mellitus with hyperglycemia: Secondary | ICD-10-CM | POA: Diagnosis not present

## 2023-04-18 LAB — MICROALBUMIN / CREATININE URINE RATIO
Creatinine, Urine: 25.9 mg/dL
Microalb/Creat Ratio: <12 mg/g{creat} (ref 0–29)
Microalbumin, Urine: <3 ug/mL

## 2023-04-23 ENCOUNTER — Ambulatory Visit (INDEPENDENT_AMBULATORY_CARE_PROVIDER_SITE_OTHER): Payer: Medicare HMO

## 2023-04-23 DIAGNOSIS — Z23 Encounter for immunization: Secondary | ICD-10-CM | POA: Diagnosis not present

## 2023-05-05 DIAGNOSIS — H25813 Combined forms of age-related cataract, bilateral: Secondary | ICD-10-CM | POA: Diagnosis not present

## 2023-05-05 DIAGNOSIS — H52223 Regular astigmatism, bilateral: Secondary | ICD-10-CM | POA: Diagnosis not present

## 2023-05-05 DIAGNOSIS — H524 Presbyopia: Secondary | ICD-10-CM | POA: Diagnosis not present

## 2023-05-05 DIAGNOSIS — E119 Type 2 diabetes mellitus without complications: Secondary | ICD-10-CM | POA: Diagnosis not present

## 2023-05-05 DIAGNOSIS — H5203 Hypermetropia, bilateral: Secondary | ICD-10-CM | POA: Diagnosis not present

## 2023-05-05 LAB — HM DIABETES EYE EXAM

## 2023-05-26 ENCOUNTER — Ambulatory Visit (INDEPENDENT_AMBULATORY_CARE_PROVIDER_SITE_OTHER): Payer: Medicare HMO | Admitting: Family Medicine

## 2023-05-26 ENCOUNTER — Encounter: Payer: Self-pay | Admitting: Family Medicine

## 2023-05-26 VITALS — BP 129/72 | HR 80 | Temp 98.4°F | Ht 63.0 in | Wt 145.5 lb

## 2023-05-26 DIAGNOSIS — I700 Atherosclerosis of aorta: Secondary | ICD-10-CM

## 2023-05-26 DIAGNOSIS — R1319 Other dysphagia: Secondary | ICD-10-CM

## 2023-05-26 DIAGNOSIS — R21 Rash and other nonspecific skin eruption: Secondary | ICD-10-CM | POA: Diagnosis not present

## 2023-05-26 DIAGNOSIS — E1165 Type 2 diabetes mellitus with hyperglycemia: Secondary | ICD-10-CM | POA: Diagnosis not present

## 2023-05-26 DIAGNOSIS — Z7984 Long term (current) use of oral hypoglycemic drugs: Secondary | ICD-10-CM

## 2023-05-26 DIAGNOSIS — K219 Gastro-esophageal reflux disease without esophagitis: Secondary | ICD-10-CM

## 2023-05-26 DIAGNOSIS — I152 Hypertension secondary to endocrine disorders: Secondary | ICD-10-CM

## 2023-05-26 DIAGNOSIS — E1169 Type 2 diabetes mellitus with other specified complication: Secondary | ICD-10-CM | POA: Insufficient documentation

## 2023-05-26 DIAGNOSIS — M5030 Other cervical disc degeneration, unspecified cervical region: Secondary | ICD-10-CM

## 2023-05-26 DIAGNOSIS — E1159 Type 2 diabetes mellitus with other circulatory complications: Secondary | ICD-10-CM

## 2023-05-26 DIAGNOSIS — E785 Hyperlipidemia, unspecified: Secondary | ICD-10-CM

## 2023-05-26 LAB — BAYER DCA HB A1C WAIVED: HB A1C (BAYER DCA - WAIVED): 5.4 % (ref 4.8–5.6)

## 2023-05-26 MED ORDER — TRIAMCINOLONE ACETONIDE 0.1 % EX CREA: 1.0000 | TOPICAL_CREAM | Freq: Two times a day (BID) | CUTANEOUS | 0 refills | Status: AC

## 2023-05-26 NOTE — Progress Notes (Signed)
Established Patient Office Visit  Subjective   Patient ID: Cassie Hunter, female    DOB: [birth year]  Age: 67 y.o. MRN: [id number]  Chief Complaint  Patient presents with   Medical Management of Chronic Issues   Diabetes    HPI  T2DM Pt presents for follow up evaluation of Type 2 diabetes mellitus.  Current symptoms include paresthesia of the feet. Patient denies foot ulcerations, increased appetite, polydipsia, polyuria, visual disturbances, vomiting, and weight loss.  Current diabetic medications include: metformin Compliant with meds - Yes  Current monitoring regimen: home blood tests - daily Home blood sugar records: fasting range: 100-110 postprandial <150 Any episodes of hypoglycemia? no  2. HTN Complaint with meds - Yes Current Medications - losartan Pertinent ROS:  Visual Disturbances - No Chest pain - No Dyspnea - No Palpitations - No LE edema - No  3. GERD/dysphagia Has seen specialist in [facility]. She will need 3 different procedures at [facility] hospital to have her esophagus stretching because her strictures are so tight. She has not had to done due to time constraints and transportation issues. She will call ths week to schedule as she is having more difficulty swallowing. Denies abdominal pain, nausea, or vomiting.   4. Cervical DDD She is having daily neck pain that is now radiating up to her head and cause significant headaches. Pain worsens with activity and bending or rotating neck. Some tingling and decreased sensation in fingertips. She has completed PT in the past. She has tried tylenol, heat without improvement. Has appt with ortho next week. Had CT scan in May of this year.   5. Rash Dry, rough patch on right lower leg for 2 months. A little tender. No itchy. No exudate.   Past Medical History:  Diagnosis Date   Diabetes mellitus    Difficulty swallowing    Diverticulitis    Essential hypertension    GERD (gastroesophageal reflux disease)     History of esophageal stricture       ROS As per HPI.    Objective:     BP 129/72   Pulse 80   Temp 98.4 F (36.9 C) (Temporal)   Ht 5\' 3"  (1.6 m)   Wt 145 lb 8 oz (66 kg)   SpO2 99%   BMI 25.77 kg/m  Wt Readings from Last 3 Encounters:  05/26/23 145 lb 8 oz (66 kg)  02/21/23 149 lb 2 oz (67.6 kg)  12/17/22 146 lb (66.2 kg)      Physical Exam Vitals and nursing note reviewed.  Constitutional:      General: She is not in acute distress.    Appearance: Normal appearance. She is not ill-appearing, toxic-appearing or diaphoretic.  Cardiovascular:     Rate and Rhythm: Normal rate and regular rhythm.     Heart sounds: Normal heart sounds. No murmur heard. Pulmonary:     Effort: Pulmonary effort is normal. No respiratory distress.     Breath sounds: Normal breath sounds. No wheezing or rhonchi.  Abdominal:     General: Bowel sounds are normal. There is no distension.     Palpations: Abdomen is soft.     Tenderness: There is no abdominal tenderness. There is no guarding or rebound.  Musculoskeletal:     Cervical back: Neck supple. No rigidity.     Right lower leg: No edema.     Left lower leg: No edema.  Lymphadenopathy:     Cervical: No cervical adenopathy.  Skin:  General: Skin is warm and dry.     Findings: Rash (dry, scaling patch on right lateral lower leg. Pictured below) present.  Neurological:     General: No focal deficit present.     Mental Status: She is alert and oriented to person, place, and time.     Motor: No weakness.     Gait: Gait normal.  Psychiatric:        Mood and Affect: Mood normal.        Behavior: Behavior normal.      No results found for any visits on 05/26/23.    The ASCVD Risk score (Arnett DK, et al., 2019) failed to calculate for the following reasons:   The valid HDL cholesterol range is 20 to 100 mg/dL    Assessment & Plan:   Cassie Hunter was seen today for medical management of chronic issues and diabetes.  Diagnoses and  all orders for this visit:  Type 2 diabetes mellitus with hyperglycemia, without long-term current use of insulin (HCC) A1c 5.4 today, at goal of <7. Medication changes today: none. Continue metformin. She is on an ACE/ARB and statin. Eye exam: UTD. Foot exam: UTD. Urine micro: UTD. Diet and exercise.  -     CMP14+EGFR -     Bayer DCA Hb A1c Waived  Hypertension associated with diabetes (HCC) Well controlled on current regimen.   Dyslipidemia associated with type 2 diabetes mellitus (HCC) Last LDL at 58. On statin.   Aortic atherosclerosis (HCC) On statin and aspirin.   Gastroesophageal reflux disease, unspecified whether esophagitis present Continue PPI. Established with GI.   Esophageal dysphagia Schedule procedure with [facility] specialist.   DDD (degenerative disc disease), cervical Keep appt with ortho for further evaluation.   Rash Try kenalog BID as below. Return to office for new or worsening symptoms, or if symptoms persist.  -     triamcinolone cream (KENALOG) 0.1 %; Apply 1 Application topically 2 (two) times daily for 10 days.  Return in about 3 months (around 08/26/2023) for CPE.   The patient indicates understanding of these issues and agrees with the plan.  Cassie Earing, FNP

## 2023-05-27 LAB — CMP14+EGFR
ALT: 9 [IU]/L (ref 0–32)
AST: 14 IU/L (ref 0–40)
Albumin: 4 g/dL (ref 3.9–4.9)
Alkaline Phosphatase: 82 IU/L (ref 44–121)
BUN/Creatinine Ratio: 9 — ABNORMAL LOW (ref 12–28)
BUN: 8 mg/dL (ref 8–27)
Bilirubin Total: 0.5 mg/dL (ref 0.0–1.2)
CO2: 22 mmol/L (ref 20–29)
Calcium: 8.7 mg/dL (ref 8.7–10.3)
Chloride: 102 mmol/L (ref 96–106)
Creatinine, Ser: 0.94 mg/dL (ref 0.57–1.00)
Globulin, Total: 2.1 g/dL (ref 1.5–4.5)
Glucose: 106 mg/dL — ABNORMAL HIGH (ref 70–99)
Potassium: 4.8 mmol/L (ref 3.5–5.2)
Sodium: 139 mmol/L (ref 134–144)
Total Protein: 6.1 g/dL (ref 6.0–8.5)
eGFR: 66 mL/min/{1.73_m2} (ref >59)

## 2023-06-09 ENCOUNTER — Other Ambulatory Visit: Payer: Self-pay | Admitting: Family Medicine

## 2023-06-09 DIAGNOSIS — R1319 Other dysphagia: Secondary | ICD-10-CM

## 2023-06-16 ENCOUNTER — Other Ambulatory Visit: Payer: Self-pay | Admitting: Family Medicine

## 2023-06-16 DIAGNOSIS — I700 Atherosclerosis of aorta: Secondary | ICD-10-CM

## 2023-06-16 DIAGNOSIS — E1165 Type 2 diabetes mellitus with hyperglycemia: Secondary | ICD-10-CM

## 2023-06-27 ENCOUNTER — Other Ambulatory Visit: Payer: Self-pay | Admitting: Family Medicine

## 2023-06-27 DIAGNOSIS — E1165 Type 2 diabetes mellitus with hyperglycemia: Secondary | ICD-10-CM

## 2023-06-27 DIAGNOSIS — I10 Essential (primary) hypertension: Secondary | ICD-10-CM

## 2023-07-07 NOTE — Telephone Encounter (Signed)
 Results abstracted and care team is up to date.

## 2023-08-07 ENCOUNTER — Other Ambulatory Visit: Payer: Self-pay | Admitting: Family Medicine

## 2023-08-07 DIAGNOSIS — E1165 Type 2 diabetes mellitus with hyperglycemia: Secondary | ICD-10-CM

## 2023-09-02 ENCOUNTER — Other Ambulatory Visit: Payer: Self-pay | Admitting: Family Medicine

## 2023-09-02 DIAGNOSIS — R1319 Other dysphagia: Secondary | ICD-10-CM

## 2023-09-03 ENCOUNTER — Encounter: Payer: Self-pay | Admitting: Family Medicine

## 2023-09-04 ENCOUNTER — Encounter: Payer: Medicare HMO | Admitting: Family Medicine

## 2023-09-08 ENCOUNTER — Other Ambulatory Visit: Payer: Self-pay | Admitting: Family Medicine

## 2023-09-08 DIAGNOSIS — E1165 Type 2 diabetes mellitus with hyperglycemia: Secondary | ICD-10-CM

## 2023-09-08 DIAGNOSIS — I10 Essential (primary) hypertension: Secondary | ICD-10-CM

## 2023-09-18 ENCOUNTER — Encounter: Payer: Self-pay | Admitting: Family Medicine

## 2023-09-18 ENCOUNTER — Ambulatory Visit: Payer: Medicare HMO | Admitting: Family Medicine

## 2023-09-18 VITALS — BP 138/66 | HR 78 | Temp 97.7°F | Ht 63.0 in | Wt 145.2 lb

## 2023-09-18 DIAGNOSIS — R1319 Other dysphagia: Secondary | ICD-10-CM

## 2023-09-18 DIAGNOSIS — Z23 Encounter for immunization: Secondary | ICD-10-CM

## 2023-09-18 DIAGNOSIS — I471 Supraventricular tachycardia, unspecified: Secondary | ICD-10-CM

## 2023-09-18 DIAGNOSIS — E1159 Type 2 diabetes mellitus with other circulatory complications: Secondary | ICD-10-CM | POA: Diagnosis not present

## 2023-09-18 DIAGNOSIS — K219 Gastro-esophageal reflux disease without esophagitis: Secondary | ICD-10-CM

## 2023-09-18 DIAGNOSIS — I152 Hypertension secondary to endocrine disorders: Secondary | ICD-10-CM

## 2023-09-18 DIAGNOSIS — E1165 Type 2 diabetes mellitus with hyperglycemia: Secondary | ICD-10-CM

## 2023-09-18 DIAGNOSIS — Z7984 Long term (current) use of oral hypoglycemic drugs: Secondary | ICD-10-CM

## 2023-09-18 DIAGNOSIS — I480 Paroxysmal atrial fibrillation: Secondary | ICD-10-CM | POA: Insufficient documentation

## 2023-09-18 DIAGNOSIS — E1169 Type 2 diabetes mellitus with other specified complication: Secondary | ICD-10-CM

## 2023-09-18 DIAGNOSIS — I700 Atherosclerosis of aorta: Secondary | ICD-10-CM

## 2023-09-18 DIAGNOSIS — E785 Hyperlipidemia, unspecified: Secondary | ICD-10-CM | POA: Diagnosis not present

## 2023-09-18 LAB — BAYER DCA HB A1C WAIVED: HB A1C (BAYER DCA - WAIVED): 5.1 % (ref 4.8–5.6)

## 2023-09-18 NOTE — Progress Notes (Signed)
 Established Patient Office Visit  Subjective   Patient ID: Cassie Hunter, female    DOB: 22-Nov-1955  Age: 68 y.o. MRN: [id number]  Chief Complaint  Patient presents with   Medical Management of Chronic Issues    HPI  T2DM Pt presents for follow up evaluation of Type 2 diabetes mellitus.  Current symptoms include paresthesia of the feet. Patient denies foot ulcerations, increased appetite, polydipsia, polyuria, visual disturbances, vomiting, and weight loss.  Current diabetic medications include: metformin Compliant with meds - Yes  Current monitoring regimen: occasionally  2. HTN Complaint with meds - Yes Current Medications - losartan Pertinent ROS:  Visual Disturbances - No Chest pain - No Dyspnea - No Palpitations - Occasionally LE edema - No  Has appt with cardiology tomorrow.   3. GERD/dysphagia Has seen specialist in [facility]. She will need 3 different procedures at [facility] to have her esophagus stretching because her strictures are so tight. Still hasn't been able to get to appts yet due to transportation constraints.   Past Medical History:  Diagnosis Date   Diabetes mellitus    Difficulty swallowing    Diverticulitis    Essential hypertension    GERD (gastroesophageal reflux disease)    History of esophageal stricture       ROS As per HPI.    Objective:     BP 138/66   Pulse 78   Temp 97.7 F (36.5 C) (Temporal)   Ht 5\' 3"  (1.6 m)   Wt 145 lb 3.2 oz (65.9 kg)   SpO2 100%   BMI 25.72 kg/m  Wt Readings from Last 3 Encounters:  09/18/23 145 lb 3.2 oz (65.9 kg)  05/26/23 145 lb 8 oz (66 kg)  02/21/23 149 lb 2 oz (67.6 kg)      Physical Exam Vitals and nursing note reviewed.  Constitutional:      General: She is not in acute distress.    Appearance: Normal appearance. She is not ill-appearing, toxic-appearing or diaphoretic.  Cardiovascular:     Rate and Rhythm: Normal rate and regular rhythm.     Heart sounds: Normal heart  sounds. No murmur heard. Pulmonary:     Effort: Pulmonary effort is normal. No respiratory distress.     Breath sounds: Normal breath sounds. No wheezing or rhonchi.  Abdominal:     General: Bowel sounds are normal. There is no distension.     Palpations: Abdomen is soft.     Tenderness: There is no abdominal tenderness. There is no guarding or rebound.  Musculoskeletal:     Cervical back: Neck supple. No rigidity.     Right lower leg: No edema.     Left lower leg: No edema.  Lymphadenopathy:     Cervical: No cervical adenopathy.  Skin:    General: Skin is warm and dry.  Neurological:     General: No focal deficit present.     Mental Status: She is alert and oriented to person, place, and time.     Motor: No weakness.     Gait: Gait normal.  Psychiatric:        Mood and Affect: Mood normal.        Behavior: Behavior normal.     No results found for any visits on 09/18/23.   The ASCVD Risk score (Arnett DK, et al., 2019) failed to calculate for the following reasons:   The valid HDL cholesterol range is 20 to 100 mg/dL    Assessment & Plan:  Mercy was seen today for medical management of chronic issues.  Diagnoses and all orders for this visit:  Type 2 diabetes mellitus with hyperglycemia, without long-term current use of insulin (HCC) A1c 5.1 today, at goal of <7. Continue metformin. She is on an ACE/ARB and statin. -     Bayer DCA Hb A1c Waived  Dyslipidemia associated with type 2 diabetes mellitus (HCC) On statin. Last LDL 58.  Hypertension associated with diabetes (HCC) Well controlled on current regimen. Lifestyle management.   Supraventricular tachycardia (HCC) Has follow up with cardiology tomorrow. RRR on exam today.   Aortic atherosclerosis (HCC) On statin and aspirin.   Gastroesophageal reflux disease, unspecified whether esophagitis present Esophageal dysphagia She will contact her GI to see if there are any closer options for her.   Need for  vaccination against Streptococcus pneumoniae using pneumococcal conjugate vaccine 13 -     Pneumococcal conjugate vaccine 20-valent (Prevnar 20)   Return in about 3 months (around 12/19/2023) for chronic follow up, 6 months CPE.   The patient indicates understanding of these issues and agrees with the plan.  Gabriel Earing, FNP

## 2023-09-19 ENCOUNTER — Encounter: Payer: Self-pay | Admitting: Cardiovascular Disease

## 2023-09-19 ENCOUNTER — Ambulatory Visit: Payer: Medicare HMO | Attending: Cardiovascular Disease | Admitting: Cardiovascular Disease

## 2023-09-19 VITALS — BP 138/58 | HR 96 | Ht 63.0 in | Wt 146.0 lb

## 2023-09-19 DIAGNOSIS — I471 Supraventricular tachycardia, unspecified: Secondary | ICD-10-CM | POA: Diagnosis not present

## 2023-09-19 NOTE — Patient Instructions (Signed)
 Medication Instructions:  Your physician recommends that you continue on your current medications as directed. Please refer to the Current Medication list given to you today.  *If you need a refill on your cardiac medications before your next appointment, please call your pharmacy*  Lab Work: None ordered today. If you have labs (blood work) drawn today and your tests are completely normal, you will receive your results only by: MyChart Message (if you have MyChart) OR A paper copy in the mail If you have any lab test that is abnormal or we need to change your treatment, we will call you to review the results.  Testing/Procedures: None ordered today.  Follow-Up: At [facility], you and your health needs are our priority.  As part of our continuing mission to provide you with exceptional heart care, we have created designated Provider Care Teams.  These Care Teams include your primary Cardiologist (physician) and Advanced Practice Providers (APPs -  Physician Assistants and Nurse Practitioners) who all work together to provide you with the care you need, when you need it.  We recommend signing up for the patient portal called "MyChart".  Sign up information is provided on this After Visit Summary.  MyChart is used to connect with patients for Virtual Visits (Telemedicine).  Patients are able to view lab/test results, encounter notes, upcoming appointments, etc.  Non-urgent messages can be sent to your provider as well.   To learn more about what you can do with MyChart, go to [email or web address].    Your next appointment:   1 year(s)  The format for your next appointment:   In Person  Provider:   Verne Carrow, MD{

## 2023-09-19 NOTE — Progress Notes (Signed)
 Chief Complaint  Patient presents with   Follow-up    SVT   History of Present Illness: 68 yo female with history of SVT, DM, hyperlipidemia, esophageal stricture and HTN who is here today for cardiac follow up. She was seen as a new patient for the evaluation of palpitations in August 2022. She has a history of esophageal stricture. She had a choking episode on May 2022. After this, she felt her heart racing and chest pain. EMS told her that her heart was racing and that she had SVT. She tried vagal maneuvers and then had IV adenosine and IV cardizem administered per EMS and converted to sinus. Echo September 2022 with LVEF=60-65% and no valve disease. She called our office in January 2023 and reported palpitations. Cardiac monitor February 2023 with 6 runs of SVT, longest 8 beats. She has been on Cardizem.    She is here today for follow up. The patient denies any chest pain, dyspnea, palpitations, lower extremity edema, orthopnea, PND, dizziness, near syncope or syncope. Rare palpitations that occur for a few seconds every 3-4 months.   Primary Care Physician: Gabriel Earing, FNP  Past Medical History:  Diagnosis Date   Diabetes mellitus    Difficulty swallowing    Diverticulitis    Essential hypertension    GERD (gastroesophageal reflux disease)    History of esophageal stricture     Past Surgical History:  Procedure Laterality Date   ABDOMINAL HYSTERECTOMY     APPENDECTOMY     CESAREAN SECTION  02/26/1988-07/01/1990   CHOLECYSTECTOMY     SPINAL CORD DECOMPRESSION  1996   TUBAL LIGATION  07/01/1990    Current Outpatient Medications  Medication Sig Dispense Refill   Alcohol Swabs (B-D SINGLE USE SWABS REGULAR) PADS Test BS daily and as needed Dx E11.9 100 each 3   aspirin 81 MG EC tablet Take 1 tablet (81 mg total) by mouth daily. Swallow whole. 30 tablet 12   Blood Glucose Calibration (TRUE METRIX LEVEL 1) Low SOLN Use with glucose monitor Dx E11.9 3 each 3   Blood  Glucose Monitoring Suppl (TRUE METRIX AIR GLUCOSE METER) w/Device KIT Test BS daily and as needed Dx E11.9 1 kit 0   diltiazem (CARDIZEM CD) 240 MG 24 hr capsule Take 1 capsule (240 mg total) by mouth daily. 90 capsule 3   diltiazem (CARDIZEM) 30 MG tablet Take 1 tablet (30 mg total) by mouth 4 (four) times daily as needed. 60 tablet 1   glucose blood (TRUE METRIX BLOOD GLUCOSE TEST) test strip Test BS daily and as needed Dx E11.9 100 each 3   losartan (COZAAR) 100 MG tablet TAKE 1 TABLET EVERY DAY 90 tablet 0   metFORMIN (GLUCOPHAGE) 500 MG tablet TAKE 2 TABLETS DAILY WITH BREAKFAST AND 1 TABLET DAILY WITH SUPPER. 270 tablet 0   omeprazole (PRILOSEC) 40 MG capsule TAKE 1 CAPSULE BY MOUTH TWICE DAILY BEFORE A MEAL 180 capsule 0   simvastatin (ZOCOR) 20 MG tablet TAKE 1 TABLET EVERY DAY AT 6:00PM 90 tablet 1   TRUEplus Lancets 33G MISC Test BS daily and as needed Dx E11.9 100 each 3   albuterol (VENTOLIN HFA) 108 (90 Base) MCG/ACT inhaler Inhale 2 puffs into the lungs every 6 (six) hours as needed. (Patient not taking: Reported on 09/19/2023) 18 g 2   No current facility-administered medications for this visit.    Allergies  Allergen Reactions   Codeine Nausea Only and Other (See Comments)   Neosporin [Bacitracin-Polymyxin B]  Rash    Social History   Socioeconomic History   Marital status: Married    Spouse name: Peyton Najjar   Number of children: 2   Years of education: Not on file   Highest education level: Some college, no degree  Occupational History   Occupation: Retired Geophysicist/field seismologist  Tobacco Use   Smoking status: Never   Smokeless tobacco: Never  Vaping Use   Vaping status: Never Used  Substance and Sexual Activity   Alcohol use: No   Drug use: No   Sexual activity: Not Currently    Birth control/protection: Surgical  Other Topics Concern   Not on file  Social History Narrative   Lives with her husband in one level home   Daughter lives 2 blocks away   Social Drivers  of Health   Financial Resource Strain: Low Risk  (09/15/2023)   Overall Financial Resource Strain (CARDIA)    Difficulty of Paying Living Expenses: Not very hard  Food Insecurity: No Food Insecurity (09/15/2023)   Hunger Vital Sign    Worried About Running Out of Food in the Last Year: Never true    Ran Out of Food in the Last Year: Never true  Transportation Needs: No Transportation Needs (09/15/2023)   PRAPARE - Administrator, Civil Service (Medical): No    Lack of Transportation (Non-Medical): No  Physical Activity: Insufficiently Active (09/15/2023)   Exercise Vital Sign    Days of Exercise per Week: 2 days    Minutes of Exercise per Session: 20 min  Stress: No Stress Concern Present (09/15/2023)   [employer] of Occupational Health - Occupational Stress Questionnaire    Feeling of Stress : Not at all  Social Connections: Unknown (09/15/2023)   Social Connection and Isolation Panel [NHANES]    Frequency of Communication with Friends and Family: More than three times a week    Frequency of Social Gatherings with Friends and Family: More than three times a week    Attends Religious Services: Patient declined    Database administrator or Organizations: No    Attends Banker Meetings: Never    Marital Status: Married  Catering manager Violence: Not At Risk (12/17/2022)   Humiliation, Afraid, Rape, and Kick questionnaire    Fear of Current or Ex-Partner: No    Emotionally Abused: No    Physically Abused: No    Sexually Abused: No    Family History  Problem Relation Age of Onset   CVA Mother    Stroke Mother    Pancreatic cancer Father    Lung cancer Father    Cancer Father    Spina bifida Sister    Heart attack Maternal Grandmother    Heart attack Maternal Grandfather    Stroke Paternal Grandmother    Aneurysm Paternal Grandfather    ADD / ADHD Son    Asthma Son    Breast cancer Neg Hx     Review of Systems:  As stated in the HPI and otherwise  negative.   BP (!) 138/58   Pulse 96   Ht 5\' 3"  (1.6 m)   Wt 66.2 kg   SpO2 97%   BMI 25.86 kg/m   Physical Examination: General: Well developed, well nourished, NAD  HEENT: OP clear, mucus membranes moist  SKIN: warm, dry. No rashes. Neuro: No focal deficits  Musculoskeletal: Muscle strength 5/5 all ext  Psychiatric: Mood and affect normal  Neck: No JVD, no carotid bruits, no thyromegaly, no  lymphadenopathy.  Lungs:Clear bilaterally, no wheezes, rhonci, crackles Cardiovascular: Regular rate and rhythm. No murmurs, gallops or rubs. Abdomen:Soft. Bowel sounds present. Non-tender.  Extremities: No lower extremity edema. Pulses are 2 + in the bilateral DP/PT.  EKG:  EKG is not ordered today. The EKG is personally reviewed and shows    Recent Labs: 02/21/2023: Hemoglobin 11.5; Platelets 357; TSH 0.635 05/26/2023: ALT 9; BUN 8; Creatinine, Ser 0.94; Potassium 4.8; Sodium 139   Lipid Panel    Component Value Date/Time   CHOL 179 02/21/2023 0808   TRIG 99 02/21/2023 0808   HDL 104 02/21/2023 0808   CHOLHDL 1.7 02/21/2023 0808   LDLCALC 58 02/21/2023 0808     Wt Readings from Last 3 Encounters:  09/19/23 66.2 kg  09/18/23 65.9 kg  05/26/23 66 kg    Assessment and Plan:   1. SVT: Rare palpitations. Continue Cardizem.   Labs/ tests ordered today include:   No orders of the defined types were placed in this encounter.  Disposition:   F/U me in 12 months.   Signed, Verne Carrow, MD 09/19/2023 3:48 PM    [facility] [address] [place], [place], [place]  [address] Phone: [phone]; Fax: [phone]

## 2023-09-27 DIAGNOSIS — R220 Localized swelling, mass and lump, head: Secondary | ICD-10-CM | POA: Diagnosis not present

## 2023-09-27 DIAGNOSIS — K047 Periapical abscess without sinus: Secondary | ICD-10-CM | POA: Diagnosis not present

## 2023-09-29 ENCOUNTER — Encounter: Payer: Self-pay | Admitting: Family Medicine

## 2023-10-07 ENCOUNTER — Other Ambulatory Visit: Payer: Self-pay | Admitting: Cardiovascular Disease

## 2023-10-07 ENCOUNTER — Other Ambulatory Visit: Payer: Self-pay | Admitting: Family Medicine

## 2023-10-09 ENCOUNTER — Encounter: Payer: Self-pay | Admitting: Cardiovascular Disease

## 2023-10-09 MED ORDER — DILTIAZEM HCL ER COATED BEADS 240 MG PO CP24: 240.0000 mg | ORAL_CAPSULE | Freq: Every day | ORAL | 3 refills | Status: AC

## 2023-10-09 NOTE — Telephone Encounter (Signed)
 Per OV note on 09/19/23 by McALhany:  1. SVT: Rare palpitations. Continue Cardizem.    Labs/ tests ordered today include:    No orders of the defined types were placed in this encounter.   Disposition:   F/U me in 12 months.   Medication sent to pharmacy as requested.

## 2023-10-19 ENCOUNTER — Other Ambulatory Visit: Payer: Self-pay | Admitting: Family Medicine

## 2023-10-19 DIAGNOSIS — E1165 Type 2 diabetes mellitus with hyperglycemia: Secondary | ICD-10-CM

## 2023-11-10 ENCOUNTER — Other Ambulatory Visit: Payer: Self-pay | Admitting: Family Medicine

## 2023-11-10 DIAGNOSIS — I700 Atherosclerosis of aorta: Secondary | ICD-10-CM

## 2023-11-10 DIAGNOSIS — E1165 Type 2 diabetes mellitus with hyperglycemia: Secondary | ICD-10-CM

## 2023-11-11 ENCOUNTER — Encounter: Payer: Self-pay | Admitting: Cardiovascular Disease

## 2023-11-21 ENCOUNTER — Other Ambulatory Visit: Payer: Self-pay | Admitting: Family Medicine

## 2023-11-21 DIAGNOSIS — I10 Essential (primary) hypertension: Secondary | ICD-10-CM

## 2023-11-21 DIAGNOSIS — E1165 Type 2 diabetes mellitus with hyperglycemia: Secondary | ICD-10-CM

## 2023-11-29 ENCOUNTER — Other Ambulatory Visit: Payer: Self-pay | Admitting: Family Medicine

## 2023-11-29 DIAGNOSIS — R1319 Other dysphagia: Secondary | ICD-10-CM

## 2023-12-18 ENCOUNTER — Ambulatory Visit: Payer: Medicare HMO

## 2023-12-18 VITALS — BP 138/58 | HR 96 | Ht 63.0 in | Wt 146.0 lb

## 2023-12-18 DIAGNOSIS — Z0000 Encounter for general adult medical examination without abnormal findings: Secondary | ICD-10-CM | POA: Diagnosis not present

## 2023-12-18 NOTE — Progress Notes (Signed)
 Subjective:   Cassie Hunter is a 68 y.o. who presents for a Medicare Wellness preventive visit.  As a reminder, Annual Wellness Visits don't include a physical exam, and some assessments may be limited, especially if this visit is performed virtually. We may recommend an in-person follow-up visit with your provider if needed.  Visit Complete: Virtual I connected with  Cassie Hunter on 12/18/23 by a audio enabled telemedicine application and verified that I am speaking with the correct person using two identifiers.  Patient Location: Home  Provider Location: Home Office  I discussed the limitations of evaluation and management by telemedicine. The patient expressed understanding and agreed to proceed.  Vital Signs: Because this visit was a virtual/telehealth visit, some criteria may be missing or patient reported. Any vitals not documented were not able to be obtained and vitals that have been documented are patient reported.  VideoDeclined- This patient declined Librarian, academic. Therefore the visit was completed with audio only.  Persons Participating in Visit: Patient.  AWV Questionnaire: Yes: Patient Medicare AWV questionnaire was completed by the patient on 12/14/23; I have confirmed that all information answered by patient is correct and no changes since this date.  Cardiac Risk Factors include: advanced age (>82men, >26 women);diabetes mellitus;hypertension     Objective:     Today's Vitals   12/18/23 0817  BP: (!) 138/58  Pulse: 96  Weight: 146 lb (66.2 kg)  Height: 5\' 3"  (1.6 m)   Body mass index is 25.86 kg/m.     12/18/2023    8:06 AM 12/17/2022    8:09 AM 12/03/2022    4:01 AM 12/13/2021    9:17 AM 10/25/2021    3:15 PM 08/16/2021    5:36 PM 12/08/2020    8:30 AM  Advanced Directives  Does Patient Have a Medical Advance Directive? No No No No No No No  Would patient like information on creating a medical advance  directive?  No - Patient declined  No - Patient declined  No - Patient declined No - Patient declined    Current Medications (verified) Outpatient Encounter Medications as of 12/18/2023  Medication Sig   albuterol  (VENTOLIN  HFA) 108 (90 Base) MCG/ACT inhaler Inhale 2 puffs into the lungs every 6 (six) hours as needed. (Patient not taking: Reported on 09/19/2023)   Alcohol Swabs (DROPSAFE ALCOHOL PREP) 70 % PADS Test BS daily and as needed Dx E11.9   aspirin  81 MG EC tablet Take 1 tablet (81 mg total) by mouth daily. Swallow whole.   Blood Glucose Calibration (TRUE METRIX LEVEL 1) Low SOLN Use with glucose monitor Dx E11.9   Blood Glucose Monitoring Suppl (TRUE METRIX AIR GLUCOSE METER) w/Device KIT Test BS daily and as needed Dx E11.9   diltiazem  (CARDIZEM  CD) 240 MG 24 hr capsule Take 1 capsule (240 mg total) by mouth daily.   diltiazem  (CARDIZEM ) 30 MG tablet Take 1 tablet (30 mg total) by mouth 4 (four) times daily as needed.   glucose blood (TRUE METRIX BLOOD GLUCOSE TEST) test strip Test BS daily and as needed Dx E11.9   losartan  (COZAAR ) 100 MG tablet TAKE 1 TABLET EVERY DAY   metFORMIN  (GLUCOPHAGE ) 500 MG tablet TAKE 2 TABLETS DAILY WITH BREAKFAST AND 1 TABLET DAILY WITH SUPPER.   omeprazole  (PRILOSEC) 40 MG capsule TAKE 1 CAPSULE BY MOUTH TWICE DAILY BEFORE A MEAL   simvastatin  (ZOCOR ) 20 MG tablet TAKE 1 TABLET EVERY DAY AT 6:00PM   TRUEplus Lancets 33G  MISC Test BS daily and as needed Dx E11.9   No facility-administered encounter medications on file as of 12/18/2023.    Allergies (verified) Codeine and Neosporin [bacitracin-polymyxin b]   History: Past Medical History:  Diagnosis Date   Diabetes mellitus    Difficulty swallowing    Diverticulitis    Essential hypertension    GERD (gastroesophageal reflux disease)    History of esophageal stricture    Past Surgical History:  Procedure Laterality Date   ABDOMINAL HYSTERECTOMY     APPENDECTOMY     CESAREAN SECTION   02/26/1988-07/01/1990   CHOLECYSTECTOMY     SPINAL CORD DECOMPRESSION  1996   TUBAL LIGATION  07/01/1990   Family History  Problem Relation Age of Onset   CVA Mother    Stroke Mother    Pancreatic cancer Father    Lung cancer Father    Cancer Father    Spina bifida Sister    Heart attack Maternal Grandmother    Heart attack Maternal Grandfather    Stroke Paternal Grandmother    Aneurysm Paternal Grandfather    ADD / ADHD Son    Asthma Son    Birth defects Sister    Breast cancer Neg Hx    Social History   Socioeconomic History   Marital status: Married    Spouse name: Hewitt Lou   Number of children: 2   Years of education: Not on file   Highest education level: Some college, no degree  Occupational History   Occupation: Retired Geophysicist/field seismologist  Tobacco Use   Smoking status: Never   Smokeless tobacco: Never  Vaping Use   Vaping status: Never Used  Substance and Sexual Activity   Alcohol use: No   Drug use: No   Sexual activity: Not Currently    Birth control/protection: Surgical  Other Topics Concern   Not on file  Social History Narrative   Lives with her husband in one level home   Daughter lives 2 blocks away   Social Drivers of Health   Financial Resource Strain: Low Risk  (12/18/2023)   Overall Financial Resource Strain (CARDIA)    Difficulty of Paying Living Expenses: Not hard at all  Food Insecurity: No Food Insecurity (12/18/2023)   Hunger Vital Sign    Worried About Running Out of Food in the Last Year: Never true    Ran Out of Food in the Last Year: Never true  Transportation Needs: No Transportation Needs (12/18/2023)   PRAPARE - Administrator, Civil Service (Medical): No    Lack of Transportation (Non-Medical): No  Physical Activity: Insufficiently Active (12/18/2023)   Exercise Vital Sign    Days of Exercise per Week: 7 days    Minutes of Exercise per Session: 20 min  Stress: No Stress Concern Present (12/18/2023)   [employer] of  Occupational Health - Occupational Stress Questionnaire    Feeling of Stress : Not at all  Social Connections: Moderately Integrated (12/18/2023)   Social Connection and Isolation Panel [NHANES]    Frequency of Communication with Friends and Family: More than three times a week    Frequency of Social Gatherings with Friends and Family: More than three times a week    Attends Religious Services: Never    Database administrator or Organizations: Yes    Attends Banker Meetings: Never    Marital Status: Married    Tobacco Counseling Counseling given: Yes    Clinical Intake:  Pre-visit preparation completed: Yes  Pain : No/denies pain     BMI - recorded: 25.86 Nutritional Status: BMI of 19-24  Normal Nutritional Risks: None Diabetes: Yes CBG done?: No  Lab Results  Component Value Date   HGBA1C 5.1 09/18/2023   HGBA1C 5.4 05/26/2023   HGBA1C 5.7 (H) 02/21/2023     How often do you need to have someone help you when you read instructions, pamphlets, or other written materials from your doctor or pharmacy?: 1 - Never  Interpreter Needed?: No  Information entered by :: Alia T/cma   Activities of Daily Living     12/14/2023   10:56 PM  In your present state of health, do you have any difficulty performing the following activities:  Hearing? 0  Vision? 0  Difficulty concentrating or making decisions? 0  Walking or climbing stairs? 0  Dressing or bathing? 0  Doing errands, shopping? 0  Preparing Food and eating ? N  Using the Toilet? N  In the past six months, have you accidently leaked urine? Y  Do you have problems with loss of bowel control? N  Managing your Medications? N  Managing your Finances? N  Housekeeping or managing your Housekeeping? N    Patient Care Team: Albertha Huger, FNP as PCP - General (Family Medicine) Odie Benne, MD as PCP - Cardiology (Cardiology) Alexia Idler, OD (Optometry)  I have updated your Care Teams  any recent Medical Services you may have received from other providers in the past year.     Assessment:    This is a routine wellness examination for Maytal.  Hearing/Vision screen Hearing Screening - Comments:: Pt denies hearing dif Vision Screening - Comments:: Pt wear glasses denies vision/pt goes to Dr. Lincoln Renshaw w/[facility] in [place], [place] last ov was in Sept/Oct of 2024   Goals Addressed             This Visit's Progress    Patient Stated   On track    Wants to get back to be able to walk 3 miles per day 3-4 days per week Right now only tolerating 1 mile       Depression Screen     12/18/2023    8:11 AM 09/18/2023    8:33 AM 05/26/2023    8:23 AM 02/21/2023    7:55 AM 12/17/2022    8:08 AM 12/11/2022    3:06 PM 08/22/2022    7:58 AM  PHQ 2/9 Scores  PHQ - 2 Score 0 0 0 0 0 0 0  PHQ- 9 Score 0 0 0 0 0 0 0    Fall Risk     12/14/2023   10:56 PM 09/18/2023    8:33 AM 05/26/2023    8:23 AM 02/21/2023    7:54 AM 12/17/2022    8:07 AM  Fall Risk   Falls in the past year? 0 1 0 0 1  Number falls in past yr: 0 0   0  Injury with Fall? 0 0   1  Risk for fall due to :  History of fall(s)     Follow up  Falls evaluation completed   Falls evaluation completed;Education provided;Falls prevention discussed    MEDICARE RISK AT HOME:  Medicare Risk at Home Any stairs in or around the home?: (Patient-Rptd) Yes If so, are there any without handrails?: (Patient-Rptd) Yes Home free of loose throw rugs in walkways, pet beds, electrical cords, etc?: (Patient-Rptd) Yes Adequate lighting in your home to reduce risk of falls?: (  Patient-Rptd) Yes Life alert?: (Patient-Rptd) No Use of a cane, walker or w/c?: (Patient-Rptd) No Grab bars in the bathroom?: (Patient-Rptd) Yes Shower chair or bench in shower?: (Patient-Rptd) Yes Elevated toilet seat or a handicapped toilet?: (Patient-Rptd) No  TIMED UP AND GO:  Was the test performed?  no  Cognitive Function: 6CIT completed    12/08/2020     8:32 AM  MMSE - Mini Mental State Exam  Orientation to time 5  Orientation to Place 5  Registration 3  Attention/ Calculation 5  Recall 2  Language- name 2 objects 2  Language- repeat 1  Language- follow 3 step command 3  Language- read & follow direction 1  Write a sentence 1  Copy design 1  Total score 29        12/18/2023    8:12 AM 12/17/2022    8:10 AM 12/13/2021    9:19 AM  6CIT Screen  What Year? 0 points 0 points 0 points  What month? 0 points 0 points 0 points  What time? 0 points 0 points 0 points  Count back from 20 0 points 0 points 0 points  Months in reverse 0 points 0 points 0 points  Repeat phrase 0 points 0 points 0 points  Total Score 0 points 0 points 0 points    Immunizations Immunization History  Administered Date(s) Administered   Fluad Quad(high Dose 65+) 06/15/2020, 05/10/2021, 04/09/2022   Fluad Trivalent(High Dose 65+) 04/23/2023   Influenza, Seasonal, Injecte, Preservative Fre 06/01/2014   Influenza,inj,Quad PF,6+ Mos 06/01/2014, 05/02/2017, 04/30/2018, 06/22/2019   Influenza-Unspecified 06/01/2014, 05/02/2017, 04/30/2018   PFIZER(Purple Top)SARS-COV-2 Vaccination 03/22/2020, 04/12/2020   PNEUMOCOCCAL CONJUGATE-20 09/18/2023   Pneumococcal Conjugate-13 10/12/2020   Pneumococcal Polysaccharide-23 05/27/2017   Tdap 07/16/2015    Screening Tests Health Maintenance  Topic Date Due   Zoster Vaccines- Shingrix  (1 of 2) Never done   COVID-19 Vaccine (3 - 2024-25 season) 10/03/2024 (Originally 03/16/2023)   INFLUENZA VACCINE  02/13/2024   MAMMOGRAM  02/17/2024   FOOT EXAM  02/21/2024   HEMOGLOBIN A1C  03/20/2024   Diabetic kidney evaluation - Urine ACR  04/16/2024   OPHTHALMOLOGY EXAM  05/04/2024   Diabetic kidney evaluation - eGFR measurement  05/25/2024   Medicare Annual Wellness (AWV)  12/17/2024   DEXA SCAN  05/08/2025   DTaP/Tdap/Td (2 - Td or Tdap) 07/15/2025   Colonoscopy  05/06/2032   Pneumonia Vaccine 39+ Years old  Completed    Hepatitis C Screening  Completed   HPV VACCINES  Aged Out   Meningococcal B Vaccine  Aged Out   Fecal DNA (Cologuard)  Discontinued    Health Maintenance  Health Maintenance Due  Topic Date Due   Zoster Vaccines- Shingrix  (1 of 2) Never done   Health Maintenance Items Addressed: See Nurse Notes at the end of this note  Additional Screening:  Vision Screening: Recommended annual ophthalmology exams for early detection of glaucoma and other disorders of the eye. Would you like a referral to an eye doctor? No    Dental Screening: Recommended annual dental exams for proper oral hygiene  Community Resource Referral / Chronic Care Management: CRR required this visit?  No   CCM required this visit?  No   Plan:    I have personally reviewed and noted the following in the patient's chart:   Medical and social history Use of alcohol, tobacco or illicit drugs  Current medications and supplements including opioid prescriptions. Patient is not currently taking opioid prescriptions. Functional ability and  status Nutritional status Physical activity Advanced directives List of other physicians Hospitalizations, surgeries, and ER visits in previous 12 months Vitals Screenings to include cognitive, depression, and falls Referrals and appointments  In addition, I have reviewed and discussed with patient certain preventive protocols, quality metrics, and best practice recommendations. A written personalized care plan for preventive services as well as general preventive health recommendations were provided to patient.   Michaelle Adolphus, CMA   12/18/2023   After Visit Summary: (MyChart) Due to this being a telephonic visit, the after visit summary with patients personalized plan was offered to patient via MyChart   Notes: Pt is aware and encouraged to get the shingles vaccine.

## 2023-12-18 NOTE — Patient Instructions (Signed)
 Cassie Hunter , Thank you for taking time out of your busy schedule to complete your Annual Wellness Visit with me. I enjoyed our conversation and look forward to speaking with you again next year. I, as well as your care team,  appreciate your ongoing commitment to your health goals. Please review the following plan we discussed and let me know if I can assist you in the future. Your Game plan/ To Do List    Follow up Visits: Next Medicare AWV with our clinical staff: 12/20/24 at 8:00a.m.   Have you seen your provider in the last 6 months (3 months if uncontrolled diabetes)? Yes Next Office Visit with your provider: 12/19/23 at 9:00a.m.  Clinician Recommendations:  Aim for 30 minutes of exercise or brisk walking, 6-8 glasses of water, and 5 servings of fruits and vegetables each day. N/a      This is a list of the screening recommended for you and due dates:  Health Maintenance  Topic Date Due   Zoster (Shingles) Vaccine (1 of 2) Never done   COVID-19 Vaccine (3 - 2024-25 season) 10/03/2024*   Flu Shot  02/13/2024   Mammogram  02/17/2024   Complete foot exam   02/21/2024   Hemoglobin A1C  03/20/2024   Yearly kidney health urinalysis for diabetes  04/16/2024   Eye exam for diabetics  05/04/2024   Yearly kidney function blood test for diabetes  05/25/2024   Medicare Annual Wellness Visit  12/17/2024   DEXA scan (bone density measurement)  05/08/2025   DTaP/Tdap/Td vaccine (2 - Td or Tdap) 07/15/2025   Colon Cancer Screening  05/06/2032   Pneumonia Vaccine  Completed   Hepatitis C Screening  Completed   HPV Vaccine  Aged Out   Meningitis B Vaccine  Aged Out   Cologuard (Stool DNA test)  Discontinued  *Topic was postponed. The date shown is not the original due date.    Advanced directives: (Declined) Advance directive discussed with you today. Even though you declined this today, please call our office should you change your mind, and we can give you the proper paperwork for you to fill  out. Advance Care Planning is important because it:  [x]  Makes sure you receive the medical care that is consistent with your values, goals, and preferences  [x]  It provides guidance to your family and loved ones and reduces their decisional burden about whether or not they are making the right decisions based on your wishes.  Follow the link provided in your after visit summary or read over the paperwork we have mailed to you to help you started getting your Advance Directives in place. If you need assistance in completing these, please reach out to us  so that we can help you!  See attachments for Preventive Care and Fall Prevention Tips.

## 2023-12-19 ENCOUNTER — Encounter: Payer: Self-pay | Admitting: Family Medicine

## 2023-12-19 ENCOUNTER — Ambulatory Visit: Admitting: Family Medicine

## 2023-12-19 VITALS — BP 122/67 | HR 76 | Temp 97.9°F | Ht 63.0 in | Wt 141.8 lb

## 2023-12-19 DIAGNOSIS — I700 Atherosclerosis of aorta: Secondary | ICD-10-CM

## 2023-12-19 DIAGNOSIS — K219 Gastro-esophageal reflux disease without esophagitis: Secondary | ICD-10-CM

## 2023-12-19 DIAGNOSIS — E1169 Type 2 diabetes mellitus with other specified complication: Secondary | ICD-10-CM | POA: Diagnosis not present

## 2023-12-19 DIAGNOSIS — I152 Hypertension secondary to endocrine disorders: Secondary | ICD-10-CM

## 2023-12-19 DIAGNOSIS — R1319 Other dysphagia: Secondary | ICD-10-CM | POA: Diagnosis not present

## 2023-12-19 DIAGNOSIS — E1159 Type 2 diabetes mellitus with other circulatory complications: Secondary | ICD-10-CM | POA: Diagnosis not present

## 2023-12-19 DIAGNOSIS — I471 Supraventricular tachycardia, unspecified: Secondary | ICD-10-CM | POA: Diagnosis not present

## 2023-12-19 DIAGNOSIS — E1165 Type 2 diabetes mellitus with hyperglycemia: Secondary | ICD-10-CM | POA: Diagnosis not present

## 2023-12-19 DIAGNOSIS — E785 Hyperlipidemia, unspecified: Secondary | ICD-10-CM | POA: Diagnosis not present

## 2023-12-19 LAB — BAYER DCA HB A1C WAIVED: HB A1C (BAYER DCA - WAIVED): 5.6 % (ref 4.8–5.6)

## 2023-12-19 NOTE — Progress Notes (Signed)
 Established Patient Office Visit  Subjective   Patient ID: Cassie Hunter, female    DOB: 02-24-1956  Age: 68 y.o. MRN: [id number]  Chief Complaint  Patient presents with   Medical Management of Chronic Issues    HPI T2DM Pt presents for follow up evaluation of Type 2 diabetes mellitus.  Current symptoms include paresthesia of the feet. Patient denies foot ulcerations, increased appetite, polydipsia, polyuria, visual disturbances, vomiting, and weight loss.  Current diabetic medications include: metformin  Compliant with meds - Yes  Current monitoring regimen: occasionally  2. HTN Complaint with meds - Yes Current Medications - losartan  Pertinent ROS:  Visual Disturbances - No Chest pain - No Dyspnea - No Palpitations - Occasionally LE edema - No  3. GERD/dysphagia Has seen specialist in [facility]. She will need 3 different procedures at [facility] hospital to have her esophagus stretching because her strictures are so tight. Still hasn't been able to get to appts yet due to transportation constraints. Reports symptoms are stable.   Past Medical History:  Diagnosis Date   Diabetes mellitus    Difficulty swallowing    Diverticulitis    Essential hypertension    GERD (gastroesophageal reflux disease)    History of esophageal stricture       ROS As per HPI.    Objective:     BP 122/67   Pulse 76   Temp 97.9 F (36.6 C) (Temporal)   Ht 5\' 3"  (1.6 m)   Wt 141 lb 12.8 oz (64.3 kg)   SpO2 98%   BMI 25.12 kg/m  Wt Readings from Last 3 Encounters:  12/19/23 141 lb 12.8 oz (64.3 kg)  12/18/23 146 lb (66.2 kg)  09/19/23 146 lb (66.2 kg)      Physical Exam Vitals and nursing note reviewed.  Constitutional:      General: She is not in acute distress.    Appearance: Normal appearance. She is not ill-appearing, toxic-appearing or diaphoretic.  Cardiovascular:     Rate and Rhythm: Normal rate and regular rhythm.     Heart sounds: Normal heart sounds. No murmur  heard. Pulmonary:     Effort: Pulmonary effort is normal. No respiratory distress.     Breath sounds: Normal breath sounds. No wheezing or rhonchi.  Abdominal:     General: Bowel sounds are normal. There is no distension.     Palpations: Abdomen is soft.     Tenderness: There is no abdominal tenderness. There is no guarding or rebound.  Musculoskeletal:     Cervical back: Neck supple. No rigidity.     Right lower leg: No edema.     Left lower leg: No edema.  Lymphadenopathy:     Cervical: No cervical adenopathy.  Skin:    General: Skin is warm and dry.  Neurological:     General: No focal deficit present.     Mental Status: She is alert and oriented to person, place, and time.     Motor: No weakness.     Gait: Gait normal.  Psychiatric:        Mood and Affect: Mood normal.        Behavior: Behavior normal.     No results found for any visits on 12/19/23.   The ASCVD Risk score (Arnett DK, et al., 2019) failed to calculate for the following reasons:   The valid HDL cholesterol range is 20 to 100 mg/dL    Assessment & Plan:   Cassie Hunter was seen today for medical management  of chronic issues.  Diagnoses and all orders for this visit:  Type 2 diabetes mellitus with hyperglycemia, without long-term current use of insulin  (HCC) A1c 5.6 today, at goal of <7. Continue metformin . On ARB and statin.  -     Bayer DCA Hb A1c Waived  Dyslipidemia associated with type 2 diabetes mellitus (HCC) On statin. Last LDL at 58.  Hypertension associated with diabetes (HCC) Well controlled on current regimen.   Supraventricular tachycardia (HCC) Well controlled with cardizem .   Gastroesophageal reflux disease, unspecified whether esophagitis present Esophageal dysphagia Continue PPI. Follow up with GI.   Aortic atherosclerosis (HCC) On statin and aspirin .   Return in about 3 months (around 03/20/2024) for chronic follow up.   The patient indicates understanding of these issues and  agrees with the plan.  Albertha Huger, FNP

## 2024-01-01 ENCOUNTER — Other Ambulatory Visit: Payer: Self-pay | Admitting: Family Medicine

## 2024-01-01 DIAGNOSIS — E1165 Type 2 diabetes mellitus with hyperglycemia: Secondary | ICD-10-CM

## 2024-01-04 ENCOUNTER — Encounter: Payer: Self-pay | Admitting: Family Medicine

## 2024-01-05 ENCOUNTER — Encounter: Payer: Self-pay | Admitting: Family Medicine

## 2024-01-07 ENCOUNTER — Ambulatory Visit (INDEPENDENT_AMBULATORY_CARE_PROVIDER_SITE_OTHER): Admitting: Family Medicine

## 2024-01-07 ENCOUNTER — Encounter: Payer: Self-pay | Admitting: Family Medicine

## 2024-01-07 VITALS — BP 144/72 | HR 85 | Temp 97.5°F | Ht 63.0 in | Wt 142.0 lb

## 2024-01-07 DIAGNOSIS — R1319 Other dysphagia: Secondary | ICD-10-CM | POA: Diagnosis not present

## 2024-01-07 DIAGNOSIS — H1033 Unspecified acute conjunctivitis, bilateral: Secondary | ICD-10-CM

## 2024-01-07 DIAGNOSIS — J069 Acute upper respiratory infection, unspecified: Secondary | ICD-10-CM

## 2024-01-07 DIAGNOSIS — I152 Hypertension secondary to endocrine disorders: Secondary | ICD-10-CM

## 2024-01-07 DIAGNOSIS — I10 Essential (primary) hypertension: Secondary | ICD-10-CM | POA: Diagnosis not present

## 2024-01-07 MED ORDER — GENTAMICIN SULFATE 0.3 % OP SOLN: 1.0000 [drp] | OPHTHALMIC | 0 refills | Status: AC

## 2024-01-07 MED ORDER — AMOXICILLIN-POT CLAVULANATE 400-57 MG/5ML PO SUSR: 875.0000 mg | Freq: Two times a day (BID) | ORAL | 0 refills | Status: AC

## 2024-01-07 NOTE — Progress Notes (Signed)
 Acute Office Visit  Subjective:     Patient ID: Cassie Hunter, female    DOB: [birth year], 68 y.o.   MRN: [id number]  Chief Complaint  Patient presents with   Cough    Cough This is a new problem. Episode onset: 7 days. The problem has been unchanged. The cough is Non-productive. Associated symptoms include ear pain, nasal congestion and a sore throat. Pertinent negatives include no chest pain, chills, fever, headaches, hemoptysis, shortness of breath or wheezing. Associated symptoms comments: Eyes matted with yellow drainage each morning. There is no history of asthma or COPD.   Unable to swallow pills due to dysphagia.   Review of Systems  Constitutional:  Negative for chills and fever.  HENT:  Positive for ear pain and sore throat.   Respiratory:  Positive for cough. Negative for hemoptysis, shortness of breath and wheezing.   Cardiovascular:  Negative for chest pain.  Neurological:  Negative for headaches.        Objective:    BP (!) 144/72   Pulse 85   Temp (!) 97.5 F (36.4 C) (Temporal)   Ht 5' 3 (1.6 m)   Wt 142 lb (64.4 kg)   SpO2 100%   BMI 25.15 kg/m    Physical Exam Vitals and nursing note reviewed.  Constitutional:      General: She is not in acute distress.    Appearance: She is not ill-appearing, toxic-appearing or diaphoretic.  HENT:     Right Ear: Tympanic membrane, ear canal and external ear normal.     Left Ear: Tympanic membrane, ear canal and external ear normal.     Nose: Congestion present.     Mouth/Throat:     Mouth: Mucous membranes are moist.     Pharynx: Oropharynx is clear.     Tonsils: No tonsillar exudate or tonsillar abscesses. 0 on the right. 0 on the left.   Eyes:     General: Lids are normal. Vision grossly intact. Gaze aligned appropriately.        Right eye: No foreign body, discharge or hordeolum.        Left eye: No foreign body, discharge or hordeolum.     Extraocular Movements:     Right eye: Normal  extraocular motion and no nystagmus.     Left eye: Normal extraocular motion and no nystagmus.     Conjunctiva/sclera:     Right eye: Right conjunctiva is injected.     Left eye: Left conjunctiva is injected.    Cardiovascular:     Rate and Rhythm: Normal rate and regular rhythm.     Heart sounds: Normal heart sounds. No murmur heard. Pulmonary:     Effort: Pulmonary effort is normal. No respiratory distress.     Breath sounds: Normal breath sounds. No wheezing, rhonchi or rales.   Musculoskeletal:     Cervical back: Neck supple. No rigidity.     Right lower leg: No edema.     Left lower leg: No edema.   Skin:    General: Skin is warm and dry.   Neurological:     General: No focal deficit present.     Mental Status: She is alert and oriented to person, place, and time.   Psychiatric:        Mood and Affect: Mood normal.        Behavior: Behavior normal.     No results found for any visits on 01/07/24.      Assessment &  Plan:   Cassie Hunter was seen today for cough.  Diagnoses and all orders for this visit:  Acute URI Augmentin as below. Liquid ordered due to dysphagia. Discussed symptomatic care and return precautions.  -     amoxicillin-clavulanate (AUGMENTIN) 400-57 MG/5ML suspension; Take 10.9 mLs (875 mg total) by mouth 2 (two) times daily for 7 days.  Esophageal dysphagia -     amoxicillin-clavulanate (AUGMENTIN) 400-57 MG/5ML suspension; Take 10.9 mLs (875 mg total) by mouth 2 (two) times daily for 7 days.  Acute bacterial conjunctivitis of both eyes Gentamicin as below.  -     gentamicin (GARAMYCIN) 0.3 % ophthalmic solution; Place 1 drop into both eyes every 4 (four) hours for 7 days.  HTN Mildly elevated. Likely due to decongestant use.   Return to office for new or worsening symptoms, or if symptoms persist.   The patient indicates understanding of these issues and agrees with the plan.  Cassie CHRISTELLA Search, FNP

## 2024-01-23 ENCOUNTER — Other Ambulatory Visit: Payer: Self-pay | Admitting: Family Medicine

## 2024-01-23 DIAGNOSIS — I700 Atherosclerosis of aorta: Secondary | ICD-10-CM

## 2024-01-23 DIAGNOSIS — E1165 Type 2 diabetes mellitus with hyperglycemia: Secondary | ICD-10-CM

## 2024-02-02 ENCOUNTER — Other Ambulatory Visit: Payer: Self-pay | Admitting: Family Medicine

## 2024-02-02 DIAGNOSIS — E1165 Type 2 diabetes mellitus with hyperglycemia: Secondary | ICD-10-CM

## 2024-02-02 DIAGNOSIS — I10 Essential (primary) hypertension: Secondary | ICD-10-CM

## 2024-02-20 ENCOUNTER — Encounter: Payer: Self-pay | Admitting: Family Medicine

## 2024-02-25 ENCOUNTER — Other Ambulatory Visit: Payer: Self-pay | Admitting: Family Medicine

## 2024-02-25 DIAGNOSIS — R1319 Other dysphagia: Secondary | ICD-10-CM

## 2024-03-05 ENCOUNTER — Other Ambulatory Visit: Payer: Self-pay | Admitting: Family Medicine

## 2024-03-05 DIAGNOSIS — Z1231 Encounter for screening mammogram for malignant neoplasm of breast: Secondary | ICD-10-CM

## 2024-03-10 ENCOUNTER — Ambulatory Visit
Admission: RE | Admit: 2024-03-10 | Discharge: 2024-03-10 | Disposition: A | Discharge status: Home / Self Care | Source: Ambulatory Visit | Attending: Family Medicine | Admitting: Family Medicine

## 2024-03-10 DIAGNOSIS — Z1231 Encounter for screening mammogram for malignant neoplasm of breast: Secondary | ICD-10-CM | POA: Diagnosis not present

## 2024-03-15 ENCOUNTER — Encounter: Payer: Self-pay | Admitting: Family Medicine

## 2024-03-15 ENCOUNTER — Other Ambulatory Visit: Payer: Self-pay | Admitting: Family Medicine

## 2024-03-15 DIAGNOSIS — E1165 Type 2 diabetes mellitus with hyperglycemia: Secondary | ICD-10-CM

## 2024-03-22 ENCOUNTER — Ambulatory Visit (INDEPENDENT_AMBULATORY_CARE_PROVIDER_SITE_OTHER)

## 2024-03-22 ENCOUNTER — Ambulatory Visit: Admitting: Family Medicine

## 2024-03-22 ENCOUNTER — Encounter: Payer: Self-pay | Admitting: Family Medicine

## 2024-03-22 VITALS — BP 131/67 | HR 83 | Temp 97.9°F | Ht 63.0 in | Wt 139.2 lb

## 2024-03-22 DIAGNOSIS — E785 Hyperlipidemia, unspecified: Secondary | ICD-10-CM | POA: Diagnosis not present

## 2024-03-22 DIAGNOSIS — R5383 Other fatigue: Secondary | ICD-10-CM

## 2024-03-22 DIAGNOSIS — E1165 Type 2 diabetes mellitus with hyperglycemia: Secondary | ICD-10-CM | POA: Diagnosis not present

## 2024-03-22 DIAGNOSIS — E538 Deficiency of other specified B group vitamins: Secondary | ICD-10-CM

## 2024-03-22 DIAGNOSIS — M25551 Pain in right hip: Secondary | ICD-10-CM

## 2024-03-22 DIAGNOSIS — I152 Hypertension secondary to endocrine disorders: Secondary | ICD-10-CM | POA: Diagnosis not present

## 2024-03-22 DIAGNOSIS — E1169 Type 2 diabetes mellitus with other specified complication: Secondary | ICD-10-CM | POA: Diagnosis not present

## 2024-03-22 DIAGNOSIS — Z0000 Encounter for general adult medical examination without abnormal findings: Secondary | ICD-10-CM

## 2024-03-22 DIAGNOSIS — Z0001 Encounter for general adult medical examination with abnormal findings: Secondary | ICD-10-CM

## 2024-03-22 DIAGNOSIS — E1159 Type 2 diabetes mellitus with other circulatory complications: Secondary | ICD-10-CM | POA: Diagnosis not present

## 2024-03-22 DIAGNOSIS — Z23 Encounter for immunization: Secondary | ICD-10-CM

## 2024-03-22 DIAGNOSIS — I700 Atherosclerosis of aorta: Secondary | ICD-10-CM | POA: Diagnosis not present

## 2024-03-22 DIAGNOSIS — M2142 Flat foot [pes planus] (acquired), left foot: Secondary | ICD-10-CM

## 2024-03-22 DIAGNOSIS — L989 Disorder of the skin and subcutaneous tissue, unspecified: Secondary | ICD-10-CM | POA: Diagnosis not present

## 2024-03-22 DIAGNOSIS — R1319 Other dysphagia: Secondary | ICD-10-CM

## 2024-03-22 LAB — LIPID PANEL

## 2024-03-22 LAB — BAYER DCA HB A1C WAIVED: HB A1C (BAYER DCA - WAIVED): 5.3 % (ref 4.8–5.6)

## 2024-03-22 MED ORDER — METFORMIN HCL 500 MG PO TABS: ORAL_TABLET | ORAL | 3 refills | Status: AC

## 2024-03-22 MED ORDER — LOSARTAN POTASSIUM 100 MG PO TABS: 100.0000 mg | ORAL_TABLET | Freq: Every day | ORAL | 3 refills | Status: AC

## 2024-03-22 MED ORDER — OMEPRAZOLE 40 MG PO CPDR: 40.0000 mg | DELAYED_RELEASE_CAPSULE | Freq: Two times a day (BID) | ORAL | 3 refills | Status: AC

## 2024-03-22 MED ORDER — SIMVASTATIN 20 MG PO TABS: 20.0000 mg | ORAL_TABLET | Freq: Every day | ORAL | 3 refills | Status: AC

## 2024-03-22 NOTE — Progress Notes (Signed)
 Complete physical exam  Patient: Cassie Hunter   DOB: May 05, 1956   68 y.o. Female  MRN: [id number]  Subjective:    Chief Complaint  Patient presents with   Annual Exam    Cassie Hunter is a 68 y.o. female who presents today for a complete physical exam. She reports consuming a general diet. The patient does not participate in regular exercise at present. She generally feels fairly well. She reports sleeping poorly. She does have additional problems to discuss today.   Right hip pain - Onset in June after feeling a pop while on vacation when walking - Pain radiates to the right knee - Worsens when lying on right side - Limits ambulation and leads to avoidance of walking with her daughter - No prior medical evaluation for this issue  Fatigue and decreased energy - Persistent fatigue and lack of energy - Impacts motivation and ability to exercise - Describes head as feeling 'funny'. Hx of vertigo - Avoids walking alone due to discomfort  Nocturia and sleep disturbance - Frequent nocturia disrupting sleep - Best sleep occurs between 6 AM and 9 or 10 AM - No bladder leakage except when holding urine too long  Cutaneous changes - Rough spots on face without recent changes  Peripheral neuropathy - Tingling in foot attributed to diabetic neuropathy - Manages symptoms with frankincense and myrrh cream, providing some relief  Post-spinal surgery sensations - History of spinal surgery in 1996 - Occasional 'tingly ache' due to exposed nerves     Bloods sugars have been well controlled. GERD, dysphagia is stable.   Most recent fall risk assessment:    01/07/2024    9:12 AM  Fall Risk   Falls in the past year? 0     Most recent depression screenings:    01/07/2024    9:12 AM 12/19/2023    8:42 AM  PHQ 2/9 Scores  PHQ - 2 Score 0 0  PHQ- 9 Score 0 0        Patient Care Team: Joesph Annabella HERO, FNP as PCP - General (Family Medicine) Verlin Lonni BIRCH, MD as PCP - Cardiology (Cardiology) Vicci Mcardle, OD [facility])   Outpatient Medications Prior to Visit  Medication Sig   albuterol  (VENTOLIN  HFA) 108 (90 Base) MCG/ACT inhaler Inhale 2 puffs into the lungs every 6 (six) hours as needed.   Alcohol Swabs (DROPSAFE ALCOHOL PREP) 70 % PADS Test BS daily and as needed Dx E11.9   aspirin  81 MG EC tablet Take 1 tablet (81 mg total) by mouth daily. Swallow whole.   Blood Glucose Calibration (TRUE METRIX LEVEL 1) Low SOLN Use with glucose monitor Dx E11.9   Blood Glucose Monitoring Suppl (TRUE METRIX AIR GLUCOSE METER) w/Device KIT Test BS daily and as needed Dx E11.9   diltiazem  (CARDIZEM  CD) 240 MG 24 hr capsule Take 1 capsule (240 mg total) by mouth daily.   diltiazem  (CARDIZEM ) 30 MG tablet Take 1 tablet (30 mg total) by mouth 4 (four) times daily as needed.   glucose blood (TRUE METRIX BLOOD GLUCOSE TEST) test strip Test BS daily and as needed Dx E11.9   losartan  (COZAAR ) 100 MG tablet TAKE 1 TABLET EVERY DAY   metFORMIN  (GLUCOPHAGE ) 500 MG tablet TAKE 2 TABLETS DAILY WITH BREAKFAST AND 1 TABLET DAILY WITH SUPPER.   omeprazole  (PRILOSEC) 40 MG capsule TAKE 1 CAPSULE BY MOUTH TWICE DAILY BEFORE A MEAL   simvastatin  (ZOCOR ) 20 MG tablet TAKE 1 TABLET EVERY DAY AT  6:00PM   TRUEplus Lancets 33G MISC Test BS daily and as needed Dx E11.9   No facility-administered medications prior to visit.    ROS Negative unless specially indicated above in HPI.     Objective:     BP 131/67   Pulse 83   Temp 97.9 F (36.6 C) (Temporal)   Ht 5' 3 (1.6 m)   Wt 139 lb 3.2 oz (63.1 kg)   SpO2 100%   BMI 24.66 kg/m  Wt Readings from Last 3 Encounters:  03/22/24 139 lb 3.2 oz (63.1 kg)  01/07/24 142 lb (64.4 kg)  12/19/23 141 lb 12.8 oz (64.3 kg)      Physical Exam Vitals and nursing note reviewed.  Constitutional:      General: She is not in acute distress.    Appearance: Normal appearance. She is not ill-appearing,  toxic-appearing or diaphoretic.  HENT:     Head: Normocephalic.     Right Ear: Tympanic membrane, ear canal and external ear normal.     Left Ear: Tympanic membrane, ear canal and external ear normal.     Nose: Nose normal.     Mouth/Throat:     Mouth: Mucous membranes are moist.     Pharynx: Oropharynx is clear.  Eyes:     Extraocular Movements: Extraocular movements intact.     Conjunctiva/sclera: Conjunctivae normal.     Pupils: Pupils are equal, round, and reactive to light.  Neck:     Thyroid : No thyroid  mass, thyromegaly or thyroid  tenderness.  Cardiovascular:     Rate and Rhythm: Normal rate and regular rhythm.     Pulses: Normal pulses.     Heart sounds: Normal heart sounds. No murmur heard.    No friction rub. No gallop.  Pulmonary:     Effort: Pulmonary effort is normal.     Breath sounds: Normal breath sounds.  Abdominal:     General: Bowel sounds are normal. There is no distension.     Palpations: Abdomen is soft. There is no mass.     Tenderness: There is no abdominal tenderness. There is no guarding.  Musculoskeletal:        General: No tenderness. Normal range of motion.     Cervical back: Normal range of motion and neck supple. No tenderness.     Right lower leg: No edema.     Left lower leg: No edema.  Skin:    General: Skin is warm and dry.     Capillary Refill: Capillary refill takes less than 2 seconds.     Findings: Lesion (scaly lesion to bilateral face. See picture below) present. No rash.  Neurological:     Mental Status: She is alert and oriented to person, place, and time. Mental status is at baseline.     Cranial Nerves: No cranial nerve deficit.     Motor: No weakness.     Coordination: Coordination normal.     Gait: Gait normal.     Deep Tendon Reflexes: Reflexes normal.  Psychiatric:        Mood and Affect: Mood normal.        Behavior: Behavior normal.        Thought Content: Thought content normal.        Judgment: Judgment normal.         No results found for any visits on 03/22/24.     Assessment & Plan:    Routine Health Maintenance and Physical Exam  Cassie Hunter was seen today for  annual exam.  Diagnoses and all orders for this visit:  Routine general medical examination at a health care facility  Type 2 diabetes mellitus with hyperglycemia, without long-term current use of insulin  (HCC) A1c at goal. On ARB and statin. Continue metformin .  -     Bayer DCA Hb A1c Waived -     simvastatin  (ZOCOR ) 20 MG tablet; Take 1 tablet (20 mg total) by mouth daily at 6 PM. -     metFORMIN  (GLUCOPHAGE ) 500 MG tablet; TAKE 2 TABLETS DAILY WITH BREAKFAST AND 1 TABLET DAILY WITH SUPPER. -     losartan  (COZAAR ) 100 MG tablet; Take 1 tablet (100 mg total) by mouth daily.  Hypertension associated with diabetes (HCC) Well controlled on current regimen.  -     CBC with Differential/Platelet -     CMP14+EGFR -     TSH -     losartan  (COZAAR ) 100 MG tablet; Take 1 tablet (100 mg total) by mouth daily.  Dyslipidemia associated with type 2 diabetes mellitus (HCC) On statin.  -     Lipid panel  Vitamin B12 deficiency -     Vitamin B12  Aortic atherosclerosis (HCC) On statin and aspirin .  -     simvastatin  (ZOCOR ) 20 MG tablet; Take 1 tablet (20 mg total) by mouth daily at 6 PM.  Acquired pes planus of left foot  Esophageal dysphagia Continue follow up with specialist.  -     omeprazole  (PRILOSEC) 40 MG capsule; Take 1 capsule (40 mg total) by mouth 2 (two) times daily.  Right hip pain Xray report pending.  -     DG Hip Unilat W OR W/O Pelvis 2-3 Views Right; Future  Facial lesion -     Ambulatory referral to Dermatology  Other fatigue Labs pending. Likely related to poor sleep. Declined OAB medication to help with nocturia today.   Encounter for immunization -     Flu vaccine HIGH DOSE PF(Fluzone Trivalent)    Immunization History  Administered Date(s) Administered   Fluad Quad(high Dose 65+) 06/15/2020,  05/10/2021, 04/09/2022   Fluad Trivalent(High Dose 65+) 04/23/2023   INFLUENZA, HIGH DOSE SEASONAL PF 03/22/2024   Influenza, Seasonal, Injecte, Preservative Fre 06/01/2014   Influenza,inj,Quad PF,6+ Mos 06/01/2014, 05/02/2017, 04/30/2018, 06/22/2019   Influenza-Unspecified 06/01/2014, 05/02/2017, 04/30/2018   PFIZER(Purple Top)SARS-COV-2 Vaccination 03/22/2020, 04/12/2020   PNEUMOCOCCAL CONJUGATE-20 09/18/2023   Pneumococcal Conjugate-13 10/12/2020   Pneumococcal Polysaccharide-23 05/27/2017   Tdap 07/16/2015    Health Maintenance  Topic Date Due   Influenza Vaccine  02/13/2024   FOOT EXAM  02/21/2024   Diabetic kidney evaluation - Urine ACR  04/16/2024   Zoster Vaccines- Shingrix  (1 of 2) 03/20/2025 (Originally 01/08/2005)   COVID-19 Vaccine (3 - 2025-26 season) 04/07/2025 (Originally 03/15/2024)   OPHTHALMOLOGY EXAM  05/04/2024   Diabetic kidney evaluation - eGFR measurement  05/25/2024   HEMOGLOBIN A1C  06/19/2024   Medicare Annual Wellness (AWV)  12/17/2024   MAMMOGRAM  03/10/2025   DEXA SCAN  05/08/2025   DTaP/Tdap/Td (2 - Td or Tdap) 07/15/2025   Colonoscopy  05/06/2032   Pneumococcal Vaccine: 50+ Years  Completed   Hepatitis C Screening  Completed   HPV VACCINES  Aged Out   Meningococcal B Vaccine  Aged Out   Fecal DNA (Cologuard)  Discontinued    Discussed health benefits of physical activity, and encouraged her to engage in regular exercise appropriate for her age and condition.  Problem List Items Addressed This Visit  Cardiovascular and Mediastinum   Aortic atherosclerosis (HCC)   Relevant Medications   simvastatin  (ZOCOR ) 20 MG tablet   losartan  (COZAAR ) 100 MG tablet   Hypertension associated with diabetes (HCC)   Relevant Medications   simvastatin  (ZOCOR ) 20 MG tablet   metFORMIN  (GLUCOPHAGE ) 500 MG tablet   losartan  (COZAAR ) 100 MG tablet   Other Relevant Orders   CBC with Differential/Platelet   CMP14+EGFR   TSH     Digestive   Esophageal  dysphagia   Relevant Medications   omeprazole  (PRILOSEC) 40 MG capsule     Endocrine   Type 2 diabetes mellitus with hyperglycemia, without long-term current use of insulin  (HCC)   Relevant Medications   simvastatin  (ZOCOR ) 20 MG tablet   metFORMIN  (GLUCOPHAGE ) 500 MG tablet   losartan  (COZAAR ) 100 MG tablet   Other Relevant Orders   Bayer DCA Hb A1c Waived   Dyslipidemia associated with type 2 diabetes mellitus (HCC)   Relevant Medications   simvastatin  (ZOCOR ) 20 MG tablet   metFORMIN  (GLUCOPHAGE ) 500 MG tablet   losartan  (COZAAR ) 100 MG tablet   Other Relevant Orders   Lipid panel     Other   Vitamin B12 deficiency   Relevant Orders   Vitamin B12   Acquired pes planus of left foot   Other Visit Diagnoses       Routine general medical examination at a health care facility    -  Primary     Right hip pain       Relevant Orders   DG Hip Unilat W OR W/O Pelvis 2-3 Views Right     Facial lesion       Relevant Orders   Ambulatory referral to Dermatology     Other fatigue         Encounter for immunization       Relevant Orders   Flu vaccine HIGH DOSE PF(Fluzone Trivalent) (Completed)      Return in about 3 months (around 06/21/2024) for chronic follow up.   The patient indicates understanding of these issues and agrees with the plan.  Annabella CHRISTELLA Search, FNP

## 2024-03-23 LAB — CBC WITH DIFFERENTIAL/PLATELET
Basophils Absolute: 0.1 x10E3/uL (ref 0.0–0.2)
Basos: 1 %
EOS (ABSOLUTE): 0.6 x10E3/uL — ABNORMAL HIGH (ref 0.0–0.4)
Eos: 12 %
Hematocrit: 33.1 % — ABNORMAL LOW (ref 34.0–46.6)
Hemoglobin: 11.3 g/dL (ref 11.1–15.9)
Immature Grans (Abs): 0 x10E3/uL (ref 0.0–0.1)
Immature Granulocytes: 0 %
Lymphocytes Absolute: 1 x10E3/uL (ref 0.7–3.1)
Lymphs: 19 %
MCH: 28.9 pg (ref 26.6–33.0)
MCHC: 34.1 g/dL (ref 31.5–35.7)
MCV: 85 fL (ref 79–97)
Monocytes Absolute: 0.4 x10E3/uL (ref 0.1–0.9)
Monocytes: 7 %
Neutrophils Absolute: 3.4 x10E3/uL (ref 1.4–7.0)
Neutrophils: 61 %
Platelets: 362 x10E3/uL (ref 150–450)
RBC: 3.91 x10E6/uL (ref 3.77–5.28)
RDW: 12.7 % (ref 11.7–15.4)
WBC: 5.5 x10E3/uL (ref 3.4–10.8)

## 2024-03-23 LAB — TSH: TSH: 0.584 u[IU]/mL (ref 0.450–4.500)

## 2024-03-23 LAB — CMP14+EGFR
ALT: 7 IU/L (ref 0–32)
AST: 14 IU/L (ref 0–40)
Albumin: 4.2 g/dL (ref 3.9–4.9)
Alkaline Phosphatase: 87 IU/L (ref 44–121)
BUN/Creatinine Ratio: 7 — AB (ref 12–28)
BUN: 7 mg/dL — AB (ref 8–27)
Bilirubin Total: 0.4 mg/dL (ref 0.0–1.2)
CO2: 20 mmol/L (ref 20–29)
Calcium: 9 mg/dL (ref 8.7–10.3)
Chloride: 95 mmol/L — AB (ref 96–106)
Creatinine, Ser: 1.01 mg/dL — AB (ref 0.57–1.00)
Globulin, Total: 2.2 g/dL (ref 1.5–4.5)
Glucose: 102 mg/dL — AB (ref 70–99)
Potassium: 5.1 mmol/L (ref 3.5–5.2)
Sodium: 131 mmol/L — AB (ref 134–144)
Total Protein: 6.4 g/dL (ref 6.0–8.5)
eGFR: 60 mL/min/1.73 (ref >59)

## 2024-03-23 LAB — LIPID PANEL
Cholesterol, Total: 210 mg/dL — AB (ref 100–199)
HDL: 115 mg/dL (ref >39)
LDL CALC COMMENT:: 1.8 ratio (ref 0.0–4.4)
LDL Chol Calc (NIH): 81 mg/dL (ref 0–99)
Triglycerides: 80 mg/dL (ref 0–149)
VLDL Cholesterol Cal: 14 mg/dL (ref 5–40)

## 2024-03-23 LAB — VITAMIN B12: Vitamin B-12: 216 pg/mL — AB (ref 232–1245)

## 2024-03-24 ENCOUNTER — Ambulatory Visit: Payer: Self-pay | Admitting: Family Medicine

## 2024-03-24 DIAGNOSIS — E871 Hypo-osmolality and hyponatremia: Secondary | ICD-10-CM

## 2024-03-31 ENCOUNTER — Other Ambulatory Visit

## 2024-03-31 DIAGNOSIS — E871 Hypo-osmolality and hyponatremia: Secondary | ICD-10-CM

## 2024-03-31 LAB — BMP8+EGFR
BUN/Creatinine Ratio: 9 — ABNORMAL LOW (ref 12–28)
BUN: 8 mg/dL (ref 8–27)
CO2: 19 mmol/L — ABNORMAL LOW (ref 20–29)
Calcium: 9.1 mg/dL (ref 8.7–10.3)
Chloride: 92 mmol/L — ABNORMAL LOW (ref 96–106)
Creatinine, Ser: 0.89 mg/dL (ref 0.57–1.00)
Glucose: 152 mg/dL — ABNORMAL HIGH (ref 70–99)
Potassium: 4.8 mmol/L (ref 3.5–5.2)
Sodium: 129 mmol/L — ABNORMAL LOW (ref 134–144)
eGFR: 70 mL/min/1.73 (ref >59)

## 2024-04-01 ENCOUNTER — Ambulatory Visit: Payer: Self-pay | Admitting: Family Medicine

## 2024-04-01 DIAGNOSIS — E871 Hypo-osmolality and hyponatremia: Secondary | ICD-10-CM

## 2024-04-05 ENCOUNTER — Other Ambulatory Visit

## 2024-04-05 ENCOUNTER — Other Ambulatory Visit: Payer: Self-pay | Admitting: Family Medicine

## 2024-04-05 DIAGNOSIS — E871 Hypo-osmolality and hyponatremia: Secondary | ICD-10-CM | POA: Diagnosis not present

## 2024-04-05 DIAGNOSIS — I700 Atherosclerosis of aorta: Secondary | ICD-10-CM

## 2024-04-05 DIAGNOSIS — E1165 Type 2 diabetes mellitus with hyperglycemia: Secondary | ICD-10-CM

## 2024-04-06 LAB — BMP8+EGFR
BUN/Creatinine Ratio: 10 — ABNORMAL LOW (ref 12–28)
BUN: 9 mg/dL (ref 8–27)
CO2: 21 mmol/L (ref 20–29)
Calcium: 9.4 mg/dL (ref 8.7–10.3)
Chloride: 98 mmol/L (ref 96–106)
Creatinine, Ser: 0.87 mg/dL (ref 0.57–1.00)
Glucose: 102 mg/dL — ABNORMAL HIGH (ref 70–99)
Potassium: 4.9 mmol/L (ref 3.5–5.2)
Sodium: 135 mmol/L (ref 134–144)
eGFR: 72 mL/min/1.73 (ref >59)

## 2024-04-07 ENCOUNTER — Ambulatory Visit: Payer: Self-pay | Admitting: Family Medicine

## 2024-04-16 ENCOUNTER — Encounter: Payer: Self-pay | Admitting: Family Medicine

## 2024-04-17 ENCOUNTER — Encounter (HOSPITAL_COMMUNITY): Payer: Self-pay

## 2024-04-17 ENCOUNTER — Emergency Department (HOSPITAL_COMMUNITY)

## 2024-04-17 ENCOUNTER — Emergency Department (HOSPITAL_COMMUNITY)
Admission: EM | Admit: 2024-04-17 | Discharge: 2024-04-17 | Disposition: A | Discharge status: Home / Self Care | Attending: Emergency Medicine | Admitting: Emergency Medicine

## 2024-04-17 ENCOUNTER — Other Ambulatory Visit: Payer: Self-pay

## 2024-04-17 DIAGNOSIS — E871 Hypo-osmolality and hyponatremia: Secondary | ICD-10-CM | POA: Insufficient documentation

## 2024-04-17 DIAGNOSIS — R509 Fever, unspecified: Secondary | ICD-10-CM | POA: Diagnosis not present

## 2024-04-17 DIAGNOSIS — R519 Headache, unspecified: Secondary | ICD-10-CM | POA: Diagnosis not present

## 2024-04-17 LAB — URINALYSIS, ROUTINE W REFLEX MICROSCOPIC
Bilirubin Urine: NEGATIVE
Glucose, UA: NEGATIVE mg/dL
Hgb urine dipstick: NEGATIVE
Ketones, ur: 5 mg/dL — AB
Leukocytes,Ua: NEGATIVE
Nitrite: NEGATIVE
Protein, ur: NEGATIVE mg/dL
Specific Gravity, Urine: 1.023 (ref 1.005–1.030)
pH: 7 (ref 5.0–8.0)

## 2024-04-17 LAB — COMPREHENSIVE METABOLIC PANEL WITH GFR
ALT: 9 U/L (ref 0–44)
AST: 17 U/L (ref 15–41)
Albumin: 4.3 g/dL (ref 3.5–5.0)
Alkaline Phosphatase: 69 U/L (ref 38–126)
Anion gap: 10 (ref 5–15)
BUN: 12 mg/dL (ref 8–23)
CO2: 22 mmol/L (ref 22–32)
Calcium: 9.1 mg/dL (ref 8.9–10.3)
Chloride: 95 mmol/L — ABNORMAL LOW (ref 98–111)
Creatinine, Ser: 0.79 mg/dL (ref 0.44–1.00)
GFR, Estimated: >60 mL/min (ref >60)
Glucose, Bld: 114 mg/dL — ABNORMAL HIGH (ref 70–99)
Potassium: 4.3 mmol/L (ref 3.5–5.1)
Sodium: 127 mmol/L — ABNORMAL LOW (ref 135–145)
Total Bilirubin: 0.3 mg/dL (ref 0.0–1.2)
Total Protein: 6.7 g/dL (ref 6.5–8.1)

## 2024-04-17 LAB — CBC WITH DIFFERENTIAL/PLATELET
Abs Immature Granulocytes: 0.02 K/uL (ref 0.00–0.07)
Basophils Absolute: 0 K/uL (ref 0.0–0.1)
Basophils Relative: 1 %
Eosinophils Absolute: 0.3 K/uL (ref 0.0–0.5)
Eosinophils Relative: 6 %
HCT: 33.6 % — ABNORMAL LOW (ref 36.0–46.0)
Hemoglobin: 11.2 g/dL — ABNORMAL LOW (ref 12.0–15.0)
Immature Granulocytes: 0 %
Lymphocytes Relative: 20 %
Lymphs Abs: 1 K/uL (ref 0.7–4.0)
MCH: 28.1 pg (ref 26.0–34.0)
MCHC: 33.3 g/dL (ref 30.0–36.0)
MCV: 84.4 fL (ref 80.0–100.0)
Monocytes Absolute: 0.3 K/uL (ref 0.1–1.0)
Monocytes Relative: 6 %
Neutro Abs: 3.3 K/uL (ref 1.7–7.7)
Neutrophils Relative %: 67 %
Platelets: 314 K/uL (ref 150–400)
RBC: 3.98 MIL/uL (ref 3.87–5.11)
RDW: 12.3 % (ref 11.5–15.5)
WBC: 4.9 K/uL (ref 4.0–10.5)
nRBC: 0 % (ref 0.0–0.2)

## 2024-04-17 LAB — RESP PANEL BY RT-PCR (RSV, FLU A&B, COVID)  RVPGX2
Influenza A by PCR: NEGATIVE
Influenza B by PCR: NEGATIVE
Resp Syncytial Virus by PCR: NEGATIVE
SARS Coronavirus 2 by RT PCR: NEGATIVE

## 2024-04-17 LAB — CBG MONITORING, ED: Glucose-Capillary: 111 mg/dL — ABNORMAL HIGH (ref 70–99)

## 2024-04-17 MED ORDER — MECLIZINE HCL 12.5 MG PO TABS
25.0000 mg | ORAL_TABLET | Freq: Once | ORAL | Status: AC
Start: 2024-04-17 — End: 2024-04-17
  Administered 2024-04-17: 25 mg via ORAL
  Filled 2024-04-17: qty 2

## 2024-04-17 MED ORDER — LACTATED RINGERS IV BOLUS
1000.0000 mL | Freq: Once | INTRAVENOUS | Status: AC
Start: 2024-04-17 — End: 2024-04-17
  Administered 2024-04-17: 1000 mL via INTRAVENOUS

## 2024-04-17 MED ORDER — ACETAMINOPHEN 325 MG PO TABS
650.0000 mg | ORAL_TABLET | Freq: Once | ORAL | Status: AC
  Administered 2024-04-17: 650 mg via ORAL
  Filled 2024-04-17: qty 2

## 2024-04-17 MED ORDER — MECLIZINE HCL 25 MG PO TABS: 25.0000 mg | ORAL_TABLET | Freq: Two times a day (BID) | ORAL | 0 refills | Status: AC | PRN

## 2024-04-17 NOTE — ED Triage Notes (Addendum)
 Pt c/o headache, dizziness, weakness, and feeling off balance since yesterday when waking up around 1040 am. EDP at bedside during triage.

## 2024-04-17 NOTE — ED Provider Notes (Signed)
 [place] EMERGENCY DEPARTMENT AT [facility] Provider Note   CSN: [id number] Arrival date & time: 04/17/24  9095     Patient presents with: Headache   Cassie Hunter is a 68 y.o. female.   HPI 68 year old female presents with a chief complaint of headache.  Headache started yesterday morning.  It was pretty mild and mostly a right frontal headache, then became a diffuse frontal headache and now is all over.  Right now is not as bad as it was earlier.  She took some Tylenol  yesterday with some partial relief.  She had a temperature of 100 yesterday and then 99 later.  No new cough or sore throat.  No vomiting though she has had a little nausea.  No focal weakness but feels like both legs are weak.  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  She feels dizzy when she tries to get up and walk.  Feels like she is off balance.  No new neck pain or stiffness.  Prior to Admission medications   Medication Sig Start Date End Date Taking? Authorizing Provider  meclizine  (ANTIVERT ) 25 MG tablet Take 1 tablet (25 mg total) by mouth 2 (two) times daily as needed for dizziness. 04/17/24  Yes Freddi Hamilton, MD  albuterol  (VENTOLIN  HFA) 108 [phone] Base) MCG/ACT inhaler Inhale 2 puffs into the lungs every 6 (six) hours as needed. 10/12/20   Merlynn Niki FALCON, FNP  Alcohol Swabs (DROPSAFE ALCOHOL PREP) 70 % PADS Test BS daily and as needed Dx E11.9 10/07/23   Joesph Annabella HERO, FNP  aspirin  81 MG EC tablet Take 1 tablet (81 mg total) by mouth daily. Swallow whole. 05/15/21   Merlynn Niki FALCON, FNP  Blood Glucose Calibration (TRUE METRIX LEVEL 1) Low SOLN Use with glucose monitor Dx E11.9 05/11/20   Dettinger, Fonda LABOR, MD  Blood Glucose Monitoring Suppl (TRUE METRIX AIR GLUCOSE METER) w/Device KIT Test BS daily and as needed Dx E11.9 10/07/23   Joesph Annabella HERO, FNP  diltiazem  (CARDIZEM  CD) 240 MG 24 hr capsule Take 1 capsule (240 mg total) by mouth daily. 10/09/23    Verlin Lonni BIRCH, MD  diltiazem  (CARDIZEM ) 30 MG tablet Take 1 tablet (30 mg total) by mouth 4 (four) times daily as needed. 08/06/21   Verlin Lonni BIRCH, MD  glucose blood (TRUE METRIX BLOOD GLUCOSE TEST) test strip Test BS daily and as needed Dx E11.9 01/13/23   Joesph Annabella HERO, FNP  losartan  (COZAAR ) 100 MG tablet Take 1 tablet (100 mg total) by mouth daily. 03/22/24   Joesph Annabella HERO, FNP  metFORMIN  (GLUCOPHAGE ) 500 MG tablet TAKE 2 TABLETS DAILY WITH BREAKFAST AND 1 TABLET DAILY WITH SUPPER. 03/22/24   Joesph Annabella HERO, FNP  omeprazole  (PRILOSEC) 40 MG capsule Take 1 capsule (40 mg total) by mouth 2 (two) times daily. 03/22/24   Joesph Annabella HERO, FNP  simvastatin  (ZOCOR ) 20 MG tablet Take 1 tablet (20 mg total) by mouth daily at 6 PM. 03/22/24   Joesph Annabella HERO, FNP  TRUEplus Lancets 33G MISC Test BS daily and as needed Dx E11.9 10/07/23   Joesph Annabella HERO, FNP    Allergies: Codeine and Neosporin [bacitracin-polymyxin b]    Review of Systems  Constitutional:  Positive for fever.  Gastrointestinal:  Positive for nausea. Negative for vomiting.  Musculoskeletal:  Negative for neck stiffness.  Neurological:  Positive for dizziness and headaches.    Updated Vital Signs BP 139/70   Pulse 85  Temp 99.1 F (37.3 C) (Oral)   Resp 14   Ht 5' 3 (1.6 m)   Wt 63.1 kg   SpO2 97%   BMI 24.64 kg/m   Physical Exam Vitals and nursing note reviewed.  Constitutional:      General: She is not in acute distress.    Appearance: She is well-developed. She is not ill-appearing or diaphoretic.  HENT:     Head: Normocephalic and atraumatic.  Eyes:     Extraocular Movements: Extraocular movements intact.     Pupils: Pupils are equal, round, and reactive to light.  Cardiovascular:     Rate and Rhythm: Normal rate and regular rhythm.     Heart sounds: Normal heart sounds.     Comments: HR 90s Pulmonary:     Effort: Pulmonary effort is normal.     Breath sounds: Normal breath sounds.   Abdominal:     Palpations: Abdomen is soft.     Tenderness: There is no abdominal tenderness.  Musculoskeletal:     Cervical back: No rigidity.  Skin:    General: Skin is warm and dry.  Neurological:     Mental Status: She is alert.     Comments: CN 3-12 grossly intact. 5/5 strength in all 4 extremities. Normal finger to nose.      (all labs ordered are listed, but only abnormal results are displayed) Labs Reviewed  CBC WITH DIFFERENTIAL/PLATELET - Abnormal; Notable for the following components:      Result Value   Hemoglobin 11.2 (*)    HCT 33.6 (*)    All other components within normal limits  URINALYSIS, ROUTINE W REFLEX MICROSCOPIC - Abnormal; Notable for the following components:   Ketones, ur 5 (*)    All other components within normal limits  COMPREHENSIVE METABOLIC PANEL WITH GFR - Abnormal; Notable for the following components:   Sodium 127 (*)    Chloride 95 (*)    Glucose, Bld 114 (*)    All other components within normal limits  CBG MONITORING, ED - Abnormal; Notable for the following components:   Glucose-Capillary 111 (*)    All other components within normal limits  RESP PANEL BY RT-PCR (RSV, FLU A&B, COVID)  [id number]    EKG: EKG Interpretation Date/Time:  Saturday April 17 2024 09:58:25 EDT Ventricular Rate:  99 PR Interval:  174 QRS Duration:  115 QT Interval:  336 QTC Calculation: 432 R Axis:   -57  Text Interpretation: Sinus rhythm Nonspecific IVCD with LAD Inferior infarct, old Anterior infarct, old Confirmed by Freddi Hamilton [phone]) on 04/17/2024 10:47:47 AM  Radiology: CT Head Wo Contrast Result Date: 04/17/2024 CLINICAL DATA:  Headache. EXAM: CT HEAD WITHOUT CONTRAST TECHNIQUE: Contiguous axial images were obtained from the base of the skull through the vertex without intravenous contrast. RADIATION DOSE REDUCTION: This exam was performed according to the departmental dose-optimization program which includes automated exposure control,  adjustment of the mA and/or kV according to patient size and/or use of iterative reconstruction technique. COMPARISON:  12/03/2022 FINDINGS: Brain: There is no evidence for acute hemorrhage, hydrocephalus, mass lesion, or abnormal extra-axial fluid collection. No definite CT evidence for acute infarction. Vascular: No hyperdense vessel or unexpected calcification. Skull: No evidence for fracture. No worrisome lytic or sclerotic lesion. Sinuses/Orbits: The visualized paranasal sinuses and mastoid air cells are clear. Visualized portions of the globes and intraorbital fat are unremarkable. Other: None. IMPRESSION: No acute intracranial abnormality. Electronically Signed   By: Camellia Candle M.D.   On:  04/17/2024 10:58     Procedures   Medications Ordered in the ED  lactated ringers bolus 1,000 mL (0 mLs Intravenous Stopped 04/17/24 1309)  acetaminophen  (TYLENOL ) tablet 650 mg (650 mg Oral Given 04/17/24 1015)  meclizine  (ANTIVERT ) tablet 25 mg (25 mg Oral Given 04/17/24 1015)                                    Medical Decision Making Amount and/or Complexity of Data Reviewed External Data Reviewed: notes. Labs: ordered.    Details: Normal WBC Moderate hyponatremia Radiology: ordered and independent interpretation performed.    Details: No head bleed ECG/medicine tests: ordered and independent interpretation performed.    Details: No ischemia  Risk OTC drugs.   Patient presents with headache and some dizziness.  No focal deficits.  Was given some Tylenol , fluids, meclizine .  She reports this is similar to when she was here back in May 2024.  At that time, also felt better with those interventions.  Here her head CT is negative.  No meningismus or altered mental status.  She does have a low-grade temp of 100.5.  She is feeling a lot better with treatment and is able to ambulate without difficulty.  No ataxia.  I doubt acute stroke such as cerebellar stroke.  Labs do show a moderate  hyponatremia, could be contributing.  She was given some IV fluids.  Hyponatremia seems to be an acute on chronic problem.  However I do not think this is low enough to warrant admission.  She is also feeling better and I think is reasonable to let her go home and follow-up closely with PCP.  Discussed with patient, low suspicion for a CNS infection despite the low-grade temp.  She is otherwise well-appearing.  She does not want an LP and I agree that at this point I do not think she needs 1.  Otherwise, no other signs of an obvious infection.  Will give return precautions but she appears stable for discharge.  Signs of sepsis.      Final diagnoses:  Frontal headache  Hyponatremia    ED Discharge Orders          Ordered    meclizine  (ANTIVERT ) 25 MG tablet  2 times daily PRN        04/17/24 1317               Freddi Hamilton, MD 04/17/24 1427

## 2024-04-17 NOTE — Discharge Instructions (Addendum)
 Your sodium level today is 127.  This is moderately low.  You need to follow-up with your primary care provider, call their office on Monday morning to set up an urgent outpatient follow-up and recheck of your sodium.  However, if your headache worsens, does not improve, or if you develop any other new/concerning symptoms then return to the ER.

## 2024-04-22 ENCOUNTER — Encounter: Payer: Self-pay | Admitting: Family Medicine

## 2024-04-22 ENCOUNTER — Ambulatory Visit (INDEPENDENT_AMBULATORY_CARE_PROVIDER_SITE_OTHER): Admitting: Family Medicine

## 2024-04-22 VITALS — BP 125/67 | HR 76 | Temp 97.4°F | Ht 63.0 in | Wt 138.8 lb

## 2024-04-22 DIAGNOSIS — R42 Dizziness and giddiness: Secondary | ICD-10-CM | POA: Diagnosis not present

## 2024-04-22 DIAGNOSIS — R519 Headache, unspecified: Secondary | ICD-10-CM

## 2024-04-22 DIAGNOSIS — R509 Fever, unspecified: Secondary | ICD-10-CM | POA: Diagnosis not present

## 2024-04-22 DIAGNOSIS — E871 Hypo-osmolality and hyponatremia: Secondary | ICD-10-CM

## 2024-04-22 DIAGNOSIS — D649 Anemia, unspecified: Secondary | ICD-10-CM | POA: Diagnosis not present

## 2024-04-22 LAB — BMP8+EGFR
BUN/Creatinine Ratio: 9 — ABNORMAL LOW (ref 12–28)
BUN: 8 mg/dL (ref 8–27)
CO2: 21 mmol/L (ref 20–29)
Calcium: 9.1 mg/dL (ref 8.7–10.3)
Chloride: 96 mmol/L (ref 96–106)
Creatinine, Ser: 0.92 mg/dL (ref 0.57–1.00)
Glucose: 115 mg/dL — ABNORMAL HIGH (ref 70–99)
Potassium: 4.8 mmol/L (ref 3.5–5.2)
Sodium: 132 mmol/L — ABNORMAL LOW (ref 134–144)
eGFR: 67 mL/min/{1.73_m2}

## 2024-04-22 LAB — CBC WITH DIFFERENTIAL/PLATELET
Basophils Absolute: 0.1 10*3/uL (ref 0.0–0.2)
Basos: 1 %
EOS (ABSOLUTE): 0.7 10*3/uL — ABNORMAL HIGH (ref 0.0–0.4)
Eos: 12 %
Hematocrit: 32.9 % — ABNORMAL LOW (ref 34.0–46.6)
Hemoglobin: 10.5 g/dL — ABNORMAL LOW (ref 11.1–15.9)
Immature Grans (Abs): 0 10*3/uL (ref 0.0–0.1)
Immature Granulocytes: 0 %
Lymphocytes Absolute: 1.2 10*3/uL (ref 0.7–3.1)
Lymphs: 20 %
MCH: 27.7 pg (ref 26.6–33.0)
MCHC: 31.9 g/dL (ref 31.5–35.7)
MCV: 87 fL (ref 79–97)
Monocytes Absolute: 0.4 10*3/uL (ref 0.1–0.9)
Monocytes: 6 %
Neutrophils Absolute: 3.5 10*3/uL (ref 1.4–7.0)
Neutrophils: 61 %
Platelets: 395 10*3/uL (ref 150–450)
RBC: 3.79 x10E6/uL (ref 3.77–5.28)
RDW: 12.6 % (ref 11.7–15.4)
WBC: 5.8 10*3/uL (ref 3.4–10.8)

## 2024-04-22 NOTE — Progress Notes (Signed)
 Established Patient Office Visit  Subjective   Patient ID: Cassie Hunter, female    DOB: [birth year]  Age: 68 y.o. MRN: [id number]  Chief Complaint  Patient presents with   Headache    HPI  History of Present Illness   Cassie Hunter is a 68 year old female who presents with persistent dizziness and low sodium levels.  She initially experienced severe headaches, nausea, and a low-grade fever last Friday. The headache was across the forehead and sometimes behind the right eye. She has a history of a cataract in the right eye and is scheduled to see a doctor for it on November 6.  She experienced unsteadiness and 'wobbly' sensations, particularly when standing, describing her legs as feeling like 'wet noodles.' She visited the emergency room the following day due to these symptoms. In the ER, she underwent an EKG, urine test, and a CT scan of the head, all of which were normal. Her sodium level was found to be low at 127, and she received fluids. She was also given Tylenol  and Meclizine .  Currently, she still feels wobbly at times, especially when turning quickly, and relies on a grab bar in the shower for stability. She mentions an episode of dizziness while rinsing her hair. Dizziness has improved since ER visit.   She has been alternating between water and Gatorade for hydration, consuming about 40 ounces of fluid daily, plus two cups of coffee and sometimes a glass of milk. She finds it difficult to swallow large amounts of liquid quickly, which limits her fluid intake. She has been using salt in her diet, although she finds it unpalatable after not using it for a long time.  No excessive thirst and no significant changes in symptoms when sodium levels were normal. No confusion, weakness. No recent changes in medication. Not on thiazide diuretics. Fever has resolved. HA has resolved.        ROS As per HPI.    Objective:     BP 125/67   Pulse 76   Temp (!) 97.4 F  (36.3 C) (Temporal)   Ht 5' 3 (1.6 m)   Wt 138 lb 12.8 oz (63 kg)   SpO2 99%   BMI 24.59 kg/m    Physical Exam Vitals and nursing note reviewed.  Constitutional:      General: She is not in acute distress.    Appearance: Normal appearance. She is not ill-appearing, toxic-appearing or diaphoretic.  Cardiovascular:     Rate and Rhythm: Regular rhythm.     Heart sounds: Normal heart sounds. No murmur heard. Pulmonary:     Effort: Pulmonary effort is normal. No respiratory distress.     Breath sounds: Normal breath sounds. No wheezing or rhonchi.  Abdominal:     General: Bowel sounds are normal. There is no distension.     Palpations: Abdomen is soft.     Tenderness: There is no abdominal tenderness. There is no guarding or rebound.  Musculoskeletal:     Cervical back: No rigidity.     Right lower leg: No edema.     Left lower leg: No edema.  Skin:    General: Skin is warm and dry.  Neurological:     General: No focal deficit present.     Mental Status: She is alert and oriented to person, place, and time.     Motor: No weakness.     Gait: Gait normal.  Psychiatric:        Mood and  Affect: Mood normal.        Behavior: Behavior normal.      No results found for any visits on 04/22/24.    The ASCVD Risk score (Arnett DK, et al., 2019) failed to calculate for the following reasons:   The valid HDL cholesterol range is 20 to 100 mg/dL    Assessment & Plan:   Cassie Hunter was seen today for headache.  Diagnoses and all orders for this visit:  Hyponatremia -     BMP8+EGFR -     Sodium, urine, random -     Osmolality, urine  Dizziness -     CBC with Differential/Platelet -     BMP8+EGFR  Fever, unspecified fever cause  Frontal headache      Hyponatremia Hyponatremia with sodium level of 127. Also recently had hyponatremia. Reviewed ER note, labs, imaging.  - Recheck sodium level  - Collect urine sample to assess sodium concentration and osmolality. - Advised  to avoid plain water; use electrolyte solutions like Propel, gatorade, or Liquid IV. - Encouraged adding salt to food as tolerated. - Plan to recheck sodium level next week if today's results are normal.  Dizziness Negative orthostatics today. BMP and CBC pending. Improving. Continue meclizine  prn.   Fever Now resolved. Suspect viral etiology.     Headache Now resolved.   Return to office for new or worsening symptoms, or if symptoms persist.   The patient indicates understanding of these issues and agrees with the plan.  Cassie CHRISTELLA Search, FNP

## 2024-04-23 ENCOUNTER — Ambulatory Visit: Payer: Self-pay | Admitting: Family Medicine

## 2024-04-23 DIAGNOSIS — E871 Hypo-osmolality and hyponatremia: Secondary | ICD-10-CM

## 2024-04-24 LAB — SODIUM, URINE, RANDOM: Sodium, Ur: 53 mmol/L

## 2024-04-24 LAB — OSMOLALITY, URINE: Osmolality, Ur: 315 mosm/kg

## 2024-04-25 LAB — IRON AND TIBC
Iron Saturation: 16 % (ref 15–55)
Iron: 60 ug/dL (ref 27–139)
Total Iron Binding Capacity: 366 ug/dL (ref 250–450)
UIBC: 306 ug/dL (ref 118–369)

## 2024-04-25 LAB — SPECIMEN STATUS REPORT

## 2024-04-25 LAB — FERRITIN: Ferritin: 28 ng/mL (ref 15–150)

## 2024-04-28 ENCOUNTER — Encounter (HOSPITAL_COMMUNITY): Payer: Self-pay

## 2024-04-28 ENCOUNTER — Ambulatory Visit: Payer: Self-pay | Admitting: Family Medicine

## 2024-04-28 ENCOUNTER — Other Ambulatory Visit: Payer: Self-pay

## 2024-04-28 ENCOUNTER — Emergency Department (HOSPITAL_COMMUNITY)
Admission: EM | Admit: 2024-04-28 | Discharge: 2024-04-28 | Disposition: A | Discharge status: Home / Self Care | Source: Ambulatory Visit

## 2024-04-28 ENCOUNTER — Ambulatory Visit (INDEPENDENT_AMBULATORY_CARE_PROVIDER_SITE_OTHER): Admitting: Family Medicine

## 2024-04-28 VITALS — BP 134/73 | HR 102 | Temp 97.9°F | Ht 63.0 in | Wt 135.6 lb

## 2024-04-28 DIAGNOSIS — Z7982 Long term (current) use of aspirin: Secondary | ICD-10-CM | POA: Diagnosis not present

## 2024-04-28 DIAGNOSIS — Z7984 Long term (current) use of oral hypoglycemic drugs: Secondary | ICD-10-CM | POA: Insufficient documentation

## 2024-04-28 DIAGNOSIS — R634 Abnormal weight loss: Secondary | ICD-10-CM | POA: Diagnosis not present

## 2024-04-28 DIAGNOSIS — R531 Weakness: Secondary | ICD-10-CM

## 2024-04-28 DIAGNOSIS — E279 Disorder of adrenal gland, unspecified: Secondary | ICD-10-CM | POA: Diagnosis not present

## 2024-04-28 DIAGNOSIS — E871 Hypo-osmolality and hyponatremia: Secondary | ICD-10-CM | POA: Diagnosis not present

## 2024-04-28 DIAGNOSIS — R110 Nausea: Secondary | ICD-10-CM | POA: Diagnosis not present

## 2024-04-28 DIAGNOSIS — E119 Type 2 diabetes mellitus without complications: Secondary | ICD-10-CM | POA: Diagnosis not present

## 2024-04-28 HISTORY — DX: Unspecified adrenocortical insufficiency: E27.40

## 2024-04-28 HISTORY — DX: Hypo-osmolality and hyponatremia: E87.1

## 2024-04-28 LAB — CBC WITH DIFFERENTIAL/PLATELET
Basophils Absolute: 0 x10E3/uL (ref 0.0–0.2)
Basos: 1 %
EOS (ABSOLUTE): 0.4 x10E3/uL (ref 0.0–0.4)
Eos: 6 %
Hematocrit: 32.6 % — ABNORMAL LOW (ref 34.0–46.6)
Hemoglobin: 10.6 g/dL — ABNORMAL LOW (ref 11.1–15.9)
Lymphocytes Absolute: 1.4 x10E3/uL (ref 0.7–3.1)
Lymphs: 19 %
MCH: 27.5 pg (ref 26.6–33.0)
MCHC: 32.5 g/dL (ref 31.5–35.7)
MCV: 85 fL (ref 79–97)
Monocytes Absolute: 0.6 x10E3/uL (ref 0.1–0.9)
Monocytes: 8 %
Neutrophils Absolute: 4.7 x10E3/uL (ref 1.4–7.0)
Neutrophils: 66 %
Platelets: 458 x10E3/uL — ABNORMAL HIGH (ref 150–450)
RBC: 3.86 x10E6/uL (ref 3.77–5.28)
RDW: 13.1 % (ref 11.7–15.4)
WBC: 7.1 x10E3/uL (ref 3.4–10.8)

## 2024-04-28 LAB — COMPREHENSIVE METABOLIC PANEL WITH GFR
ALT: 9 IU/L (ref 0–35)
AST: 13 IU/L — ABNORMAL LOW (ref 15–59)
Albumin: 4.2 g/dL (ref 3.9–4.9)
Alkaline Phosphatase: 74 IU/L (ref 49–135)
BUN/Creatinine Ratio: 10 — ABNORMAL LOW (ref 12–28)
BUN: 9 mg/dL (ref 8–27)
Bilirubin Total: 0.4 mg/dL (ref 0.0–1.2)
CO2: 22 mmol/L (ref 20–29)
Calcium: 9.5 mg/dL (ref 8.7–10.3)
Chloride: 92 mmol/L — ABNORMAL LOW (ref 96–106)
Creatinine, Ser: 0.93 mg/dL (ref 0.57–1.00)
Globulin, Total: 2 g/dL (ref 1.5–4.5)
Glucose: 123 mg/dL — ABNORMAL HIGH (ref 70–99)
Potassium: 5 mmol/L (ref 3.5–5.2)
Sodium: 129 mmol/L — ABNORMAL LOW (ref 134–144)
Total Protein: 6.2 g/dL (ref 6.0–8.5)
eGFR: 67 mL/min/1.73 (ref >59)

## 2024-04-28 MED ORDER — SODIUM CHLORIDE 0.9 % IV BOLUS
1000.0000 mL | Freq: Once | INTRAVENOUS | Status: AC
  Administered 2024-04-28: 1000 mL via INTRAVENOUS

## 2024-04-28 MED ORDER — ONDANSETRON 4 MG PO TBDP: 4.0000 mg | ORAL_TABLET | Freq: Three times a day (TID) | ORAL | 0 refills | Status: AC | PRN

## 2024-04-28 MED ORDER — SODIUM CHLORIDE 1 G PO TABS: 1.0000 g | ORAL_TABLET | Freq: Three times a day (TID) | ORAL | 0 refills | Status: DC

## 2024-04-28 NOTE — ED Triage Notes (Signed)
 Pt arrived via POV following a PCP visit earlier today. Pt reports receiving a msg in her MyChart from her PCP advising her to come to the ER for IV fluids to replenish her low sodium levels. Pt endorses feeling very tired as well.

## 2024-04-28 NOTE — Progress Notes (Unsigned)
   Acute Office Visit  Subjective:     Patient ID: Cassie Hunter, female    DOB: 10-08-1955, 68 y.o.   MRN: [id number]  Chief Complaint  Patient presents with  . Nausea    HPI  History of Present Illness   Cassie Hunter is a 68 year old female who presents with nausea and inability to tolerate food.  Nausea and food intolerance - Onset yesterday - Nausea with inability to tolerate food - Able to consume only minimal amounts at breakfast, lunch, and dinner before onset of nausea - Dry heaving without emesis - Intolerant to the smell of certain foods - No Zofran  available at home, but recalls an old prescription for sublingual Zofran  - No fever, temperature 98.20F this morning - No medications taken today  Generalized weakness and gait instability - Feels 'wobbly' - Weakness in legs - No headache - No dizziness  Gastrointestinal and genitourinary symptoms - Increased urination, which is unusual for her - Four bowel movements since yesterday evening, most recent looser than usual - Some abdominal pain present  Hydration status - Attempting to stay hydrated with water, electrolyte packet, and Gatorade  Surgical history relevant to current symptoms - History of multiple abdominal surgeries: two C-sections, hysterectomy, and appendectomy      ROS      Objective:    BP 134/73   Pulse (!) 102   Temp 97.9 F (36.6 C) (Temporal)   Ht 5' 3 (1.6 m)   Wt 135 lb 9.6 oz (61.5 kg)   SpO2 100%   BMI 24.02 kg/m  Wt Readings from Last 3 Encounters:  04/28/24 135 lb 9.6 oz (61.5 kg)  04/22/24 138 lb 12.8 oz (63 kg)  04/17/24 139 lb 1.8 oz (63.1 kg)      Physical Exam  No results found for any visits on 04/28/24.      Assessment & Plan:    Assessment and Plan    Hyponatremia with associated nausea and poor oral intake Suspected low sodium levels, possibly due to adrenal insufficiency. Differential diagnosis includes adrenal insufficiency. -  Order serum sodium and cortisol levels. - Prescribe Zofran  for nausea. - Advise increased fluid intake with electrolytes. - Instruct to go to the ER if dizziness or severe headache occurs.  Left adrenal gland nodule Known benign fat cyst with slight growth since 2017. Potential relation to suspected adrenal insufficiency, but not confirmed. - Include adrenal gland nodule in referral diagnosis for endocrinology. - Refer to endocrinologist for further evaluation of adrenal insufficiency and nodule.       No orders of the defined types were placed in this encounter.   No follow-ups on file.  Cassie CHRISTELLA Search, FNP

## 2024-04-28 NOTE — Discharge Instructions (Addendum)
 Pleasure in care of you today.  You are seen for continued low sodium.  You had labs earlier today with your PCP that showed sodium 129.  You were given IV fluids here to help get this up.  Since you are already drinking Gatorade and electrolyte containing solutions at home and still having low sodium I am prescribing sodium chloride  tablets, you can start taking these and follow-up with your doctor in about a week for recheck of labs.  If you have any new or worsening symptoms such as confusion, trouble with balance, nausea or vomiting, seizures or any other worrisome changes come back to the ER right away.

## 2024-04-28 NOTE — ED Triage Notes (Signed)
 Pt reports taking zofran  apprx PTA. Pt reports decreased oral intake as well.

## 2024-04-28 NOTE — ED Provider Notes (Signed)
 [place] EMERGENCY DEPARTMENT AT [facility] Provider Note   CSN: [id number] Arrival date & time: 04/28/24  1302     Patient presents with: hyponatremia   Elta Maylynn Orzechowski is a 68 y.o. female. Striae of esophageal dysphagia, type 2 diabetes, aortic atherosclerosis, SVT, B12 deficiency.  She presents the ER today complaining of low sodium.  She had been following up with her PCP for this, has had low sodium for the past couple of weeks, has had blood work and today was having at serum cortisol level for a part of her workout but was saying she is feeling generally weak and was sent for IV fluids as her sodium levels 129 at the PCP office.  She denies fever or chills, no abdominal pain, no chest pain or shortness of breath you have some mild nausea and took Zofran  prior to arrival for this.  He notes due to her dysphagia she has a lot of trouble swallowing, she reports she does drink Gatorade and water mixed with electrolyte packets, her PCP told her to stop drinking regular water as she has done.  She states due to her difficulty swallowing she only drinks maybe 36 to 40 ounces per day.  She is awaiting GI follow-up at [facility] for repeat endoscopy for this.    HPI     Prior to Admission medications   Medication Sig Start Date End Date Taking? Authorizing Provider  albuterol  (VENTOLIN  HFA) 108 (90 Base) MCG/ACT inhaler Inhale 2 puffs into the lungs every 6 (six) hours as needed. 10/12/20   Merlynn Niki FALCON, FNP  Alcohol Swabs (DROPSAFE ALCOHOL PREP) 70 % PADS Test BS daily and as needed Dx E11.9 10/07/23   Joesph Annabella HERO, FNP  aspirin  81 MG EC tablet Take 1 tablet (81 mg total) by mouth daily. Swallow whole. 05/15/21   Merlynn Niki FALCON, FNP  Blood Glucose Calibration (TRUE METRIX LEVEL 1) Low SOLN Use with glucose monitor Dx E11.9 05/11/20   Dettinger, Fonda LABOR, MD  Blood Glucose Monitoring Suppl (TRUE METRIX AIR GLUCOSE METER) w/Device KIT Test BS daily and as  needed Dx E11.9 10/07/23   Joesph Annabella HERO, FNP  cyanocobalamin  (VITAMIN B12) 1000 MCG tablet Take 1,000 mcg by mouth daily.    [provider]  diltiazem  (CARDIZEM  CD) 240 MG 24 hr capsule Take 1 capsule (240 mg total) by mouth daily. 10/09/23   Verlin Lonni BIRCH, MD  diltiazem  (CARDIZEM ) 30 MG tablet Take 1 tablet (30 mg total) by mouth 4 (four) times daily as needed. 08/06/21   Verlin Lonni BIRCH, MD  glucose blood (TRUE METRIX BLOOD GLUCOSE TEST) test strip Test BS daily and as needed Dx E11.9 01/13/23   Joesph Annabella HERO, FNP  losartan  (COZAAR ) 100 MG tablet Take 1 tablet (100 mg total) by mouth daily. 03/22/24   Joesph Annabella HERO, FNP  meclizine  (ANTIVERT ) 25 MG tablet Take 1 tablet (25 mg total) by mouth 2 (two) times daily as needed for dizziness. 04/17/24   Freddi Hamilton, MD  metFORMIN  (GLUCOPHAGE ) 500 MG tablet TAKE 2 TABLETS DAILY WITH BREAKFAST AND 1 TABLET DAILY WITH SUPPER. 03/22/24   Joesph Annabella HERO, FNP  omeprazole  (PRILOSEC) 40 MG capsule Take 1 capsule (40 mg total) by mouth 2 (two) times daily. 03/22/24   Joesph Annabella HERO, FNP  ondansetron  (ZOFRAN -ODT) 4 MG disintegrating tablet Take 1 tablet (4 mg total) by mouth every 8 (eight) hours as needed for nausea or vomiting. 04/28/24   Joesph Annabella HERO,  FNP  simvastatin  (ZOCOR ) 20 MG tablet Take 1 tablet (20 mg total) by mouth daily at 6 PM. 03/22/24   Joesph Annabella HERO, FNP  TRUEplus Lancets 33G MISC Test BS daily and as needed Dx E11.9 10/07/23   Joesph Annabella HERO, FNP    Allergies: Codeine and Neosporin [bacitracin-polymyxin b]    Review of Systems  Updated Vital Signs BP (!) 142/65   Pulse (!) 104   Temp 99.2 F (37.3 C) (Oral)   Resp 18   Ht 5' 3 (1.6 m)   Wt 61.5 kg   SpO2 100%   BMI 24.02 kg/m   Physical Exam Vitals and nursing note reviewed.  Constitutional:      General: She is not in acute distress.    Appearance: She is well-developed.  HENT:     Head: Normocephalic and atraumatic.      Mouth/Throat:     Mouth: Mucous membranes are moist.  Eyes:     Extraocular Movements: Extraocular movements intact.     Conjunctiva/sclera: Conjunctivae normal.     Pupils: Pupils are equal, round, and reactive to light.  Cardiovascular:     Rate and Rhythm: Normal rate and regular rhythm.     Heart sounds: No murmur heard. Pulmonary:     Effort: Pulmonary effort is normal. No respiratory distress.     Breath sounds: Normal breath sounds.  Abdominal:     Palpations: Abdomen is soft.     Tenderness: There is no abdominal tenderness.  Musculoskeletal:        General: No swelling.     Cervical back: Neck supple.     Right lower leg: No edema.     Left lower leg: No edema.  Skin:    General: Skin is warm and dry.     Capillary Refill: Capillary refill takes less than 2 seconds.  Neurological:     General: No focal deficit present.     Mental Status: She is alert and oriented to person, place, and time.  Psychiatric:        Mood and Affect: Mood normal.     (all labs ordered are listed, but only abnormal results are displayed) Labs Reviewed - No data to display  EKG: None  Radiology: No results found.   Procedures   Medications Ordered in the ED  sodium chloride  0.9 % bolus 1,000 mL (has no administration in time range)                                    Medical Decision Making This patient presents to the ED for concern of low sodium, this involves an extensive number of treatment options, and is a complaint that carries with it a high risk of complications and morbidity.  The differential diagnosis includes dehydration, electrolyte disturbance, adrenal insufficiency, other   Co morbidities that complicate the patient evaluation  Type 2 diabetes, esophageal dysphagia   Additional history obtained:  Additional history obtained from EMR External records from outside source obtained and reviewed including visit notes, labs from today's PCP visit with sodium  129, baseline kidney function         Problem List / ED Course / Critical interventions / Medication management Patient was seen today for low sodium in the outpatient setting.  PCP felt she needed IV fluids.  After discussion patient does note decreased p.o. intake due to her dysphagia.  Encouraged to continue  with p.o. fluids, she is able to swallow but states she cannot swallow small amounts and since she has had such decreased p.o. over the past year her stomach cannot hold very much.  Discussed small frequent amounts of intake to goal about 80 ounces a day.  Since she is not eating food and has been having weight loss pending her endoscopy at [facility] I did discuss with her she could try buying over-the-counter boost supplements or ensure to help her maintain her weight and prevent muscle mass loss.  He has no neurologic deficits, she is not confused, heart rate is in the 90s.  She is agreeable plan of care to follow close with her PCP to continue her workup for possible adrenal insufficiency   I have reviewed the patients home medicines and have made adjustments as needed       Risk OTC drugs.        Final diagnoses:  None    ED Discharge Orders     None          Suellen Sherran LABOR, PA-C 04/28/24 1515    Kammerer, Megan L, DO 05/02/24 0301

## 2024-04-29 ENCOUNTER — Encounter: Payer: Self-pay | Admitting: Family Medicine

## 2024-04-29 DIAGNOSIS — E871 Hypo-osmolality and hyponatremia: Secondary | ICD-10-CM

## 2024-04-29 NOTE — Addendum Note (Signed)
 Addended by: RANDINE ARNULFO MATSU on: 04/29/2024 03:13 PM   Modules accepted: Orders

## 2024-04-30 LAB — CMP14+EGFR
ALT: 10 IU/L (ref 0–32)
AST: 15 IU/L (ref 0–40)
Albumin: 4.2 g/dL (ref 3.9–4.9)
Alkaline Phosphatase: 72 IU/L (ref 49–135)
BUN/Creatinine Ratio: 11 — ABNORMAL LOW (ref 12–28)
BUN: 10 mg/dL (ref 8–27)
Bilirubin Total: 0.2 mg/dL (ref 0.0–1.2)
CO2: 16 mmol/L — ABNORMAL LOW (ref 20–29)
Calcium: 9.6 mg/dL (ref 8.7–10.3)
Chloride: 93 mmol/L — ABNORMAL LOW (ref 96–106)
Creatinine, Ser: 0.89 mg/dL (ref 0.57–1.00)
Globulin, Total: 2.3 g/dL (ref 1.5–4.5)
Glucose: 125 mg/dL — ABNORMAL HIGH (ref 70–99)
Potassium: 5.2 mmol/L (ref 3.5–5.2)
Sodium: 133 mmol/L — ABNORMAL LOW (ref 134–144)
Total Protein: 6.5 g/dL (ref 6.0–8.5)
eGFR: 70 mL/min/1.73 (ref >59)

## 2024-04-30 LAB — CBC WITH DIFFERENTIAL/PLATELET

## 2024-04-30 LAB — CORTISOL: Cortisol: 35.8 ug/dL — ABNORMAL HIGH (ref 6.2–19.4)

## 2024-05-03 ENCOUNTER — Other Ambulatory Visit

## 2024-05-03 DIAGNOSIS — E871 Hypo-osmolality and hyponatremia: Secondary | ICD-10-CM

## 2024-05-04 LAB — BMP8+EGFR
BUN/Creatinine Ratio: 6 — ABNORMAL LOW (ref 12–28)
BUN: 5 mg/dL — ABNORMAL LOW (ref 8–27)
CO2: 20 mmol/L (ref 20–29)
Calcium: 9 mg/dL (ref 8.7–10.3)
Chloride: 97 mmol/L (ref 96–106)
Creatinine, Ser: 0.85 mg/dL (ref 0.57–1.00)
Glucose: 103 mg/dL — ABNORMAL HIGH (ref 70–99)
Potassium: 4.7 mmol/L (ref 3.5–5.2)
Sodium: 133 mmol/L — ABNORMAL LOW (ref 134–144)
eGFR: 74 mL/min/1.73 (ref >59)

## 2024-05-05 ENCOUNTER — Ambulatory Visit: Payer: Self-pay | Admitting: Family Medicine

## 2024-05-05 DIAGNOSIS — E871 Hypo-osmolality and hyponatremia: Secondary | ICD-10-CM

## 2024-05-05 DIAGNOSIS — R7989 Other specified abnormal findings of blood chemistry: Secondary | ICD-10-CM

## 2024-05-05 DIAGNOSIS — E279 Disorder of adrenal gland, unspecified: Secondary | ICD-10-CM

## 2024-05-05 DIAGNOSIS — R531 Weakness: Secondary | ICD-10-CM

## 2024-05-13 ENCOUNTER — Ambulatory Visit (INDEPENDENT_AMBULATORY_CARE_PROVIDER_SITE_OTHER): Admitting: Family Medicine

## 2024-05-13 ENCOUNTER — Encounter: Payer: Self-pay | Admitting: Family Medicine

## 2024-05-13 VITALS — BP 138/64 | HR 87 | Temp 98.4°F | Ht 63.0 in | Wt 134.6 lb

## 2024-05-13 DIAGNOSIS — I152 Hypertension secondary to endocrine disorders: Secondary | ICD-10-CM

## 2024-05-13 DIAGNOSIS — E871 Hypo-osmolality and hyponatremia: Secondary | ICD-10-CM | POA: Diagnosis not present

## 2024-05-13 DIAGNOSIS — R42 Dizziness and giddiness: Secondary | ICD-10-CM

## 2024-05-13 DIAGNOSIS — E1159 Type 2 diabetes mellitus with other circulatory complications: Secondary | ICD-10-CM

## 2024-05-13 NOTE — Patient Instructions (Addendum)
 [facility] [address] [place] [address] [phone]  [employer] sodium chloride  tablets on amazon

## 2024-05-13 NOTE — Progress Notes (Signed)
 Established Patient Office Visit  Subjective   Patient ID: Cassie Hunter, female    DOB: 04/21/1956  Age: 68 y.o. MRN: [id number]  Chief Complaint  Patient presents with   hyponatremia    HPI  History of Present Illness   Cassie Hunter is a 68 year old female who presents for a recheck of her sodium levels and follow-up on her endocrinology referral.  Hyponatremia management -  Was seen in ER for sodium repletion - Completed a ten-day course of salt tablets - Adds salt to food and has experimented with adding salt to recipes; husband has not noticed a change in taste - Occasional dizziness, unchanged - No weakness, nausea, vomiting, or confusion - Experiencing headache this afternoon, attributed to inadequate food and fluid intake today - Consumed less than sixteen ounces of fluid today  Recent fall and musculoskeletal symptoms - Sustained a fall on Saturday at her daughter's house, slipped while walking - No serious injuries - Experienced difficulty getting up after the fall        ROS As per HPI.    Objective:     BP 138/64   Pulse 87   Temp 98.4 F (36.9 C) (Temporal)   Ht 5' 3 (1.6 m)   Wt 134 lb 9.6 oz (61.1 kg)   SpO2 100%   BMI 23.84 kg/m  Wt Readings from Last 3 Encounters:  05/13/24 134 lb 9.6 oz (61.1 kg)  04/28/24 135 lb 9.6 oz (61.5 kg)  04/28/24 135 lb 9.6 oz (61.5 kg)      Physical Exam Vitals and nursing note reviewed.  Constitutional:      General: She is not in acute distress.    Appearance: Normal appearance. She is not ill-appearing, toxic-appearing or diaphoretic.  Cardiovascular:     Rate and Rhythm: Regular rhythm.     Heart sounds: Normal heart sounds. No murmur heard. Pulmonary:     Effort: Pulmonary effort is normal. No respiratory distress.     Breath sounds: Normal breath sounds. No wheezing or rhonchi.  Abdominal:     General: Bowel sounds are normal. There is no distension.     Palpations: Abdomen is  soft.     Tenderness: There is no abdominal tenderness. There is no guarding or rebound.  Musculoskeletal:     Cervical back: No rigidity.     Right lower leg: No edema.     Left lower leg: No edema.  Skin:    General: Skin is warm and dry.  Neurological:     General: No focal deficit present.     Mental Status: She is alert and oriented to person, place, and time.     Motor: No weakness.     Gait: Gait normal.  Psychiatric:        Mood and Affect: Mood normal.        Behavior: Behavior normal.      No results found for any visits on 05/13/24.    The ASCVD Risk score (Arnett DK, et al., 2019) failed to calculate for the following reasons:   The valid HDL cholesterol range is 20 to 100 mg/dL    Assessment & Plan:   Adellyn was seen today for hyponatremia.  Diagnoses and all orders for this visit:  Hyponatremia -     BMP8+EGFR  Dizziness  Hypertension associated with diabetes (HCC)   Assessment and Plan    Hyponatremia Hyponatremia. No longer taking salt tablets. She is adding salt to food  and fluids.  - Recheck sodium level today. - Consider continuation of salt pills based on sodium level results. - Contact information provided for endocrinology referral for further evaluation - No acute symptoms.     Dizziness Reports stable. Continue meclizine  prn.   HTN BP at goal.   Return if symptoms worsen or fail to improve.   The patient indicates understanding of these issues and agrees with the plan.  Cassie CHRISTELLA Search, FNP

## 2024-05-14 ENCOUNTER — Ambulatory Visit: Payer: Self-pay | Admitting: Family Medicine

## 2024-05-14 LAB — BMP8+EGFR
BUN/Creatinine Ratio: 6 — ABNORMAL LOW (ref 12–28)
BUN: 6 mg/dL — ABNORMAL LOW (ref 8–27)
CO2: 21 mmol/L (ref 20–29)
Calcium: 9 mg/dL (ref 8.7–10.3)
Chloride: 95 mmol/L — ABNORMAL LOW (ref 96–106)
Creatinine, Ser: 0.97 mg/dL (ref 0.57–1.00)
Glucose: 110 mg/dL — ABNORMAL HIGH (ref 70–99)
Potassium: 5 mmol/L (ref 3.5–5.2)
Sodium: 134 mmol/L (ref 134–144)
eGFR: 63 mL/min/1.73 (ref >59)

## 2024-05-20 DIAGNOSIS — H25813 Combined forms of age-related cataract, bilateral: Secondary | ICD-10-CM | POA: Diagnosis not present

## 2024-05-20 DIAGNOSIS — E119 Type 2 diabetes mellitus without complications: Secondary | ICD-10-CM | POA: Diagnosis not present

## 2024-05-20 LAB — OPHTHALMOLOGY REPORT-SCANNED

## 2024-05-28 ENCOUNTER — Ambulatory Visit: Admitting: Internal Medicine

## 2024-05-28 ENCOUNTER — Encounter: Payer: Self-pay | Admitting: Internal Medicine

## 2024-05-28 ENCOUNTER — Other Ambulatory Visit

## 2024-05-28 VITALS — BP 140/80 | HR 80 | Ht 63.0 in | Wt 135.0 lb

## 2024-05-28 DIAGNOSIS — D3502 Benign neoplasm of left adrenal gland: Secondary | ICD-10-CM | POA: Diagnosis not present

## 2024-05-28 DIAGNOSIS — E871 Hypo-osmolality and hyponatremia: Secondary | ICD-10-CM

## 2024-05-28 NOTE — Progress Notes (Signed)
 Name: Cassie Hunter  MRN/ DOB: [id number], 01-Sep-1955    Age/ Sex: 68 y.o., female    PCP: Joesph Annabella HERO, FNP   Reason for Endocrinology Evaluation: Elevated cortisol     Date of Initial Endocrinology Evaluation: 05/28/2024     HPI: Cassie Hunter is a 68 y.o. female with a past medical history of HTN, DM, GERD and dyslipidemia. The patient presented for initial endocrinology clinic visit on 05/28/2024 for consultative assistance with her elevated cortisol.   Patient was referred to endocrinology for further evaluation of elevated serum cortisol 35.8 UG/DL on 89/84/7974 (reference 2.3-11.9), this was done during evaluation of hyponatremia, at the time she was having nausea with dry heaving with no emesis  The patient presented to the ED for further evaluation of hyponatremia in October, 2025 with a serum sodium of 129 mEq/L She was feeling generally weak at the time.  The patient had completed 10-day course of salt tablets, she added salt to her food   In reviewing her records, the patient has been noted with intermittent hyponatremia since 2012 with a nadir of 116mmol/l in February, 2023 Of note, the patient has always had normal potassium except for 1 time it was slightly elevated at 5.53mmol/L in August, 2023   CT imaging of the head was unrevealing October, 2025  The patient is not on diuretic therapy  She is in OTC salt tablets twice daily  No glucocorticoid intake  She does headaches occasionally but over the past 2 months they have been worsening  Has pending cataract sx in 07/2024 Has noted vision changes  No LE edema  No anatacid, on PPI  No nausea  No constipation or diarrhea  Has a narrow esophagus , waiting on EGD at [facility] , is having to crush her tablets  No antidepressants No ETOH  No tobacco  Pt has noted weight loss ~ 50 lbs since 2019 due to lifestyle changes initially but than it happened without trying   She follows with  Cardiology for SVT , No CHF  She consumes 2 cups of coffee, no soda , 2 bottles of water with electrolytes and  and Gatorade .   ADRENAL HISTORY : The patient has been noted with left adrenal adenoma on imaging since 2017.  No prior evaluation for this.  Repeat imaging in 2023 showed slight increase in size   HISTORY:  Past Medical History:  Past Medical History:  Diagnosis Date   Adrenal insufficiency    Diabetes mellitus    Difficulty swallowing    Diverticulitis    Essential hypertension    GERD (gastroesophageal reflux disease)    History of esophageal stricture    Hyponatremia    Past Surgical History:  Past Surgical History:  Procedure Laterality Date   ABDOMINAL HYSTERECTOMY     APPENDECTOMY     CESAREAN SECTION  02/26/1988-07/01/1990   CHOLECYSTECTOMY     SPINAL CORD DECOMPRESSION  1996   TUBAL LIGATION  07/01/1990    Social History:  reports that she has never smoked. She has never been exposed to tobacco smoke. She has never used smokeless tobacco. She reports that she does not drink alcohol and does not use drugs. Family History: family history includes ADD / ADHD in her son; Aneurysm in her paternal grandfather; Asthma in her son; Birth defects in her sister; CVA in her mother; Cancer in her father; Heart attack in her maternal grandfather and maternal grandmother; Lung cancer in her father;  Pancreatic cancer in her father; Spina bifida in her sister; Stroke in her mother and paternal grandmother.   HOME MEDICATIONS: Allergies as of 05/28/2024       Reactions   Codeine Nausea Only, Other (See Comments)   Neosporin [bacitracin-polymyxin B] Rash        Medication List        Accurate as of May 28, 2024  7:11 AM. If you have any questions, ask your nurse or doctor.          albuterol  108 (90 Base) MCG/ACT inhaler Commonly known as: VENTOLIN  HFA Inhale 2 puffs into the lungs every 6 (six) hours as needed.   aspirin  EC 81 MG tablet Take 1 tablet  (81 mg total) by mouth daily. Swallow whole.   cyanocobalamin  1000 MCG tablet Commonly known as: VITAMIN B12 Take 1,000 mcg by mouth daily.   diltiazem  240 MG 24 hr capsule Commonly known as: CARDIZEM  CD Take 1 capsule (240 mg total) by mouth daily.   diltiazem  30 MG tablet Commonly known as: Cardizem  Take 1 tablet (30 mg total) by mouth 4 (four) times daily as needed.   DropSafe Alcohol Prep 70 % Pads Test BS daily and as needed Dx E11.9   losartan  100 MG tablet Commonly known as: COZAAR  Take 1 tablet (100 mg total) by mouth daily.   meclizine  25 MG tablet Commonly known as: ANTIVERT  Take 1 tablet (25 mg total) by mouth 2 (two) times daily as needed for dizziness.   metFORMIN  500 MG tablet Commonly known as: GLUCOPHAGE  TAKE 2 TABLETS DAILY WITH BREAKFAST AND 1 TABLET DAILY WITH SUPPER.   omeprazole  40 MG capsule Commonly known as: PRILOSEC Take 1 capsule (40 mg total) by mouth 2 (two) times daily.   ondansetron  4 MG disintegrating tablet Commonly known as: ZOFRAN -ODT Take 1 tablet (4 mg total) by mouth every 8 (eight) hours as needed for nausea or vomiting.   simvastatin  20 MG tablet Commonly known as: ZOCOR  Take 1 tablet (20 mg total) by mouth daily at 6 PM.   sodium chloride  1 g tablet Take 1 tablet (1 g total) by mouth 3 (three) times daily.   True Metrix Air Glucose Meter w/Device Kit Test BS daily and as needed Dx E11.9   True Metrix Blood Glucose Test test strip Generic drug: glucose blood Test BS daily and as needed Dx E11.9   True Metrix Level 1 Low Soln Use with glucose monitor Dx E11.9   TRUEplus Lancets 33G Misc Test BS daily and as needed Dx E11.9          REVIEW OF SYSTEMS: A comprehensive ROS was conducted with the patient and is negative except as per HPI     OBJECTIVE:  VS: BP (!) 140/80   Pulse 80   Ht 5' 3 (1.6 m)   Wt 135 lb (61.2 kg)   SpO2 99%   BMI 23.91 kg/m    Wt Readings from Last 3 Encounters:  05/13/24 134 lb  9.6 oz (61.1 kg)  04/28/24 135 lb 9.6 oz (61.5 kg)  04/28/24 135 lb 9.6 oz (61.5 kg)     EXAM: General: Pt appears well and is in NAD  Eyes: External eye exam normal without stare, lid lag or exophthalmos.  EOM intact.  PERRL.  Neck: General: Supple without adenopathy. Thyroid : Thyroid  size normal.  No goiter or nodules appreciated. No thyroid  bruit.  Lungs: Clear with good BS bilat   Heart: Auscultation: RRR.  Abdomen: Soft, nontender  Extremities:  BL LE: Trace pretibial edema   Mental Status: Judgment, insight: Intact Orientation: Oriented to time, place, and person Mood and affect: No depression, anxiety, or agitation     DATA REVIEWED:     Latest Reference Range & Units 05/28/24 09:02  Sodium 135 - 146 mmol/L 136  Potassium 3.5 - 5.3 mmol/L 4.5  Chloride 98 - 110 mmol/L 100  CO2 20 - 32 mmol/L 26  Glucose 65 - 99 mg/dL 895 (H)  BUN 7 - 25 mg/dL 8  Creatinine 9.49 - 8.94 mg/dL 9.34  Calcium 8.6 - 89.5 mg/dL 9.8  BUN/Creatinine Ratio 6 - 22 (calc) SEE NOTE:  eGFR > OR = 60 mL/min/1.51m2 95    Latest Reference Range & Units 05/28/24 09:02  Cortisol, Plasma mcg/dL 85.5      ASSESSMENT/PLAN/RECOMMENDATIONS:   Hyponatremia  -Hyponatremia represents a relative excess of water in relation to sodium. It can be induced by a marked increase in water intake (primary polydipsia) and/or by impaired water excretion due, for example, to advanced kidney failure or persistent release of antidiuretic hormone (ADH)   -The patient with lower extremity edema, which indicates low effective blood volume. Hyponatremia due to low effective arterial blood volume which is evidenced by peripheral edema, the cause of this is unclear to me, as I don't see Hx of CHF or Cirrhosis in the chart. Low effective arterial blood volume will cause release of ADH. Typically those patient do not respond very well to fluid restriction and it may be necessary to treat the underlying cause of the edema.  -  Her picture is currently muddied with all sodium intake - I would like to discontinue all the sodium intake and recheck her to have a better idea in interpreting her results - Repeat serum sodium is normal - Serum cortisol is normal   Recommendations: Discontinue salt tablets Discontinue Gatorade Discontinue electrolyte packets Drink 2 cups of coffee a day and to 16 ounce bottles a day only     2.  Elevated serum cortisol:  -Patient has no cushingoid features - We will eventually proceed with Cushing syndrome screening, with a either dexamethasone suppression test or 24-hour urinary cortisol.  I will just have to hold off on that until next visit.  In the meantime ACTH is pending - Serum cortisol is normal   3. Left adrenal adenoma:  -Patient with hyponatremia, unclear if this is related to adrenal adenoma - She also has SVT, will proceed with pheochromocytoma screening - Will proceed with Aldo, renin check - Will postpone screening for Cushing syndrome at this time  Signed electronically by: Stefano Redgie Butts, MD  [facility] Endocrinology  [facility] [address] [place] [place], [place] [address] Phone: [phone] FAX: [phone]   CC: Joesph Annabella HERO, FNP [address] [place] [address] Phone: [phone] Fax: [phone]   Return to Endocrinology clinic as below: Future Appointments  Date Time Provider Department Center  05/28/2024  8:30 AM Cassie Hunter, Donell Redgie, MD [facility] None  06/25/2024  9:00 AM Joesph Annabella HERO, FNP [facility] [address]  07/06/2024  2:00 PM Orman Erminio POUR, PA-C [facility] None  09/15/2024  9:20 AM Verlin Lonni BIRCH, MD [facility]  12/21/2024  9:20 AM [facility]LLNESS VISIT [facility] [address]

## 2024-05-28 NOTE — Patient Instructions (Addendum)
 STOP Gatorade STOP Salt tablets STOP Electrolytes   Continue with 2 cups of water  Continue 2 bottles of water NO electrolytes

## 2024-05-31 ENCOUNTER — Ambulatory Visit: Payer: Self-pay | Admitting: Internal Medicine

## 2024-06-07 ENCOUNTER — Encounter: Payer: Self-pay | Admitting: Internal Medicine

## 2024-06-07 ENCOUNTER — Ambulatory Visit: Admitting: Internal Medicine

## 2024-06-07 ENCOUNTER — Other Ambulatory Visit

## 2024-06-07 VITALS — BP 138/64 | Ht 63.0 in | Wt 133.0 lb

## 2024-06-07 DIAGNOSIS — D3502 Benign neoplasm of left adrenal gland: Secondary | ICD-10-CM | POA: Diagnosis not present

## 2024-06-07 DIAGNOSIS — E871 Hypo-osmolality and hyponatremia: Secondary | ICD-10-CM | POA: Diagnosis not present

## 2024-06-07 NOTE — Progress Notes (Unsigned)
 Name: Cassie Hunter  MRN/ DOB: [id number], [birth year]    Age/ Sex: 68 y.o., female    PCP: Joesph Annabella HERO, FNP   Reason for Endocrinology Evaluation: Elevated cortisol/Hyponatremia      Date of Initial Endocrinology Evaluation: 05/28/2024    HPI: Cassie Hunter is a 68 y.o. female with a past medical history of HTN, DM, GERD and dyslipidemia. The patient presented for initial endocrinology clinic visit on 05/28/2024 for consultative assistance with her elevated cortisol.   Patient was referred to endocrinology for further evaluation of elevated serum cortisol 35.8 UG/DL on 89/84/7974 (reference 2.3-11.9), this was done during evaluation of hyponatremia, at the time she was having nausea with dry heaving with no emesis  The patient presented to the ED for further evaluation of hyponatremia in October, 2025 with a serum sodium of 129 mEq/L She was feeling generally weak at the time.   In reviewing her records, the patient has been noted with intermittent hyponatremia since 2012 with a nadir of 172mmol/l in February, 2023 Of note, the patient has always had normal potassium except for 1 time it was slightly elevated at 5.16mmol/L in August, 2023   CT imaging of the head was unrevealing October, 2025   On her initial visit to our clinic she was NOT  on any diuretic therapy, but she was consuming salt tablets twice daily, in addition to consuming 2 cups of coffee, 2 bottles of water with added electrolytes, as well as Gatorade.   I was unable to proceed with serum/urine osmolality nor 24-hour urine sodium as the patient had high sodium intake, she was noted with lower extremity edema as well as elevated BP during her initial visit and she was advised to discontinue all extra salt supplements  She follows with Cardiology for SVT , No CHF  Her sodium levels normalized while on extra salt intake  ADRENAL HISTORY : The patient has been noted with left adrenal  adenoma on imaging since 2017.  No prior evaluation for this.  Repeat imaging in 2023 showed slight increase in size   ACTH  was at the lower end of normal at 6 PG/mL, serum cortisol 14.4 mcg/DL, as well as normal metanephrines and normetanephrine's in November, 2025   SUBJECTIVE:    Today (06/07/24):Cassie Hunter is here for follow-up on hyponatremia.  Aldosterone, renin continue to be pending  She is scheduled for cataract surgery in January, 2026 She continues to be on PPI, she does follow with GI, she was advised to have EGD with dysphagia dilatation but she has not been able to make it to [facility], which has resulted in decreased oral intake and weight loss  She is having a headache this morning, that she attributes to sun exposure and cataract.  Has occasional dysphagia, eats small bites  No nausea  No dizziness  No LE weakness No muscle cramps  No constipation or diarrhea No LE edema    The patient consumes 2 cups of coffee and 2 water bottles, no electrolytes, no salt tablets, and no Gatorade   HISTORY:  Past Medical History:  Past Medical History:  Diagnosis Date   Adrenal insufficiency    Diabetes mellitus    Difficulty swallowing    Diverticulitis    Essential hypertension    GERD (gastroesophageal reflux disease)    History of esophageal stricture    Hyponatremia    Past Surgical History:  Past Surgical History:  Procedure Laterality Date   ABDOMINAL HYSTERECTOMY  APPENDECTOMY     CESAREAN SECTION  02/26/1988-07/01/1990   CHOLECYSTECTOMY     SPINAL CORD DECOMPRESSION  1996   TUBAL LIGATION  07/01/1990    Social History:  reports that she has never smoked. She has never been exposed to tobacco smoke. She has never used smokeless tobacco. She reports that she does not drink alcohol and does not use drugs. Family History: family history includes ADD / ADHD in her son; Aneurysm in her paternal grandfather; Asthma in her son; Birth defects in  her sister; CVA in her mother; Cancer in her father; Heart attack in her maternal grandfather and maternal grandmother; Lung cancer in her father; Pancreatic cancer in her father; Spina bifida in her sister; Stroke in her mother and paternal grandmother.   HOME MEDICATIONS: Allergies as of 06/07/2024       Reactions   Codeine Nausea Only, Other (See Comments)   Neosporin [bacitracin-polymyxin B] Rash        Medication List        Accurate as of June 07, 2024  9:10 AM. If you have any questions, ask your nurse or doctor.          albuterol  108 (90 Base) MCG/ACT inhaler Commonly known as: VENTOLIN  HFA Inhale 2 puffs into the lungs every 6 (six) hours as needed.   aspirin  EC 81 MG tablet Take 1 tablet (81 mg total) by mouth daily. Swallow whole.   cyanocobalamin  1000 MCG tablet Commonly known as: VITAMIN B12 Take 1,000 mcg by mouth daily.   diltiazem  240 MG 24 hr capsule Commonly known as: CARDIZEM  CD Take 1 capsule (240 mg total) by mouth daily.   diltiazem  30 MG tablet Commonly known as: Cardizem  Take 1 tablet (30 mg total) by mouth 4 (four) times daily as needed.   DropSafe Alcohol Prep 70 % Pads Test BS daily and as needed Dx E11.9   losartan  100 MG tablet Commonly known as: COZAAR  Take 1 tablet (100 mg total) by mouth daily.   meclizine  25 MG tablet Commonly known as: ANTIVERT  Take 1 tablet (25 mg total) by mouth 2 (two) times daily as needed for dizziness.   metFORMIN  500 MG tablet Commonly known as: GLUCOPHAGE  TAKE 2 TABLETS DAILY WITH BREAKFAST AND 1 TABLET DAILY WITH SUPPER.   omeprazole  40 MG capsule Commonly known as: PRILOSEC Take 1 capsule (40 mg total) by mouth 2 (two) times daily.   ondansetron  4 MG disintegrating tablet Commonly known as: ZOFRAN -ODT Take 1 tablet (4 mg total) by mouth every 8 (eight) hours as needed for nausea or vomiting.   simvastatin  20 MG tablet Commonly known as: ZOCOR  Take 1 tablet (20 mg total) by mouth daily  at 6 PM.   sodium chloride  1 g tablet Take 1 tablet (1 g total) by mouth 3 (three) times daily.   True Metrix Air Glucose Meter w/Device Kit Test BS daily and as needed Dx E11.9   True Metrix Blood Glucose Test test strip Generic drug: glucose blood Test BS daily and as needed Dx E11.9   True Metrix Level 1 Low Soln Use with glucose monitor Dx E11.9   TRUEplus Lancets 33G Misc Test BS daily and as needed Dx E11.9          REVIEW OF SYSTEMS: A comprehensive ROS was conducted with the patient and is negative except as per HPI     OBJECTIVE:  VS: BP (!) 144/80   Ht 5' 3 (1.6 m)   Wt 133 lb (60.3  kg)   BMI 23.56 kg/m    Wt Readings from Last 3 Encounters:  06/07/24 133 lb (60.3 kg)  05/28/24 135 lb (61.2 kg)  05/13/24 134 lb 9.6 oz (61.1 kg)     EXAM: General: Pt appears well and is in NAD  Eyes: External eye exam normal without stare, lid lag or exophthalmos.  EOM intact.  PERRL.  Neck: General: Supple without adenopathy. Thyroid : Thyroid  size normal.  No goiter or nodules appreciated. No thyroid  bruit.  Lungs: Clear with good BS bilat   Heart: Auscultation: RRR.  Abdomen: Soft, nontender  Extremities:  BL LE: Resolved trace edema on the right, improved trace pretibial edema on the left  Mental Status: Judgment, insight: Intact Orientation: Oriented to time, place, and person Mood and affect: No depression, anxiety, or agitation     DATA REVIEWED:     Latest Reference Range & Units 05/28/24 09:02  Sodium 135 - 146 mmol/L 136  Potassium 3.5 - 5.3 mmol/L 4.5  Chloride 98 - 110 mmol/L 100  CO2 20 - 32 mmol/L 26  Glucose 65 - 99 mg/dL 895 (H)  BUN 7 - 25 mg/dL 8  Creatinine 9.49 - 8.94 mg/dL 9.34  Calcium 8.6 - 89.5 mg/dL 9.8  BUN/Creatinine Ratio 6 - 22 (calc) SEE NOTE:  eGFR > OR = 60 mL/min/1.7m2 95    Latest Reference Range & Units 05/28/24 09:02  Cortisol, Plasma mcg/dL 85.5      ASSESSMENT/PLAN/RECOMMENDATIONS:    Hyponatremia  -Hyponatremia represents a relative excess of water in relation to sodium. It can be induced by a marked increase in water intake (primary polydipsia) and/or by impaired water excretion due, for example, to advanced kidney failure or persistent release of antidiuretic hormone (ADH)   -The patient with lower extremity edema, which indicates low effective blood volume. Hyponatremia due to low effective arterial blood volume which is evidenced by peripheral edema, the cause of this is unclear to me, as I don't see Hx of CHF or Cirrhosis in the chart. Low effective arterial blood volume will cause release of ADH. Typically those patient do not respond very well to fluid restriction and it may be necessary to treat the underlying cause of the edema.  - Her picture is currently muddied with all sodium intake - I would like to discontinue all the sodium intake and recheck her to have a better idea in interpreting her results - Repeat serum sodium is normal - Serum cortisol is normal   Recommendations: Discontinue salt tablets Discontinue Gatorade Discontinue electrolyte packets Drink 2 cups of coffee a day and to 16 ounce bottles a day only     2.  Elevated serum cortisol:  -Patient has no cushingoid features - ACTH  was at the lower end of normal, with normal serum cortisol  3. Left adrenal adenoma:  -Patient with hyponatremia, unclear if this is related to adrenal adenoma -Pheochromocytoma screening was negative November, 2025 -Aldo, renin, Aldo/PRA ratio pending - Will postpone screening for Cushing syndrome at this time  Signed electronically by: Stefano Redgie Butts, MD  [facility] Endocrinology  [facility] [address] [place] [place], [place] [address] Phone: [phone] FAX: [phone]   CC: Joesph Annabella HERO, FNP [address] [place] [address] Phone: [phone] Fax: [phone]   Return to Endocrinology clinic as  below: Future Appointments  Date Time Provider Department Center  06/25/2024  9:00 AM Joesph Annabella HERO, FNP [facility] [address]  07/06/2024  2:00 PM [place],  Erminio POUR, PA-C [facility] None  09/15/2024  9:20 AM Verlin Lonni BIRCH, MD [facility] H&V  12/21/2024  9:20 AM [facility]ANNUAL WELLNESS VISIT [facility] [address]

## 2024-06-08 ENCOUNTER — Ambulatory Visit: Payer: Self-pay | Admitting: Internal Medicine

## 2024-06-08 LAB — BASIC METABOLIC PANEL WITH GFR
BUN: 11 mg/dL (ref 7–25)
CO2: 26 mmol/L (ref 20–32)
Calcium: 9.6 mg/dL (ref 8.6–10.4)
Chloride: 98 mmol/L (ref 98–110)
Creat: 0.81 mg/dL (ref 0.50–1.05)
Glucose, Bld: 101 mg/dL — ABNORMAL HIGH (ref 65–99)
Potassium: 5 mmol/L (ref 3.5–5.3)
Sodium: 133 mmol/L — ABNORMAL LOW (ref 135–146)
eGFR: 79 mL/min/1.73m2 (ref >=60)

## 2024-06-08 LAB — OSMOLALITY: Osmolality: 276 mosm/kg — ABNORMAL LOW (ref 278–305)

## 2024-06-08 LAB — SODIUM, URINE, RANDOM: Sodium, Ur: 59 mmol/L (ref 28–272)

## 2024-06-08 LAB — OSMOLALITY, URINE: Osmolality, Ur: 435 mosm/kg (ref 50–1200)

## 2024-06-15 ENCOUNTER — Encounter: Payer: Self-pay | Admitting: Family Medicine

## 2024-06-15 LAB — BASIC METABOLIC PANEL WITH GFR
BUN: 8 mg/dL (ref 7–25)
CO2: 26 mmol/L (ref 20–32)
Calcium: 9.8 mg/dL (ref 8.6–10.4)
Chloride: 100 mmol/L (ref 98–110)
Creat: 0.65 mg/dL (ref 0.50–1.05)
Glucose, Bld: 104 mg/dL — ABNORMAL HIGH (ref 65–99)
Potassium: 4.5 mmol/L (ref 3.5–5.3)
Sodium: 136 mmol/L (ref 135–146)
eGFR: 95 mL/min/1.73m2 (ref >=60)

## 2024-06-15 LAB — ACTH: C206 ACTH: 6 pg/mL (ref 6–50)

## 2024-06-15 LAB — METANEPHRINES, PLASMA
Metanephrine, Free: 28 pg/mL (ref <=57)
Normetanephrine, Free: 116 pg/mL (ref <=148)
Total Metanephrines-Plasma: 144 pg/mL (ref <=205)

## 2024-06-15 LAB — ALDOSTERONE + RENIN ACTIVITY W/ RATIO
ALDO / PRA Ratio: 2.2 ratio (ref 0.9–28.9)
Aldosterone: 1 ng/dL
Renin Activity: 0.46 ng/mL/h (ref 0.25–5.82)

## 2024-06-15 LAB — CORTISOL: Cortisol, Plasma: 14.4 ug/dL

## 2024-06-25 ENCOUNTER — Ambulatory Visit: Payer: Self-pay | Admitting: Family Medicine

## 2024-06-25 ENCOUNTER — Encounter: Payer: Self-pay | Admitting: Family Medicine

## 2024-06-25 ENCOUNTER — Ambulatory Visit (INDEPENDENT_AMBULATORY_CARE_PROVIDER_SITE_OTHER)

## 2024-06-25 VITALS — BP 120/63 | HR 77 | Temp 98.0°F | Ht 63.0 in | Wt 133.2 lb

## 2024-06-25 DIAGNOSIS — E1159 Type 2 diabetes mellitus with other circulatory complications: Secondary | ICD-10-CM

## 2024-06-25 DIAGNOSIS — E538 Deficiency of other specified B group vitamins: Secondary | ICD-10-CM

## 2024-06-25 DIAGNOSIS — E1169 Type 2 diabetes mellitus with other specified complication: Secondary | ICD-10-CM

## 2024-06-25 DIAGNOSIS — E1165 Type 2 diabetes mellitus with hyperglycemia: Secondary | ICD-10-CM | POA: Diagnosis not present

## 2024-06-25 DIAGNOSIS — Z780 Asymptomatic menopausal state: Secondary | ICD-10-CM

## 2024-06-25 DIAGNOSIS — R634 Abnormal weight loss: Secondary | ICD-10-CM | POA: Diagnosis not present

## 2024-06-25 DIAGNOSIS — R1319 Other dysphagia: Secondary | ICD-10-CM | POA: Diagnosis not present

## 2024-06-25 DIAGNOSIS — I152 Hypertension secondary to endocrine disorders: Secondary | ICD-10-CM | POA: Diagnosis not present

## 2024-06-25 DIAGNOSIS — E785 Hyperlipidemia, unspecified: Secondary | ICD-10-CM | POA: Diagnosis not present

## 2024-06-25 LAB — BAYER DCA HB A1C WAIVED: HB A1C (BAYER DCA - WAIVED): 5.6 % (ref 4.8–5.6)

## 2024-06-25 NOTE — Progress Notes (Signed)
 Established Patient Office Visit  Subjective   Patient ID: Cassie Hunter, female    DOB: 11-04-1955  Age: 68 y.o. MRN: [id number]  Chief Complaint  Patient presents with   Medical Management of Chronic Issues    HPI  History of Present Illness   Cassie Hunter is a 68 year old female with type 2 diabetes who presents with concerns about weight loss and low sodium levels.  Unintentional weight loss - No further weight loss since June 07, 2024; current weight unchanged since that date - Difficulty eating due to esophageal dysphagia and concern for inadequate nutrient intake - Eats small amounts frequently throughout the day ('grazing') - Perceives eating a lot but unable to gain weight  Esophageal dysphagia - requesting referral to [facility] - last saw [facility] in 2023 [employer] sent her to a specialist with [facility] due to the severity of her dysphagia.  - She has been unable to complete endoscopy at [facility] as they were going to have to do the procedure 3x in the hospital and transportation/logistics has been an issue for her.  - requesting 2nd opinion, hoping that she could have the endoscopy done in [place]  Hyponatremia and fluid intake - Low sodium levels attributed to excessive water intake - Limits fluid intake to two bottles of water and two cups of coffee daily per endocrinology recommendations - No thirst beyond current fluid intake - Lower extremity swelling improved after reducing sodium intake  Type 2 diabetes mellitus - Stopped regular monitoring due to consistently good readings - Current hemoglobin A1c is 5.6  HLD - compliant with statin   HTN - compliant with medications - denies symptoms        ROS As per HPI.    Objective:     BP 120/63   Pulse 77   Temp 98 F (36.7 C) (Temporal)   Ht 5' 3 (1.6 m)   Wt 133 lb 3.2 oz (60.4 kg)   SpO2 100%   BMI 23.60 kg/m  Wt Readings from Last 3 Encounters:  06/25/24 133 lb 3.2 oz (60.4 kg)  06/07/24  133 lb (60.3 kg)  05/28/24 135 lb (61.2 kg)      Physical Exam Vitals and nursing note reviewed.  Constitutional:      General: She is not in acute distress.    Appearance: Normal appearance. She is not ill-appearing, toxic-appearing or diaphoretic.  Cardiovascular:     Rate and Rhythm: Normal rate and regular rhythm.     Heart sounds: Normal heart sounds. No murmur heard. Pulmonary:     Effort: Pulmonary effort is normal. No respiratory distress.     Breath sounds: Normal breath sounds. No wheezing or rhonchi.  Abdominal:     General: Bowel sounds are normal. There is no distension.     Palpations: Abdomen is soft.     Tenderness: There is no abdominal tenderness. There is no guarding or rebound.  Musculoskeletal:     Right lower leg: No edema.     Left lower leg: No edema.  Skin:    General: Skin is warm and dry.  Neurological:     General: No focal deficit present.     Mental Status: She is alert and oriented to person, place, and time.     Motor: No weakness.     Gait: Gait normal.  Psychiatric:        Mood and Affect: Mood normal.        Behavior: Behavior  normal.      No results found for any visits on 06/25/24.    The ASCVD Risk score (Arnett DK, et al., 2019) failed to calculate for the following reasons:   The valid HDL cholesterol range is 20 to 100 mg/dL    Assessment & Plan:   Vincenzina was seen today for medical management of chronic issues.  Diagnoses and all orders for this visit:  Type 2 diabetes mellitus with hyperglycemia, without long-term current use of insulin  (HCC) -     Bayer DCA Hb A1c Waived -     Microalbumin / creatinine urine ratio  Dyslipidemia associated with type 2 diabetes mellitus (HCC)  Hypertension associated with diabetes (HCC)  Esophageal dysphagia -     Ambulatory referral to Gastroenterology  Unintentional weight loss  Vitamin B12 deficiency -     Vitamin B12  Postmenopausal -     DG [facility] DEXA   Assessment and  Plan    Esophageal dysphagia Chronic esophageal dysphagia. Last saw [facility] in 2023 and was referred to [facility]. [facility] will not be cover with insurance starting in January. Seeking second opinion. Discussed weight loss due to poor PO intake related to esophageal dysphagia.  - Referred to [facility] - Instructed to fill out records release for previous GI specialist for transfer of records.  Type 2 diabetes mellitus Well-controlled with A1c of 5.6.  HTN Well controlled with current regimen  HLD Continue statin.   General Health Maintenance Discussion of potential DEXA scan to assess bone density. - Will arrange for DEXA scan.        Return in about 3 months (around 09/23/2024) for chronic follow up.   The patient indicates understanding of these issues and agrees with the plan.  Cassie CHRISTELLA Search, FNP

## 2024-06-26 LAB — MICROALBUMIN / CREATININE URINE RATIO
Creatinine, Urine: 188.2 mg/dL
Microalb/Creat Ratio: 8 mg/g{creat} (ref 0–29)
Microalbumin, Urine: 15.1 ug/mL

## 2024-06-26 LAB — VITAMIN B12: Vitamin B-12: 423 pg/mL (ref 232–1245)

## 2024-06-28 ENCOUNTER — Ambulatory Visit: Payer: Self-pay | Admitting: Family Medicine

## 2024-07-06 ENCOUNTER — Ambulatory Visit: Admitting: Physician Assistant

## 2024-07-22 ENCOUNTER — Ambulatory Visit: Admitting: "Endocrinology

## 2024-09-15 ENCOUNTER — Ambulatory Visit: Admitting: Cardiovascular Disease

## 2024-09-23 ENCOUNTER — Ambulatory Visit: Admitting: Family Medicine

## 2024-09-28 ENCOUNTER — Ambulatory Visit: Admitting: Internal Medicine

## 2024-12-20 ENCOUNTER — Encounter

## 2024-12-21 ENCOUNTER — Ambulatory Visit: Payer: Self-pay

## 2025-03-01 ENCOUNTER — Ambulatory Visit: Admitting: Physician Assistant
